# Patient Record
Sex: Male | Born: 1955
Health system: Southern US, Community
[De-identification: ages and names within clinical notes are randomized; demographics above are authoritative.]

## PROBLEM LIST (undated history)

## (undated) DIAGNOSIS — I10 Essential (primary) hypertension: Secondary | ICD-10-CM

## (undated) DIAGNOSIS — E119 Type 2 diabetes mellitus without complications: Secondary | ICD-10-CM

## (undated) DIAGNOSIS — K219 Gastro-esophageal reflux disease without esophagitis: Secondary | ICD-10-CM

## (undated) HISTORY — PX: NO PAST SURGERIES: SHX2092

---

## 2008-07-23 ENCOUNTER — Emergency Department: Payer: Self-pay | Admitting: Emergency Medicine

## 2016-10-31 DIAGNOSIS — Z23 Encounter for immunization: Secondary | ICD-10-CM | POA: Diagnosis not present

## 2016-11-27 DIAGNOSIS — H5213 Myopia, bilateral: Secondary | ICD-10-CM | POA: Diagnosis not present

## 2017-07-29 ENCOUNTER — Other Ambulatory Visit: Payer: Self-pay | Admitting: Internal Medicine

## 2017-07-29 DIAGNOSIS — R634 Abnormal weight loss: Secondary | ICD-10-CM | POA: Diagnosis not present

## 2017-07-30 DIAGNOSIS — R5381 Other malaise: Secondary | ICD-10-CM | POA: Diagnosis not present

## 2017-07-30 DIAGNOSIS — E119 Type 2 diabetes mellitus without complications: Secondary | ICD-10-CM | POA: Diagnosis not present

## 2017-07-30 DIAGNOSIS — I10 Essential (primary) hypertension: Secondary | ICD-10-CM | POA: Diagnosis not present

## 2017-07-30 DIAGNOSIS — Z125 Encounter for screening for malignant neoplasm of prostate: Secondary | ICD-10-CM | POA: Diagnosis not present

## 2017-07-31 ENCOUNTER — Ambulatory Visit
Admission: RE | Admit: 2017-07-31 | Discharge: 2017-07-31 | Disposition: A | Discharge status: Home / Self Care | Payer: BLUE CROSS/BLUE SHIELD | Source: Ambulatory Visit | Attending: Cardiology | Admitting: Cardiology

## 2017-07-31 ENCOUNTER — Ambulatory Visit
Admission: RE | Admit: 2017-07-31 | Discharge: 2017-07-31 | Disposition: A | Discharge status: Home / Self Care | Payer: BLUE CROSS/BLUE SHIELD | Source: Ambulatory Visit | Attending: Internal Medicine | Admitting: Internal Medicine

## 2017-07-31 ENCOUNTER — Other Ambulatory Visit: Payer: Self-pay | Admitting: Cardiology

## 2017-07-31 DIAGNOSIS — R634 Abnormal weight loss: Secondary | ICD-10-CM | POA: Diagnosis not present

## 2017-07-31 DIAGNOSIS — R918 Other nonspecific abnormal finding of lung field: Secondary | ICD-10-CM | POA: Insufficient documentation

## 2017-08-05 ENCOUNTER — Other Ambulatory Visit: Payer: Self-pay | Admitting: Internal Medicine

## 2017-08-05 DIAGNOSIS — R911 Solitary pulmonary nodule: Secondary | ICD-10-CM

## 2017-08-05 DIAGNOSIS — E119 Type 2 diabetes mellitus without complications: Secondary | ICD-10-CM | POA: Diagnosis not present

## 2017-08-05 DIAGNOSIS — D72819 Decreased white blood cell count, unspecified: Secondary | ICD-10-CM | POA: Diagnosis not present

## 2017-08-07 ENCOUNTER — Telehealth: Payer: Self-pay | Admitting: Gastroenterology

## 2017-08-07 NOTE — Telephone Encounter (Signed)
Patient is returning a call to schedule a colonoscopy °

## 2017-08-08 ENCOUNTER — Other Ambulatory Visit: Payer: Self-pay

## 2017-08-08 DIAGNOSIS — Z1211 Encounter for screening for malignant neoplasm of colon: Secondary | ICD-10-CM

## 2017-08-08 NOTE — Telephone Encounter (Signed)
Returned patients call for colonoscopy.  LVM for him to call back.  Thanks [employer]

## 2017-08-13 ENCOUNTER — Ambulatory Visit: Payer: BLUE CROSS/BLUE SHIELD

## 2017-08-15 DIAGNOSIS — E119 Type 2 diabetes mellitus without complications: Secondary | ICD-10-CM | POA: Diagnosis not present

## 2017-08-26 ENCOUNTER — Ambulatory Visit: Payer: BLUE CROSS/BLUE SHIELD | Admitting: Anesthesiology

## 2017-08-26 ENCOUNTER — Encounter
Admission: RE | Disposition: A | Discharge status: Home / Self Care | Payer: Self-pay | Source: Ambulatory Visit | Attending: Gastroenterology

## 2017-08-26 ENCOUNTER — Encounter: Payer: Self-pay | Admitting: *Deleted

## 2017-08-26 ENCOUNTER — Ambulatory Visit
Admission: RE | Admit: 2017-08-26 | Discharge: 2017-08-26 | Disposition: A | Discharge status: Home / Self Care | Payer: BLUE CROSS/BLUE SHIELD | Source: Ambulatory Visit | Attending: Gastroenterology | Admitting: Gastroenterology

## 2017-08-26 DIAGNOSIS — Z7984 Long term (current) use of oral hypoglycemic drugs: Secondary | ICD-10-CM | POA: Diagnosis not present

## 2017-08-26 DIAGNOSIS — D126 Benign neoplasm of colon, unspecified: Secondary | ICD-10-CM | POA: Diagnosis not present

## 2017-08-26 DIAGNOSIS — K621 Rectal polyp: Secondary | ICD-10-CM | POA: Diagnosis not present

## 2017-08-26 DIAGNOSIS — K644 Residual hemorrhoidal skin tags: Secondary | ICD-10-CM | POA: Insufficient documentation

## 2017-08-26 DIAGNOSIS — K635 Polyp of colon: Secondary | ICD-10-CM | POA: Diagnosis not present

## 2017-08-26 DIAGNOSIS — K649 Unspecified hemorrhoids: Secondary | ICD-10-CM | POA: Diagnosis not present

## 2017-08-26 DIAGNOSIS — E119 Type 2 diabetes mellitus without complications: Secondary | ICD-10-CM | POA: Diagnosis not present

## 2017-08-26 DIAGNOSIS — Z1211 Encounter for screening for malignant neoplasm of colon: Secondary | ICD-10-CM | POA: Diagnosis not present

## 2017-08-26 DIAGNOSIS — D125 Benign neoplasm of sigmoid colon: Secondary | ICD-10-CM | POA: Diagnosis not present

## 2017-08-26 HISTORY — PX: COLONOSCOPY WITH PROPOFOL: SHX5780

## 2017-08-26 LAB — GLUCOSE, CAPILLARY: Glucose-Capillary: 284 mg/dL — ABNORMAL HIGH (ref 70–99)

## 2017-08-26 SURGERY — COLONOSCOPY WITH PROPOFOL
Anesthesia: General

## 2017-08-26 MED ORDER — PROPOFOL 500 MG/50ML IV EMUL
INTRAVENOUS | Status: DC | PRN
Start: 2017-08-26 — End: 2017-08-26
  Administered 2017-08-26: 180 ug/kg/min via INTRAVENOUS

## 2017-08-26 MED ORDER — LIDOCAINE HCL (PF) 2 % IJ SOLN
INTRAMUSCULAR | Status: AC
  Filled 2017-08-26: qty 10

## 2017-08-26 MED ORDER — PHENYLEPHRINE HCL 10 MG/ML IJ SOLN
INTRAMUSCULAR | Status: DC | PRN
  Administered 2017-08-26 (×4): 100 ug via INTRAVENOUS

## 2017-08-26 MED ORDER — LIDOCAINE 2% (20 MG/ML) 5 ML SYRINGE
INTRAMUSCULAR | Status: DC | PRN
  Administered 2017-08-26: 40 mg via INTRAVENOUS

## 2017-08-26 MED ORDER — LIDOCAINE HCL URETHRAL/MUCOSAL 2 % EX GEL
CUTANEOUS | Status: AC
  Filled 2017-08-26: qty 10

## 2017-08-26 MED ORDER — SODIUM CHLORIDE 0.9 % IV SOLN
INTRAVENOUS | Status: DC
  Administered 2017-08-26 (×2): via INTRAVENOUS

## 2017-08-26 MED ORDER — FENTANYL CITRATE (PF) 100 MCG/2ML IJ SOLN
INTRAMUSCULAR | Status: AC
  Filled 2017-08-26: qty 2

## 2017-08-26 MED ORDER — PROPOFOL 500 MG/50ML IV EMUL
INTRAVENOUS | Status: AC
  Filled 2017-08-26: qty 50

## 2017-08-26 MED ORDER — EPHEDRINE SULFATE 50 MG/ML IJ SOLN
INTRAMUSCULAR | Status: AC
  Filled 2017-08-26: qty 1

## 2017-08-26 MED ORDER — FENTANYL CITRATE (PF) 100 MCG/2ML IJ SOLN
INTRAMUSCULAR | Status: DC | PRN
  Administered 2017-08-26 (×2): 50 ug via INTRAVENOUS

## 2017-08-26 MED ORDER — PROPOFOL 10 MG/ML IV BOLUS
INTRAVENOUS | Status: DC | PRN
  Administered 2017-08-26: 130 mg via INTRAVENOUS

## 2017-08-26 NOTE — Op Note (Signed)
[facility] Patient Name: Victor Arnold Procedure Date: 08/26/2017 1:54 PM MRN: [id number] Account #: [id number] Date of Birth: 1956-01-21 Admit Type: Outpatient Age: 62 Room: [facility] ENDO ROOM 1 Gender: Male Note Status: Finalized Procedure:            Colonoscopy Indications:          Screening for colorectal malignant neoplasm, This is                        the patient's first colonoscopy Providers:            Lin Landsman MD, MD Medicines:            Monitored Anesthesia Care Complications:        No immediate complications. Estimated blood loss: None. Procedure:            Pre-Anesthesia Assessment:                       - Prior to the procedure, a History and Physical was                        performed, and patient medications and allergies were                        reviewed. The patient is competent. The risks and                        benefits of the procedure and the sedation options and                        risks were discussed with the patient. All questions                        were answered and informed consent was obtained.                        Patient identification and proposed procedure were                        verified by the physician, the nurse, the                        anesthesiologist, the anesthetist and the technician in                        the pre-procedure area in the procedure room in the                        endoscopy suite. Mental Status Examination: alert and                        oriented. Airway Examination: normal oropharyngeal                        airway and neck mobility. Respiratory Examination:                        clear to auscultation. CV Examination: normal.  Prophylactic Antibiotics: The patient does not require                        prophylactic antibiotics. Prior Anticoagulants: The                        patient has taken no previous anticoagulant or                      antiplatelet agents. ASA Grade Assessment: II - A                        patient with mild systemic disease. After reviewing the                        risks and benefits, the patient was deemed in                        satisfactory condition to undergo the procedure. The                        anesthesia plan was to use monitored anesthesia care                        (MAC). Immediately prior to administration of                        medications, the patient was re-assessed for adequacy                        to receive sedatives. The heart rate, respiratory rate,                        oxygen saturations, blood pressure, adequacy of                        pulmonary ventilation, and response to care were                        monitored throughout the procedure. The physical status                        of the patient was re-assessed after the procedure.                       After obtaining informed consent, the colonoscope was                        passed under direct vision. Throughout the procedure,                        the patient's blood pressure, pulse, and oxygen                        saturations were monitored continuously. The                        Colonoscope was introduced through the anus and                        advanced to the  the cecum, identified by appendiceal                        orifice and ileocecal valve. The colonoscopy was                        performed without difficulty. The patient tolerated the                        procedure well. The quality of the bowel preparation                        was evaluated using the BBPS [facility] Bowel Preparation                        Scale) with scores of: Right Colon = 3, Transverse                        Colon = 3 and Left Colon = 3 (entire mucosa seen well                        with no residual staining, small fragments of stool or                        opaque liquid). The total BBPS score  equals 9. Findings:      Skin tags were found on perianal exam.      A 8 mm polyp was found in the distal sigmoid colon. The polyp was       pedunculated. The polyp was removed with a hot snare. Resection and       retrieval were complete.      A 6 mm polyp was found in the rectum. The polyp was sessile. The polyp       was removed with a hot snare. Resection and retrieval were complete.      External hemorrhoids were found during retroflexion. The hemorrhoids       were large. Impression:           - Perianal skin tags found on perianal exam.                       - One 8 mm polyp in the distal sigmoid colon, removed                        with a hot snare. Resected and retrieved.                       - One 6 mm polyp in the rectum, removed with a hot                        snare. Resected and retrieved.                       - External hemorrhoids. Recommendation:       - Discharge patient to home (with escort).                       - Diabetic (ADA) diet.                       -  Continue present medications.                       - Await pathology results.                       - Repeat colonoscopy in 3 - 5 years for surveillance. Procedure Code(s):    --- Professional ---                       [phone], Colonoscopy, flexible; with removal of tumor(s),                        polyp(s), or other lesion(s) by snare technique Diagnosis Code(s):    --- Professional ---                       K64.4, Residual hemorrhoidal skin tags                       Z12.11, Encounter for screening for malignant neoplasm                        of colon                       D12.5, Benign neoplasm of sigmoid colon                       K62.1, Rectal polyp CPT copyright 2017 American Medical Association. All rights reserved. The codes documented in this report are preliminary and upon coder review may  be revised to meet current compliance requirements. Dr. Ulyess Mort Lin Landsman MD, MD 08/26/2017  2:25:19 PM This report has been signed electronically. Number of Addenda: 0 Note Initiated On: 08/26/2017 1:54 PM Scope Withdrawal Time: 0 hours 17 minutes 6 seconds  Total Procedure Duration: 0 hours 21 minutes 26 seconds       [facility]

## 2017-08-26 NOTE — Transfer of Care (Signed)
Immediate Anesthesia Transfer of Care Note  Patient: Victor Arnold.  Procedure(s) Performed: COLONOSCOPY WITH PROPOFOL (N/A )  Patient Location: PACU and Endoscopy Unit  Anesthesia Type:General  Level of Consciousness: sedated  Airway & Oxygen Therapy: Patient Spontanous Breathing and Patient connected to nasal cannula oxygen  Post-op Assessment: Report given to RN and Post -op Vital signs reviewed and stable  Post vital signs: Reviewed and stable  Last Vitals:  Vitals Value Taken Time  BP 90/69 08/26/2017  2:33 PM  Temp 36.2 C 08/26/2017  2:31 PM  Pulse 62 08/26/2017  2:33 PM  Resp 6 08/26/2017  2:33 PM  SpO2 100 % 08/26/2017  2:33 PM  Vitals shown include unvalidated device data.  Last Pain:  Vitals:   08/26/17 1431  TempSrc: Tympanic  PainSc: 0-No pain         Complications: No apparent anesthesia complications

## 2017-08-26 NOTE — H&P (Signed)
  Victor Darby, MD [address]  [place]  [place], [place] [address]  Main: [phone]  Fax: [phone] Pager: [phone]  Primary Care Physician:  Cletis Athens, MD Primary Gastroenterologist:  Dr. Cephas Arnold  Pre-Procedure History & Physical: HPI:  Victor Arnold. is a 62 y.o. male is here for an colonoscopy.   History reviewed. No pertinent past medical history.  History reviewed. No pertinent surgical history.  Prior to Admission medications   Not on File    Allergies as of 08/08/2017  . (Not on File)    History reviewed. No pertinent family history.  Social History   Socioeconomic History  . Marital status: Single    Spouse name: Not on file  . Number of children: Not on file  . Years of education: Not on file  . Highest education level: Not on file  Occupational History  . Not on file  Social Needs  . Financial resource strain: Not on file  . Food insecurity:    Worry: Not on file    Inability: Not on file  . Transportation needs:    Medical: Not on file    Non-medical: Not on file  Tobacco Use  . Smoking status: Never Smoker  . Smokeless tobacco: Never Used  Substance and Sexual Activity  . Alcohol use: Not Currently  . Drug use: Not Currently  . Sexual activity: Not on file  Lifestyle  . Physical activity:    Days per week: Not on file    Minutes per session: Not on file  . Stress: Not on file  Relationships  . Social connections:    Talks on phone: Not on file    Gets together: Not on file    Attends religious service: Not on file    Active member of club or organization: Not on file    Attends meetings of clubs or organizations: Not on file    Relationship status: Not on file  . Intimate partner violence:    Fear of current or ex partner: Not on file    Emotionally abused: Not on file    Physically abused: Not on file    Forced sexual activity: Not on file  Other Topics Concern  . Not on file  Social History  Narrative  . Not on file    Review of Systems: See HPI, otherwise negative ROS  Physical Exam: BP 107/75   Pulse 93   Temp (!) 97 F (36.1 C) (Tympanic)   Resp 20   Ht 5\' 7"  (1.702 m)   Wt 183 lb (83 kg)   SpO2 100%   BMI 28.66 kg/m  General:   Alert,  pleasant and cooperative in NAD Head:  Normocephalic and atraumatic. Neck:  Supple; no masses or thyromegaly. Lungs:  Clear throughout to auscultation.    Heart:  Regular rate and rhythm. Abdomen:  Soft, nontender and nondistended. Normal bowel sounds, without guarding, and without rebound.   Neurologic:  Alert and  oriented x4;  grossly normal neurologically.  Impression/Plan: Victor Arnold. is here for an colonoscopy to be performed for colon cancer screening  Risks, benefits, limitations, and alternatives regarding  colonoscopy have been reviewed with the patient.  Questions have been answered.  All parties agreeable.   Sherri Sear, MD  08/26/2017, 1:32 PM

## 2017-08-26 NOTE — Anesthesia Preprocedure Evaluation (Signed)
Anesthesia Evaluation  Patient identified by MRN, date of birth, ID band Patient awake    Reviewed: Allergy & Precautions, H&P , NPO status , Patient's Chart, lab work & pertinent test results, reviewed documented beta blocker date and time   Airway Mallampati: III  TM Distance: >3 FB Neck ROM: full    Dental  (+) Partial Upper, Dental Advidsory Given   Pulmonary neg pulmonary ROS,           Cardiovascular Exercise Tolerance: Good negative cardio ROS       Neuro/Psych negative neurological ROS  negative psych ROS   GI/Hepatic negative GI ROS, Neg liver ROS,   Endo/Other  diabetes, Oral Hypoglycemic Agents  Renal/GU negative Renal ROS  negative genitourinary   Musculoskeletal   Abdominal   Peds  Hematology negative hematology ROS (+)   Anesthesia Other Findings History reviewed. No pertinent past medical history.   Reproductive/Obstetrics negative OB ROS                             Anesthesia Physical Anesthesia Plan  ASA: II  Anesthesia Plan: General   Post-op Pain Management:    Induction: Intravenous  PONV Risk Score and Plan: 2 and Propofol infusion and TIVA  Airway Management Planned: Nasal Cannula  Additional Equipment:   Intra-op Plan:   Post-operative Plan:   Informed Consent: I have reviewed the patients History and Physical, chart, labs and discussed the procedure including the risks, benefits and alternatives for the proposed anesthesia with the patient or authorized representative who has indicated his/her understanding and acceptance.   Dental Advisory Given  Plan Discussed with: Anesthesiologist, CRNA and Surgeon  Anesthesia Plan Comments:         Anesthesia Quick Evaluation

## 2017-08-26 NOTE — Anesthesia Post-op Follow-up Note (Signed)
Anesthesia QCDR form completed.        

## 2017-08-27 ENCOUNTER — Encounter: Payer: Self-pay | Admitting: Gastroenterology

## 2017-08-27 NOTE — Anesthesia Postprocedure Evaluation (Signed)
Anesthesia Post Note  Patient: Victor Arnold.  Procedure(s) Performed: COLONOSCOPY WITH PROPOFOL (N/A )  Patient location during evaluation: Endoscopy Anesthesia Type: General Level of consciousness: awake and alert Pain management: pain level controlled Vital Signs Assessment: post-procedure vital signs reviewed and stable Respiratory status: spontaneous breathing, nonlabored ventilation and respiratory function stable Cardiovascular status: blood pressure returned to baseline and stable Postop Assessment: no apparent nausea or vomiting Anesthetic complications: no     Last Vitals:  Vitals:   08/26/17 1451 08/26/17 1501  BP: 107/79 (!) 129/93  Pulse: 73 74  Resp: 16 15  Temp:    SpO2: 99% 100%    Last Pain:  Vitals:   08/26/17 1501  TempSrc:   PainSc: 0-No pain                 Alphonsus Sias

## 2017-08-29 ENCOUNTER — Encounter: Payer: Self-pay | Admitting: Gastroenterology

## 2017-08-29 LAB — SURGICAL PATHOLOGY

## 2017-09-11 DIAGNOSIS — K219 Gastro-esophageal reflux disease without esophagitis: Secondary | ICD-10-CM | POA: Diagnosis not present

## 2017-09-11 DIAGNOSIS — E785 Hyperlipidemia, unspecified: Secondary | ICD-10-CM | POA: Diagnosis not present

## 2017-09-11 DIAGNOSIS — R10819 Abdominal tenderness, unspecified site: Secondary | ICD-10-CM | POA: Diagnosis not present

## 2017-09-11 DIAGNOSIS — E119 Type 2 diabetes mellitus without complications: Secondary | ICD-10-CM | POA: Diagnosis not present

## 2017-09-25 DIAGNOSIS — E785 Hyperlipidemia, unspecified: Secondary | ICD-10-CM | POA: Diagnosis not present

## 2017-09-25 DIAGNOSIS — K219 Gastro-esophageal reflux disease without esophagitis: Secondary | ICD-10-CM | POA: Diagnosis not present

## 2017-09-25 DIAGNOSIS — E119 Type 2 diabetes mellitus without complications: Secondary | ICD-10-CM | POA: Diagnosis not present

## 2017-09-25 DIAGNOSIS — R10819 Abdominal tenderness, unspecified site: Secondary | ICD-10-CM | POA: Diagnosis not present

## 2017-10-20 DIAGNOSIS — E785 Hyperlipidemia, unspecified: Secondary | ICD-10-CM | POA: Diagnosis not present

## 2017-10-20 DIAGNOSIS — B0229 Other postherpetic nervous system involvement: Secondary | ICD-10-CM | POA: Diagnosis not present

## 2017-10-20 DIAGNOSIS — E1139 Type 2 diabetes mellitus with other diabetic ophthalmic complication: Secondary | ICD-10-CM | POA: Diagnosis not present

## 2017-10-20 DIAGNOSIS — B029 Zoster without complications: Secondary | ICD-10-CM | POA: Diagnosis not present

## 2017-10-27 DIAGNOSIS — H25041 Posterior subcapsular polar age-related cataract, right eye: Secondary | ICD-10-CM | POA: Diagnosis not present

## 2017-10-28 ENCOUNTER — Encounter: Payer: Self-pay | Admitting: *Deleted

## 2017-11-04 ENCOUNTER — Ambulatory Visit: Payer: BLUE CROSS/BLUE SHIELD | Admitting: Certified Registered Nurse Anesthetist

## 2017-11-04 ENCOUNTER — Encounter
Admission: RE | Disposition: A | Discharge status: Home / Self Care | Payer: Self-pay | Source: Ambulatory Visit | Attending: Ophthalmology

## 2017-11-04 ENCOUNTER — Ambulatory Visit
Admission: RE | Admit: 2017-11-04 | Discharge: 2017-11-04 | Disposition: A | Discharge status: Home / Self Care | Payer: BLUE CROSS/BLUE SHIELD | Source: Ambulatory Visit | Attending: Ophthalmology | Admitting: Ophthalmology

## 2017-11-04 ENCOUNTER — Other Ambulatory Visit: Payer: Self-pay

## 2017-11-04 DIAGNOSIS — E119 Type 2 diabetes mellitus without complications: Secondary | ICD-10-CM | POA: Diagnosis not present

## 2017-11-04 DIAGNOSIS — I10 Essential (primary) hypertension: Secondary | ICD-10-CM | POA: Insufficient documentation

## 2017-11-04 DIAGNOSIS — H25041 Posterior subcapsular polar age-related cataract, right eye: Secondary | ICD-10-CM | POA: Diagnosis not present

## 2017-11-04 DIAGNOSIS — H2511 Age-related nuclear cataract, right eye: Secondary | ICD-10-CM | POA: Diagnosis not present

## 2017-11-04 DIAGNOSIS — E7800 Pure hypercholesterolemia, unspecified: Secondary | ICD-10-CM | POA: Diagnosis not present

## 2017-11-04 DIAGNOSIS — Z7984 Long term (current) use of oral hypoglycemic drugs: Secondary | ICD-10-CM | POA: Insufficient documentation

## 2017-11-04 DIAGNOSIS — Z79899 Other long term (current) drug therapy: Secondary | ICD-10-CM | POA: Diagnosis not present

## 2017-11-04 DIAGNOSIS — K219 Gastro-esophageal reflux disease without esophagitis: Secondary | ICD-10-CM | POA: Insufficient documentation

## 2017-11-04 HISTORY — PX: CATARACT EXTRACTION W/PHACO: SHX586

## 2017-11-04 HISTORY — DX: Gastro-esophageal reflux disease without esophagitis: K21.9

## 2017-11-04 HISTORY — DX: Type 2 diabetes mellitus without complications: E11.9

## 2017-11-04 HISTORY — DX: Essential (primary) hypertension: I10

## 2017-11-04 LAB — GLUCOSE, CAPILLARY
Glucose-Capillary: 327 mg/dL — ABNORMAL HIGH (ref 70–99)
Glucose-Capillary: 304 mg/dL — ABNORMAL HIGH (ref 70–99)

## 2017-11-04 SURGERY — PHACOEMULSIFICATION, CATARACT, WITH IOL INSERTION
Anesthesia: Monitor Anesthesia Care | Site: Eye | Laterality: Right

## 2017-11-04 MED ORDER — TETRACAINE HCL 0.5 % OP SOLN
OPHTHALMIC | Status: AC
  Administered 2017-11-04: 1 [drp] via OPHTHALMIC
  Filled 2017-11-04: qty 4

## 2017-11-04 MED ORDER — SODIUM CHLORIDE 0.9 % IV SOLN
INTRAVENOUS | Status: DC
  Administered 2017-11-04: 09:00:00 via INTRAVENOUS

## 2017-11-04 MED ORDER — MOXIFLOXACIN HCL 0.5 % OP SOLN: 1.0000 [drp] | OPHTHALMIC | Status: DC | PRN

## 2017-11-04 MED ORDER — EPINEPHRINE PF 1 MG/ML IJ SOLN
INTRAOCULAR | Status: DC | PRN
  Administered 2017-11-04: 1 mL via OPHTHALMIC

## 2017-11-04 MED ORDER — NA CHONDROIT SULF-NA HYALURON 40-17 MG/ML IO SOLN
INTRAOCULAR | Status: DC | PRN
  Administered 2017-11-04: 1 mL via INTRAOCULAR

## 2017-11-04 MED ORDER — TETRACAINE HCL 0.5 % OP SOLN
1.0000 [drp] | OPHTHALMIC | Status: AC | PRN
  Administered 2017-11-04 (×3): 1 [drp] via OPHTHALMIC

## 2017-11-04 MED ORDER — CARBACHOL 0.01 % IO SOLN
INTRAOCULAR | Status: DC | PRN
  Administered 2017-11-04: 0.5 mL via INTRAOCULAR

## 2017-11-04 MED ORDER — POVIDONE-IODINE 5 % OP SOLN
OPHTHALMIC | Status: DC | PRN
  Administered 2017-11-04: 1 via OPHTHALMIC

## 2017-11-04 MED ORDER — LIDOCAINE HCL (PF) 4 % IJ SOLN
INTRAOCULAR | Status: DC | PRN
  Administered 2017-11-04: 2 mL via OPHTHALMIC

## 2017-11-04 MED ORDER — MOXIFLOXACIN HCL 0.5 % OP SOLN
OPHTHALMIC | Status: AC
  Filled 2017-11-04: qty 3

## 2017-11-04 MED ORDER — MIDAZOLAM HCL 2 MG/2ML IJ SOLN
INTRAMUSCULAR | Status: DC | PRN
  Administered 2017-11-04 (×2): 1 mg via INTRAVENOUS

## 2017-11-04 MED ORDER — MOXIFLOXACIN HCL 0.5 % OP SOLN
OPHTHALMIC | Status: DC | PRN
  Administered 2017-11-04: 0.2 mL via OPHTHALMIC

## 2017-11-04 MED ORDER — MIDAZOLAM HCL 2 MG/2ML IJ SOLN
INTRAMUSCULAR | Status: AC
  Filled 2017-11-04: qty 2

## 2017-11-04 MED ORDER — ARMC OPHTHALMIC DILATING DROPS
1.0000 "application " | OPHTHALMIC | Status: AC
  Administered 2017-11-04: 1 via OPHTHALMIC
  Administered 2017-11-04: 09:00:00 via OPHTHALMIC
  Administered 2017-11-04: 1 via OPHTHALMIC

## 2017-11-04 MED ORDER — ARMC OPHTHALMIC DILATING DROPS
OPHTHALMIC | Status: AC
  Filled 2017-11-04: qty 0.5

## 2017-11-04 MED ORDER — INSULIN ASPART 100 UNIT/ML ~~LOC~~ SOLN
SUBCUTANEOUS | Status: AC
  Filled 2017-11-04: qty 1

## 2017-11-04 MED ORDER — INSULIN ASPART 100 UNIT/ML ~~LOC~~ SOLN
5.0000 [IU] | Freq: Once | SUBCUTANEOUS | Status: AC
  Administered 2017-11-04: 5 [IU] via SUBCUTANEOUS

## 2017-11-04 MED ORDER — INSULIN ASPART 100 UNIT/ML ~~LOC~~ SOLN
SUBCUTANEOUS | Status: AC
  Administered 2017-11-04: 5 [IU] via SUBCUTANEOUS
  Filled 2017-11-04: qty 1

## 2017-11-04 SURGICAL SUPPLY — 16 items
GLOVE BIO SURGEON STRL SZ8 (GLOVE) ×3 IMPLANT
GLOVE BIOGEL M 6.5 STRL (GLOVE) ×3 IMPLANT
GLOVE SURG LX 8.0 MICRO (GLOVE) ×2
GLOVE SURG LX STRL 8.0 MICRO (GLOVE) ×1 IMPLANT
GOWN STRL REUS W/ TWL LRG LVL3 (GOWN DISPOSABLE) ×2 IMPLANT
GOWN STRL REUS W/TWL LRG LVL3 (GOWN DISPOSABLE) ×4
LABEL CATARACT MEDS ST (LABEL) ×3 IMPLANT
LENS IOL TECNIS ITEC 19.0 (Intraocular Lens) ×2 IMPLANT
PACK CATARACT (MISCELLANEOUS) ×3 IMPLANT
PACK CATARACT BRASINGTON LX (MISCELLANEOUS) ×3 IMPLANT
PACK EYE AFTER SURG (MISCELLANEOUS) ×3 IMPLANT
SOL BSS BAG (MISCELLANEOUS) ×3
SOLUTION BSS BAG (MISCELLANEOUS) ×1 IMPLANT
SYR 5ML LL (SYRINGE) ×3 IMPLANT
WATER STERILE IRR 250ML POUR (IV SOLUTION) ×3 IMPLANT
WIPE NON LINTING 3.25X3.25 (MISCELLANEOUS) ×3 IMPLANT

## 2017-11-04 NOTE — Anesthesia Postprocedure Evaluation (Signed)
Anesthesia Post Note  Patient: Jo Cerone.  Procedure(s) Performed: CATARACT EXTRACTION PHACO AND INTRAOCULAR LENS PLACEMENT (IOC) (Right Eye)  Patient location during evaluation: Short Stay Anesthesia Type: MAC Level of consciousness: awake and alert, oriented and patient cooperative Pain management: satisfactory to patient Vital Signs Assessment: post-procedure vital signs reviewed and stable Respiratory status: spontaneous breathing, respiratory function stable and nonlabored ventilation Cardiovascular status: blood pressure returned to baseline and stable Postop Assessment: no headache and no apparent nausea or vomiting Anesthetic complications: no     Last Vitals:  Vitals:   11/04/17 0826 11/04/17 0945  BP: (!) 131/91 115/73  Pulse: 84 90  Resp: 18 16  Temp: 36.4 C (!) 36.3 C  SpO2: 100% 100%    Last Pain:  Vitals:   11/04/17 0945  TempSrc:   PainSc: 0-No pain                 Eben Burow

## 2017-11-04 NOTE — Discharge Instructions (Signed)
Eye Surgery Discharge Instructions    Expect mild scratchy sensation or mild soreness. DO NOT RUB YOUR EYE!  The day of surgery:  Minimal physical activity, but bed rest is not required  No reading, computer work, or close hand work  No bending, lifting, or straining.  May watch TV  For 24 hours:  No driving, legal decisions, or alcoholic beverages  Safety precautions  Eat anything you prefer: It is better to start with liquids, then soup then solid foods.  _____ Eye patch should be worn until postoperative exam tomorrow.  ____ Solar shield eyeglasses should be worn for comfort in the sunlight/patch while sleeping  Resume all regular medications including aspirin or Coumadin if these were discontinued prior to surgery. You may shower, bathe, shave, or wash your hair. Tylenol may be taken for mild discomfort.  Call your doctor if you experience significant pain, nausea, or vomiting, fever > 101 or other signs of infection. [phone] or [phone] Specific instructions:  Follow-up Information    Birder Robson, MD Follow up.   Specialty:  Ophthalmology Why:  October 16 at 10:45am Contact information: [address] [place] [address] [phone]

## 2017-11-04 NOTE — OR Nursing (Signed)
Dr Randa Lynn  Made aware of pts BS orders received 5 units of insulin given.

## 2017-11-04 NOTE — Op Note (Signed)
PREOPERATIVE DIAGNOSIS:  Nuclear sclerotic cataract of the right eye.   POSTOPERATIVE DIAGNOSIS:  nuclear sclerotic cataract right eye   OPERATIVE PROCEDURE: Procedure(s): CATARACT EXTRACTION PHACO AND INTRAOCULAR LENS PLACEMENT (IOC)   SURGEON:  Birder Robson, MD.   ANESTHESIA:  Anesthesiologist: Emmie Niemann, MD CRNA: Eben Burow, CRNA  1.      Managed anesthesia care. 2.      0.14ml of Shugarcaine was instilled in the eye following the paracentesis.   COMPLICATIONS:  None.   TECHNIQUE:   Stop and chop   DESCRIPTION OF PROCEDURE:  The patient was examined and consented in the preoperative holding area where the aforementioned topical anesthesia was applied to the right eye and then brought back to the Operating Room where the right eye was prepped and draped in the usual sterile ophthalmic fashion and a lid speculum was placed. A paracentesis was created with the side port blade and the anterior chamber was filled with viscoelastic. A near clear corneal incision was performed with the steel keratome. A continuous curvilinear capsulorrhexis was performed with a cystotome followed by the capsulorrhexis forceps. Hydrodissection and hydrodelineation were carried out with BSS on a blunt cannula. The lens was removed in a stop and chop  technique and the remaining cortical material was removed with the irrigation-aspiration handpiece. The capsular bag was inflated with viscoelastic and the Technis [id number]  lens was placed in the capsular bag without complication. The remaining viscoelastic was removed from the eye with the irrigation-aspiration handpiece. The wounds were hydrated. The anterior chamber was flushed with Miostat and the eye was inflated to physiologic pressure. 0.69ml of Vigamox was placed in the anterior chamber. The wounds were found to be water tight. The eye was dressed with Vigamox. The patient was given protective glasses to wear throughout the day and a shield with which to  sleep tonight. The patient was also given drops with which to begin a drop regimen today and will follow-up with me in one day. Implant Name Type Inv. Item Serial No. Manufacturer Lot No. LRB No. Used  LENS IOL DIOP 19.0 - [id number] Intraocular Lens LENS IOL DIOP 19.0 [id number] AMO  Right 1   Procedure(s) with comments: CATARACT EXTRACTION PHACO AND INTRAOCULAR LENS PLACEMENT (IOC) (Right) - [place]  01:46 CDE 9.00 Fluid pack lot # [id number]  Electronically signed: Birder Robson 11/04/2017 9:43 AM

## 2017-11-04 NOTE — OR Nursing (Signed)
Discharge instructions discussed with pt and wife. Both voice understanding. 

## 2017-11-04 NOTE — Anesthesia Post-op Follow-up Note (Signed)
Anesthesia QCDR form completed.        

## 2017-11-04 NOTE — Anesthesia Preprocedure Evaluation (Signed)
Anesthesia Evaluation  Patient identified by MRN, date of birth, ID band Patient awake    Reviewed: Allergy & Precautions, NPO status , Patient's Chart, lab work & pertinent test results  History of Anesthesia Complications Negative for: history of anesthetic complications  Airway Mallampati: II  TM Distance: >3 FB Neck ROM: Full    Dental  (+) Partial Upper   Pulmonary neg sleep apnea, neg COPD,    breath sounds clear to auscultation- rhonchi (-) wheezing      Cardiovascular hypertension, Pt. on medications (-) CAD, (-) Past MI, (-) Cardiac Stents and (-) CABG  Rhythm:Regular Rate:Normal - Systolic murmurs and - Diastolic murmurs    Neuro/Psych negative neurological ROS  negative psych ROS   GI/Hepatic Neg liver ROS, GERD  ,  Endo/Other  diabetes, Oral Hypoglycemic Agents  Renal/GU negative Renal ROS     Musculoskeletal negative musculoskeletal ROS (+)   Abdominal (+) - obese,   Peds  Hematology negative hematology ROS (+)   Anesthesia Other Findings Past Medical History: No date: Diabetes mellitus without complication (HCC) No date: GERD (gastroesophageal reflux disease) No date: Hypertension   Reproductive/Obstetrics                             Anesthesia Physical Anesthesia Plan  ASA: II  Anesthesia Plan: MAC   Post-op Pain Management:    Induction: Intravenous  PONV Risk Score and Plan: 1 and Midazolam  Airway Management Planned: Natural Airway  Additional Equipment:   Intra-op Plan:   Post-operative Plan:   Informed Consent: I have reviewed the patients History and Physical, chart, labs and discussed the procedure including the risks, benefits and alternatives for the proposed anesthesia with the patient or authorized representative who has indicated his/her understanding and acceptance.     Plan Discussed with: CRNA and Anesthesiologist  Anesthesia Plan  Comments:         Anesthesia Quick Evaluation

## 2017-11-04 NOTE — H&P (Signed)
All labs reviewed. Abnormal studies sent to patients PCP when indicated.  Previous H&P reviewed, patient examined, there are NO CHANGES.  Victor Rankin Porfilio10/15/20199:03 AM

## 2017-11-04 NOTE — Transfer of Care (Signed)
Immediate Anesthesia Transfer of Care Note  Patient: Victor Arnold.  Procedure(s) Performed: CATARACT EXTRACTION PHACO AND INTRAOCULAR LENS PLACEMENT (IOC) (Right Eye)  Patient Location: Short Stay  Anesthesia Type:MAC  Level of Consciousness: awake, alert , oriented and patient cooperative  Airway & Oxygen Therapy: Patient Spontanous Breathing  Post-op Assessment: Report given to RN and Post -op Vital signs reviewed and stable  Post vital signs: Reviewed and stable  Last Vitals:  Vitals Value Taken Time  BP 115/73 11/04/2017  9:45 AM  Temp 36.3 C 11/04/2017  9:45 AM  Pulse 90 11/04/2017  9:45 AM  Resp 16 11/04/2017  9:45 AM  SpO2 100 % 11/04/2017  9:45 AM    Last Pain:  Vitals:   11/04/17 0945  TempSrc:   PainSc: 0-No pain         Complications: No apparent anesthesia complications

## 2017-11-20 DIAGNOSIS — K219 Gastro-esophageal reflux disease without esophagitis: Secondary | ICD-10-CM | POA: Diagnosis not present

## 2017-11-20 DIAGNOSIS — E119 Type 2 diabetes mellitus without complications: Secondary | ICD-10-CM | POA: Diagnosis not present

## 2017-11-20 DIAGNOSIS — E785 Hyperlipidemia, unspecified: Secondary | ICD-10-CM | POA: Diagnosis not present

## 2017-11-20 DIAGNOSIS — R10819 Abdominal tenderness, unspecified site: Secondary | ICD-10-CM | POA: Diagnosis not present

## 2017-11-21 DIAGNOSIS — H2512 Age-related nuclear cataract, left eye: Secondary | ICD-10-CM | POA: Diagnosis not present

## 2017-11-26 ENCOUNTER — Encounter: Payer: Self-pay | Admitting: *Deleted

## 2017-12-02 ENCOUNTER — Other Ambulatory Visit: Payer: Self-pay

## 2017-12-02 ENCOUNTER — Ambulatory Visit: Payer: BLUE CROSS/BLUE SHIELD | Admitting: Anesthesiology

## 2017-12-02 ENCOUNTER — Encounter: Payer: Self-pay | Admitting: Anesthesiology

## 2017-12-02 ENCOUNTER — Encounter
Admission: RE | Disposition: A | Discharge status: Home / Self Care | Payer: Self-pay | Source: Ambulatory Visit | Attending: Ophthalmology

## 2017-12-02 ENCOUNTER — Ambulatory Visit
Admission: RE | Admit: 2017-12-02 | Discharge: 2017-12-02 | Disposition: A | Discharge status: Home / Self Care | Payer: BLUE CROSS/BLUE SHIELD | Source: Ambulatory Visit | Attending: Ophthalmology | Admitting: Ophthalmology

## 2017-12-02 DIAGNOSIS — H2512 Age-related nuclear cataract, left eye: Secondary | ICD-10-CM | POA: Diagnosis not present

## 2017-12-02 DIAGNOSIS — Z9841 Cataract extraction status, right eye: Secondary | ICD-10-CM | POA: Diagnosis not present

## 2017-12-02 DIAGNOSIS — E7800 Pure hypercholesterolemia, unspecified: Secondary | ICD-10-CM | POA: Diagnosis not present

## 2017-12-02 DIAGNOSIS — Z7984 Long term (current) use of oral hypoglycemic drugs: Secondary | ICD-10-CM | POA: Diagnosis not present

## 2017-12-02 DIAGNOSIS — K219 Gastro-esophageal reflux disease without esophagitis: Secondary | ICD-10-CM | POA: Insufficient documentation

## 2017-12-02 DIAGNOSIS — E1136 Type 2 diabetes mellitus with diabetic cataract: Secondary | ICD-10-CM | POA: Diagnosis not present

## 2017-12-02 DIAGNOSIS — I10 Essential (primary) hypertension: Secondary | ICD-10-CM | POA: Insufficient documentation

## 2017-12-02 DIAGNOSIS — E119 Type 2 diabetes mellitus without complications: Secondary | ICD-10-CM | POA: Diagnosis not present

## 2017-12-02 DIAGNOSIS — Z79899 Other long term (current) drug therapy: Secondary | ICD-10-CM | POA: Insufficient documentation

## 2017-12-02 HISTORY — PX: CATARACT EXTRACTION W/PHACO: SHX586

## 2017-12-02 LAB — GLUCOSE, CAPILLARY: GLUCOSE-CAPILLARY: 241 mg/dL — AB (ref 70–99)

## 2017-12-02 SURGERY — PHACOEMULSIFICATION, CATARACT, WITH IOL INSERTION
Anesthesia: Monitor Anesthesia Care | Site: Eye | Laterality: Left

## 2017-12-02 MED ORDER — FENTANYL CITRATE (PF) 100 MCG/2ML IJ SOLN
INTRAMUSCULAR | Status: DC | PRN
  Administered 2017-12-02: 25 ug via INTRAVENOUS
  Administered 2017-12-02: 50 ug via INTRAVENOUS
  Administered 2017-12-02: 25 ug via INTRAVENOUS

## 2017-12-02 MED ORDER — SODIUM CHLORIDE 0.9 % IV SOLN
INTRAVENOUS | Status: DC
  Administered 2017-12-02: 12:00:00 via INTRAVENOUS

## 2017-12-02 MED ORDER — NA CHONDROIT SULF-NA HYALURON 40-17 MG/ML IO SOLN
INTRAOCULAR | Status: DC | PRN
  Administered 2017-12-02: 1 mL via INTRAOCULAR

## 2017-12-02 MED ORDER — TETRACAINE HCL 0.5 % OP SOLN
1.0000 [drp] | OPHTHALMIC | Status: DC | PRN
  Administered 2017-12-02: 1 [drp] via OPHTHALMIC

## 2017-12-02 MED ORDER — POVIDONE-IODINE 5 % OP SOLN
OPHTHALMIC | Status: AC
  Filled 2017-12-02: qty 30

## 2017-12-02 MED ORDER — EPINEPHRINE PF 1 MG/ML IJ SOLN
INTRAOCULAR | Status: DC | PRN
  Administered 2017-12-02: 12:00:00 via OPHTHALMIC

## 2017-12-02 MED ORDER — FENTANYL CITRATE (PF) 100 MCG/2ML IJ SOLN
INTRAMUSCULAR | Status: AC
  Filled 2017-12-02: qty 2

## 2017-12-02 MED ORDER — MOXIFLOXACIN HCL 0.5 % OP SOLN
OPHTHALMIC | Status: AC
  Filled 2017-12-02: qty 3

## 2017-12-02 MED ORDER — ARMC OPHTHALMIC DILATING DROPS
1.0000 "application " | OPHTHALMIC | Status: AC
  Administered 2017-12-02 (×3): 1 via OPHTHALMIC

## 2017-12-02 MED ORDER — EPINEPHRINE PF 1 MG/ML IJ SOLN
INTRAMUSCULAR | Status: AC
  Filled 2017-12-02: qty 1

## 2017-12-02 MED ORDER — POVIDONE-IODINE 5 % OP SOLN
OPHTHALMIC | Status: DC | PRN
  Administered 2017-12-02: 1 via OPHTHALMIC

## 2017-12-02 MED ORDER — MOXIFLOXACIN HCL 0.5 % OP SOLN: 1.0000 [drp] | OPHTHALMIC | Status: DC | PRN

## 2017-12-02 MED ORDER — LIDOCAINE HCL (PF) 4 % IJ SOLN
INTRAMUSCULAR | Status: AC
  Filled 2017-12-02: qty 5

## 2017-12-02 MED ORDER — ARMC OPHTHALMIC DILATING DROPS
OPHTHALMIC | Status: AC
  Administered 2017-12-02: 1 via OPHTHALMIC
  Filled 2017-12-02: qty 0.5

## 2017-12-02 MED ORDER — NA CHONDROIT SULF-NA HYALURON 40-17 MG/ML IO SOLN
INTRAOCULAR | Status: AC
  Filled 2017-12-02: qty 1

## 2017-12-02 MED ORDER — MOXIFLOXACIN HCL 0.5 % OP SOLN
OPHTHALMIC | Status: DC | PRN
  Administered 2017-12-02: 0.2 mL via OPHTHALMIC

## 2017-12-02 MED ORDER — TETRACAINE HCL 0.5 % OP SOLN
OPHTHALMIC | Status: AC
  Administered 2017-12-02: 1 [drp] via OPHTHALMIC
  Filled 2017-12-02: qty 4

## 2017-12-02 MED ORDER — LIDOCAINE HCL (PF) 4 % IJ SOLN
INTRAOCULAR | Status: DC | PRN
  Administered 2017-12-02: 4 mL via OPHTHALMIC

## 2017-12-02 MED ORDER — CARBACHOL 0.01 % IO SOLN
INTRAOCULAR | Status: DC | PRN
  Administered 2017-12-02: 0.5 mL via INTRAOCULAR

## 2017-12-02 SURGICAL SUPPLY — 16 items
GLOVE BIO SURGEON STRL SZ8 (GLOVE) ×3 IMPLANT
GLOVE BIOGEL M 6.5 STRL (GLOVE) ×3 IMPLANT
GLOVE SURG LX 8.0 MICRO (GLOVE) ×2
GLOVE SURG LX STRL 8.0 MICRO (GLOVE) ×1 IMPLANT
GOWN STRL REUS W/ TWL LRG LVL3 (GOWN DISPOSABLE) ×2 IMPLANT
GOWN STRL REUS W/TWL LRG LVL3 (GOWN DISPOSABLE) ×4
LABEL CATARACT MEDS ST (LABEL) ×3 IMPLANT
LENS IOL TECNIS ITEC 19.0 (Intraocular Lens) ×2 IMPLANT
PACK CATARACT (MISCELLANEOUS) ×3 IMPLANT
PACK CATARACT BRASINGTON LX (MISCELLANEOUS) ×3 IMPLANT
PACK EYE AFTER SURG (MISCELLANEOUS) ×3 IMPLANT
SOL BSS BAG (MISCELLANEOUS) ×3
SOLUTION BSS BAG (MISCELLANEOUS) ×1 IMPLANT
SYR 5ML LL (SYRINGE) ×3 IMPLANT
WATER STERILE IRR 250ML POUR (IV SOLUTION) ×3 IMPLANT
WIPE NON LINTING 3.25X3.25 (MISCELLANEOUS) ×3 IMPLANT

## 2017-12-02 NOTE — Anesthesia Post-op Follow-up Note (Signed)
Anesthesia QCDR form completed.        

## 2017-12-02 NOTE — H&P (Signed)
All labs reviewed. Abnormal studies sent to patients PCP when indicated.  Previous H&P reviewed, patient examined, there are NO CHANGES.  Victor Gracy Porfilio11/12/201912:06 PM

## 2017-12-02 NOTE — Anesthesia Preprocedure Evaluation (Signed)
Anesthesia Evaluation  Patient identified by MRN, date of birth, ID band Patient awake    Reviewed: Allergy & Precautions, NPO status , Patient's Chart, lab work & pertinent test results, reviewed documented beta blocker date and time   Airway Mallampati: II  TM Distance: >3 FB     Dental  (+) Chipped   Pulmonary           Cardiovascular hypertension, Pt. on medications      Neuro/Psych    GI/Hepatic   Endo/Other  diabetes, Type 2  Renal/GU      Musculoskeletal   Abdominal   Peds  Hematology   Anesthesia Other Findings   Reproductive/Obstetrics                             Anesthesia Physical Anesthesia Plan  ASA: III  Anesthesia Plan: MAC   Post-op Pain Management:    Induction:   PONV Risk Score and Plan:   Airway Management Planned:   Additional Equipment:   Intra-op Plan:   Post-operative Plan:   Informed Consent: I have reviewed the patients History and Physical, chart, labs and discussed the procedure including the risks, benefits and alternatives for the proposed anesthesia with the patient or authorized representative who has indicated his/her understanding and acceptance.     Plan Discussed with: CRNA  Anesthesia Plan Comments:         Anesthesia Quick Evaluation

## 2017-12-02 NOTE — Transfer of Care (Signed)
Immediate Anesthesia Transfer of Care Note  Patient: Victor Arnold.  Procedure(s) Performed: CATARACT EXTRACTION PHACO AND INTRAOCULAR LENS PLACEMENT (IOC) (Left Eye)  Patient Location: PACU  Anesthesia Type:MAC  Level of Consciousness: awake, alert  and oriented  Airway & Oxygen Therapy: Patient Spontanous Breathing  Post-op Assessment: Report given to RN and Post -op Vital signs reviewed and stable  Post vital signs: Reviewed and stable  Last Vitals:  Vitals Value Taken Time  BP 121/77 12/02/2017 12:38 PM  Temp 36.2 C 12/02/2017 12:38 PM  Pulse 86 12/02/2017 12:38 PM  Resp 16 12/02/2017 12:38 PM  SpO2 100 % 12/02/2017 12:38 PM    Last Pain:  Vitals:   12/02/17 1238  TempSrc: Temporal  PainSc: 0-No pain         Complications: No apparent anesthesia complications

## 2017-12-02 NOTE — Op Note (Signed)
PREOPERATIVE DIAGNOSIS:  Nuclear sclerotic cataract of the left eye.   POSTOPERATIVE DIAGNOSIS:  Nuclear sclerotic cataract of the left eye.   OPERATIVE PROCEDURE: Procedure(s): CATARACT EXTRACTION PHACO AND INTRAOCULAR LENS PLACEMENT (IOC)   SURGEON:  Birder Robson, MD.   ANESTHESIA:  Anesthesiologist: Gunnar Bulla, MD CRNA: Johnna Acosta, CRNA  1.      Managed anesthesia care. 2.     0.59ml of Shugarcaine was instilled following the paracentesis   COMPLICATIONS:  None.   TECHNIQUE:   Stop and chop   DESCRIPTION OF PROCEDURE:  The patient was examined and consented in the preoperative holding area where the aforementioned topical anesthesia was applied to the left eye and then brought back to the Operating Room where the left eye was prepped and draped in the usual sterile ophthalmic fashion and a lid speculum was placed. A paracentesis was created with the side port blade and the anterior chamber was filled with viscoelastic. A near clear corneal incision was performed with the steel keratome. A continuous curvilinear capsulorrhexis was performed with a cystotome followed by the capsulorrhexis forceps. Hydrodissection and hydrodelineation were carried out with BSS on a blunt cannula. The lens was removed in a stop and chop  technique and the remaining cortical material was removed with the irrigation-aspiration handpiece. The capsular bag was inflated with viscoelastic and the Technis [id number] lens was placed in the capsular bag without complication. The remaining viscoelastic was removed from the eye with the irrigation-aspiration handpiece. The wounds were hydrated. The anterior chamber was flushed with Miostat and the eye was inflated to physiologic pressure. 0.51ml Vigamox was placed in the anterior chamber. The wounds were found to be water tight. The eye was dressed with Vigamox. The patient was given protective glasses to wear throughout the day and a shield with which to sleep  tonight. The patient was also given drops with which to begin a drop regimen today and will follow-up with me in one day. Implant Name Type Inv. Item Serial No. Manufacturer Lot No. LRB No. Used  LENS IOL DIOP 19.0 - [id number] Intraocular Lens LENS IOL DIOP 19.0 [id number] AMO  Left 1    Procedure(s) with comments: CATARACT EXTRACTION PHACO AND INTRAOCULAR LENS PLACEMENT (IOC) (Left) - [place] 01:26.9 CDE 9.19 Fluid pack lot # [id number]  Electronically signed: Birder Robson 12/02/2017 12:36 PM

## 2017-12-02 NOTE — Anesthesia Procedure Notes (Signed)
Procedure Name: MAC Date/Time: 12/02/2017 12:14 PM Performed by: Johnna Acosta, CRNA Pre-anesthesia Checklist: Patient identified, Emergency Drugs available, Suction available, Patient being monitored and Timeout performed Patient Re-evaluated:Patient Re-evaluated prior to induction Oxygen Delivery Method: Nasal cannula

## 2017-12-02 NOTE — Anesthesia Postprocedure Evaluation (Signed)
Anesthesia Post Note  Patient: Jaishon Krisher.  Procedure(s) Performed: CATARACT EXTRACTION PHACO AND INTRAOCULAR LENS PLACEMENT (IOC) (Left Eye)  Patient location during evaluation: PACU Anesthesia Type: MAC Level of consciousness: awake and alert Pain management: pain level controlled Vital Signs Assessment: post-procedure vital signs reviewed and stable Respiratory status: spontaneous breathing, nonlabored ventilation, respiratory function stable and patient connected to nasal cannula oxygen Cardiovascular status: stable and blood pressure returned to baseline Postop Assessment: no apparent nausea or vomiting Anesthetic complications: no     Last Vitals:  Vitals:   12/02/17 1101 12/02/17 1238  BP: (!) 144/90 121/77  Pulse: 90 86  Resp: 16 16  Temp: (!) 36.2 C (!) 36.2 C  SpO2: 100% 100%    Last Pain:  Vitals:   12/02/17 1238  TempSrc: Temporal  PainSc: 0-No pain                 Kaydan Wilhoite S

## 2017-12-02 NOTE — Discharge Instructions (Signed)
Eye Surgery Discharge Instructions    Expect mild scratchy sensation or mild soreness. DO NOT RUB YOUR EYE!  The day of surgery:  Minimal physical activity, but bed rest is not required  No reading, computer work, or close hand work  No bending, lifting, or straining.  May watch TV  For 24 hours:  No driving, legal decisions, or alcoholic beverages  Safety precautions  Eat anything you prefer: It is better to start with liquids, then soup then solid foods.  _____ Eye patch should be worn until postoperative exam tomorrow.  ____ Solar shield eyeglasses should be worn for comfort in the sunlight/patch while sleeping  Resume all regular medications including aspirin or Coumadin if these were discontinued prior to surgery. You may shower, bathe, shave, or wash your hair. Tylenol may be taken for mild discomfort.  Call your doctor if you experience significant pain, nausea, or vomiting, fever > 101 or other signs of infection. [phone] or [phone] Specific instructions:  Follow-up Information    Birder Robson, MD Follow up on 12/03/2017.   Specialty:  Ophthalmology Why:  appointment time @ 8:10 AM Contact information: [place] [address] [phone]

## 2017-12-03 ENCOUNTER — Encounter: Payer: Self-pay | Admitting: Ophthalmology

## 2017-12-04 DIAGNOSIS — E785 Hyperlipidemia, unspecified: Secondary | ICD-10-CM | POA: Diagnosis not present

## 2017-12-04 DIAGNOSIS — E1139 Type 2 diabetes mellitus with other diabetic ophthalmic complication: Secondary | ICD-10-CM | POA: Diagnosis not present

## 2017-12-04 DIAGNOSIS — B0229 Other postherpetic nervous system involvement: Secondary | ICD-10-CM | POA: Diagnosis not present

## 2017-12-04 DIAGNOSIS — E11628 Type 2 diabetes mellitus with other skin complications: Secondary | ICD-10-CM | POA: Diagnosis not present

## 2018-01-08 DIAGNOSIS — K219 Gastro-esophageal reflux disease without esophagitis: Secondary | ICD-10-CM | POA: Diagnosis not present

## 2018-01-08 DIAGNOSIS — R1033 Periumbilical pain: Secondary | ICD-10-CM | POA: Diagnosis not present

## 2018-01-08 DIAGNOSIS — R10819 Abdominal tenderness, unspecified site: Secondary | ICD-10-CM | POA: Diagnosis not present

## 2018-01-08 DIAGNOSIS — R195 Other fecal abnormalities: Secondary | ICD-10-CM | POA: Diagnosis not present

## 2018-01-08 DIAGNOSIS — K5900 Constipation, unspecified: Secondary | ICD-10-CM | POA: Diagnosis not present

## 2018-12-04 ENCOUNTER — Other Ambulatory Visit: Payer: Self-pay

## 2018-12-04 ENCOUNTER — Emergency Department: Payer: No Typology Code available for payment source

## 2018-12-04 ENCOUNTER — Encounter: Payer: Self-pay | Admitting: Emergency Medicine

## 2018-12-04 ENCOUNTER — Emergency Department
Admission: EM | Admit: 2018-12-04 | Discharge: 2018-12-04 | Disposition: A | Discharge status: Home / Self Care | Payer: No Typology Code available for payment source | Attending: Emergency Medicine | Admitting: Emergency Medicine

## 2018-12-04 DIAGNOSIS — Y92410 Unspecified street and highway as the place of occurrence of the external cause: Secondary | ICD-10-CM | POA: Diagnosis not present

## 2018-12-04 DIAGNOSIS — Y9389 Activity, other specified: Secondary | ICD-10-CM | POA: Diagnosis not present

## 2018-12-04 DIAGNOSIS — S199XXA Unspecified injury of neck, initial encounter: Secondary | ICD-10-CM | POA: Diagnosis present

## 2018-12-04 DIAGNOSIS — Z7984 Long term (current) use of oral hypoglycemic drugs: Secondary | ICD-10-CM | POA: Insufficient documentation

## 2018-12-04 DIAGNOSIS — R1032 Left lower quadrant pain: Secondary | ICD-10-CM | POA: Insufficient documentation

## 2018-12-04 DIAGNOSIS — I10 Essential (primary) hypertension: Secondary | ICD-10-CM | POA: Diagnosis not present

## 2018-12-04 DIAGNOSIS — R519 Headache, unspecified: Secondary | ICD-10-CM | POA: Insufficient documentation

## 2018-12-04 DIAGNOSIS — E119 Type 2 diabetes mellitus without complications: Secondary | ICD-10-CM | POA: Diagnosis not present

## 2018-12-04 DIAGNOSIS — S161XXA Strain of muscle, fascia and tendon at neck level, initial encounter: Secondary | ICD-10-CM | POA: Insufficient documentation

## 2018-12-04 DIAGNOSIS — Y998 Other external cause status: Secondary | ICD-10-CM | POA: Insufficient documentation

## 2018-12-04 DIAGNOSIS — Z79899 Other long term (current) drug therapy: Secondary | ICD-10-CM | POA: Insufficient documentation

## 2018-12-04 DIAGNOSIS — S2239XA Fracture of one rib, unspecified side, initial encounter for closed fracture: Secondary | ICD-10-CM

## 2018-12-04 LAB — URINALYSIS, COMPLETE (UACMP) WITH MICROSCOPIC
Bacteria, UA: NONE SEEN
Bilirubin Urine: NEGATIVE
Glucose, UA: >=500 mg/dL — AB
Hgb urine dipstick: NEGATIVE
Ketones, ur: NEGATIVE mg/dL
Leukocytes,Ua: NEGATIVE
Nitrite: NEGATIVE
Protein, ur: NEGATIVE mg/dL
Specific Gravity, Urine: 1.029 (ref 1.005–1.030)
Squamous Epithelial / HPF: NONE SEEN (ref 0–5)
pH: 5 (ref 5.0–8.0)

## 2018-12-04 LAB — BASIC METABOLIC PANEL
Anion gap: 9 (ref 5–15)
BUN: 13 mg/dL (ref 8–23)
CO2: 23 mmol/L (ref 22–32)
Calcium: 8.9 mg/dL (ref 8.9–10.3)
Chloride: 101 mmol/L (ref 98–111)
Creatinine, Ser: 0.8 mg/dL (ref 0.61–1.24)
GFR calc Af Amer: >60 mL/min (ref >60)
GFR calc non Af Amer: >60 mL/min (ref >60)
Glucose, Bld: 579 mg/dL (ref 70–99)
Potassium: 4.3 mmol/L (ref 3.5–5.1)
Sodium: 133 mmol/L — ABNORMAL LOW (ref 135–145)

## 2018-12-04 LAB — CBC
HCT: 37.9 % — ABNORMAL LOW (ref 39.0–52.0)
Hemoglobin: 13.7 g/dL (ref 13.0–17.0)
MCH: 31.5 pg (ref 26.0–34.0)
MCHC: 36.1 g/dL — ABNORMAL HIGH (ref 30.0–36.0)
MCV: 87.1 fL (ref 80.0–100.0)
Platelets: 232 10*3/uL (ref 150–400)
RBC: 4.35 MIL/uL (ref 4.22–5.81)
RDW: 11.3 % — ABNORMAL LOW (ref 11.5–15.5)
WBC: 5.2 10*3/uL (ref 4.0–10.5)
nRBC: 0 % (ref 0.0–0.2)

## 2018-12-04 LAB — GLUCOSE, CAPILLARY: Glucose-Capillary: 300 mg/dL — ABNORMAL HIGH (ref 70–99)

## 2018-12-04 MED ORDER — IOHEXOL 300 MG/ML  SOLN
100.0000 mL | Freq: Once | INTRAMUSCULAR | Status: AC | PRN
  Administered 2018-12-04: 17:00:00 100 mL via INTRAVENOUS

## 2018-12-04 MED ORDER — ONDANSETRON HCL 4 MG/2ML IJ SOLN
4.0000 mg | Freq: Once | INTRAMUSCULAR | Status: AC
  Administered 2018-12-04: 18:00:00 4 mg via INTRAVENOUS
  Filled 2018-12-04: qty 2

## 2018-12-04 MED ORDER — OXYCODONE-ACETAMINOPHEN 5-325 MG PO TABS: 1.0000 | ORAL_TABLET | ORAL | 0 refills | Status: DC | PRN

## 2018-12-04 MED ORDER — SODIUM CHLORIDE 0.9 % IV BOLUS
1000.0000 mL | Freq: Once | INTRAVENOUS | Status: AC
  Administered 2018-12-04: 17:00:00 1000 mL via INTRAVENOUS

## 2018-12-04 MED ORDER — MORPHINE SULFATE (PF) 4 MG/ML IV SOLN
4.0000 mg | Freq: Once | INTRAVENOUS | Status: AC
  Administered 2018-12-04: 18:00:00 4 mg via INTRAVENOUS
  Filled 2018-12-04: qty 1
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
## 2018-12-04 NOTE — Discharge Instructions (Addendum)
Manage your blood sugars carefully.  Use Tylenol as needed for the pain.  If you need something more I will give you some Percocet use 1 pill 4 times a day as needed for pain.  Be careful can make you woozy and constipated.  Do not drive if you are taking it.  Follow-up with your regular doctor in about a week to check and see how you are doing.  Sometimes you need physical therapy for your neck.  He can arrange that.  Return for any worsening pain or any shortness of breath.  Return for any blood in the urine.

## 2018-12-04 NOTE — ED Triage Notes (Signed)
Pt also with FSBS of 529 and c-collar in place.

## 2018-12-04 NOTE — ED Notes (Signed)
Victor Arnold,number [id number]

## 2019-05-01 ENCOUNTER — Encounter: Payer: Self-pay | Admitting: Emergency Medicine

## 2019-05-01 ENCOUNTER — Other Ambulatory Visit: Payer: Self-pay

## 2019-05-01 ENCOUNTER — Inpatient Hospital Stay
Admission: EM | Admit: 2019-05-01 | Discharge: 2019-05-05 | DRG: 177 | Discharge status: Against Medical Advice | Payer: Medicare Other | Attending: Internal Medicine | Admitting: Internal Medicine

## 2019-05-01 ENCOUNTER — Emergency Department: Payer: Medicare Other

## 2019-05-01 DIAGNOSIS — Z79891 Long term (current) use of opiate analgesic: Secondary | ICD-10-CM

## 2019-05-01 DIAGNOSIS — I10 Essential (primary) hypertension: Secondary | ICD-10-CM | POA: Diagnosis present

## 2019-05-01 DIAGNOSIS — R109 Unspecified abdominal pain: Secondary | ICD-10-CM

## 2019-05-01 DIAGNOSIS — Z7984 Long term (current) use of oral hypoglycemic drugs: Secondary | ICD-10-CM

## 2019-05-01 DIAGNOSIS — R52 Pain, unspecified: Secondary | ICD-10-CM

## 2019-05-01 DIAGNOSIS — Z9842 Cataract extraction status, left eye: Secondary | ICD-10-CM | POA: Diagnosis not present

## 2019-05-01 DIAGNOSIS — K219 Gastro-esophageal reflux disease without esophagitis: Secondary | ICD-10-CM | POA: Diagnosis present

## 2019-05-01 DIAGNOSIS — Z961 Presence of intraocular lens: Secondary | ICD-10-CM | POA: Diagnosis present

## 2019-05-01 DIAGNOSIS — Z9841 Cataract extraction status, right eye: Secondary | ICD-10-CM | POA: Diagnosis not present

## 2019-05-01 DIAGNOSIS — Z79899 Other long term (current) drug therapy: Secondary | ICD-10-CM | POA: Diagnosis not present

## 2019-05-01 DIAGNOSIS — E1110 Type 2 diabetes mellitus with ketoacidosis without coma: Secondary | ICD-10-CM | POA: Diagnosis present

## 2019-05-01 DIAGNOSIS — U071 COVID-19: Principal | ICD-10-CM | POA: Diagnosis present

## 2019-05-01 DIAGNOSIS — Z5329 Procedure and treatment not carried out because of patient's decision for other reasons: Secondary | ICD-10-CM | POA: Diagnosis not present

## 2019-05-01 DIAGNOSIS — N320 Bladder-neck obstruction: Secondary | ICD-10-CM | POA: Diagnosis present

## 2019-05-01 DIAGNOSIS — G8929 Other chronic pain: Secondary | ICD-10-CM | POA: Diagnosis present

## 2019-05-01 DIAGNOSIS — R599 Enlarged lymph nodes, unspecified: Secondary | ICD-10-CM | POA: Diagnosis present

## 2019-05-01 DIAGNOSIS — Z20822 Contact with and (suspected) exposure to covid-19: Secondary | ICD-10-CM

## 2019-05-01 DIAGNOSIS — E785 Hyperlipidemia, unspecified: Secondary | ICD-10-CM | POA: Diagnosis present

## 2019-05-01 DIAGNOSIS — J1282 Pneumonia due to coronavirus disease 2019: Secondary | ICD-10-CM | POA: Diagnosis present

## 2019-05-01 DIAGNOSIS — M549 Dorsalgia, unspecified: Secondary | ICD-10-CM | POA: Diagnosis present

## 2019-05-01 LAB — BLOOD GAS, VENOUS
Acid-base deficit: 6.6 mmol/L — ABNORMAL HIGH (ref 0.0–2.0)
Bicarbonate: 20.2 mmol/L (ref 20.0–28.0)
O2 Saturation: 69.4 %
Patient temperature: 37
pCO2, Ven: 44 mmHg (ref 44.0–60.0)
pH, Ven: 7.27 (ref 7.250–7.430)
pO2, Ven: 42 mmHg (ref 32.0–45.0)

## 2019-05-01 LAB — URINALYSIS, COMPLETE (UACMP) WITH MICROSCOPIC
Bacteria, UA: NONE SEEN
Bilirubin Urine: NEGATIVE
Glucose, UA: >=500 mg/dL — AB
Ketones, ur: 80 mg/dL — AB
Nitrite: NEGATIVE
Protein, ur: 100 mg/dL — AB
Specific Gravity, Urine: 1.027 (ref 1.005–1.030)
Squamous Epithelial / HPF: NONE SEEN (ref 0–5)
WBC, UA: >50 WBC/hpf — ABNORMAL HIGH (ref 0–5)
pH: 6 (ref 5.0–8.0)

## 2019-05-01 LAB — CBC
HCT: 40.5 % (ref 39.0–52.0)
Hemoglobin: 14 g/dL (ref 13.0–17.0)
MCH: 31 pg (ref 26.0–34.0)
MCHC: 34.6 g/dL (ref 30.0–36.0)
MCV: 89.6 fL (ref 80.0–100.0)
Platelets: 239 10*3/uL (ref 150–400)
RBC: 4.52 MIL/uL (ref 4.22–5.81)
RDW: 11.9 % (ref 11.5–15.5)
WBC: 10.3 10*3/uL (ref 4.0–10.5)
nRBC: 0 % (ref 0.0–0.2)

## 2019-05-01 LAB — COMPREHENSIVE METABOLIC PANEL
ALT: 14 U/L (ref 0–44)
AST: 24 U/L (ref 15–41)
Albumin: 3.2 g/dL — ABNORMAL LOW (ref 3.5–5.0)
Alkaline Phosphatase: 73 U/L (ref 38–126)
Anion gap: 19 — ABNORMAL HIGH (ref 5–15)
BUN: 25 mg/dL — ABNORMAL HIGH (ref 8–23)
CO2: 20 mmol/L — ABNORMAL LOW (ref 22–32)
Calcium: 8.6 mg/dL — ABNORMAL LOW (ref 8.9–10.3)
Chloride: 92 mmol/L — ABNORMAL LOW (ref 98–111)
Creatinine, Ser: 1.1 mg/dL (ref 0.61–1.24)
GFR calc Af Amer: >60 mL/min (ref >60)
GFR calc non Af Amer: >60 mL/min (ref >60)
Glucose, Bld: 350 mg/dL — ABNORMAL HIGH (ref 70–99)
Potassium: 4.4 mmol/L (ref 3.5–5.1)
Sodium: 131 mmol/L — ABNORMAL LOW (ref 135–145)
Total Bilirubin: 1.8 mg/dL — ABNORMAL HIGH (ref 0.3–1.2)
Total Protein: 7.9 g/dL (ref 6.5–8.1)

## 2019-05-01 LAB — BASIC METABOLIC PANEL
Anion gap: 20 — ABNORMAL HIGH (ref 5–15)
Anion gap: 12 (ref 5–15)
BUN: 22 mg/dL (ref 8–23)
BUN: 18 mg/dL (ref 8–23)
CO2: 17 mmol/L — ABNORMAL LOW (ref 22–32)
CO2: 24 mmol/L (ref 22–32)
Calcium: 8.3 mg/dL — ABNORMAL LOW (ref 8.9–10.3)
Calcium: 8.5 mg/dL — ABNORMAL LOW (ref 8.9–10.3)
Chloride: 95 mmol/L — ABNORMAL LOW (ref 98–111)
Chloride: 99 mmol/L (ref 98–111)
Creatinine, Ser: 0.96 mg/dL (ref 0.61–1.24)
Creatinine, Ser: 0.87 mg/dL (ref 0.61–1.24)
GFR calc Af Amer: >60 mL/min (ref >60)
GFR calc Af Amer: >60 mL/min (ref >60)
GFR calc non Af Amer: >60 mL/min (ref >60)
GFR calc non Af Amer: >60 mL/min (ref >60)
Glucose, Bld: 361 mg/dL — ABNORMAL HIGH (ref 70–99)
Glucose, Bld: 196 mg/dL — ABNORMAL HIGH (ref 70–99)
Potassium: 3.8 mmol/L (ref 3.5–5.1)
Potassium: 4.3 mmol/L (ref 3.5–5.1)
Sodium: 132 mmol/L — ABNORMAL LOW (ref 135–145)
Sodium: 135 mmol/L (ref 135–145)

## 2019-05-01 LAB — GLUCOSE, CAPILLARY
Glucose-Capillary: 343 mg/dL — ABNORMAL HIGH (ref 70–99)
Glucose-Capillary: 294 mg/dL — ABNORMAL HIGH (ref 70–99)
Glucose-Capillary: 246 mg/dL — ABNORMAL HIGH (ref 70–99)
Glucose-Capillary: 181 mg/dL — ABNORMAL HIGH (ref 70–99)
Glucose-Capillary: 157 mg/dL — ABNORMAL HIGH (ref 70–99)

## 2019-05-01 LAB — BETA-HYDROXYBUTYRIC ACID: Beta-Hydroxybutyric Acid: 6.72 mmol/L — ABNORMAL HIGH (ref 0.05–0.27)

## 2019-05-01 LAB — RESPIRATORY PANEL BY RT PCR (FLU A&B, COVID)
Influenza A by PCR: NEGATIVE
Influenza B by PCR: NEGATIVE
SARS Coronavirus 2 by RT PCR: POSITIVE — AB

## 2019-05-01 LAB — LIPASE, BLOOD: Lipase: 14 U/L (ref 11–51)

## 2019-05-01 MED ORDER — POTASSIUM CHLORIDE 10 MEQ/100ML IV SOLN
10.0000 meq | INTRAVENOUS | Status: AC
  Administered 2019-05-01 (×2): 10 meq via INTRAVENOUS
  Filled 2019-05-01 (×2): qty 100

## 2019-05-01 MED ORDER — ACETAMINOPHEN 325 MG PO TABS: 650.0000 mg | ORAL_TABLET | Freq: Four times a day (QID) | ORAL | Status: DC | PRN

## 2019-05-01 MED ORDER — LACTATED RINGERS IV BOLUS
1000.0000 mL | Freq: Once | INTRAVENOUS | Status: AC
  Administered 2019-05-01: 17:00:00 1000 mL via INTRAVENOUS

## 2019-05-01 MED ORDER — POTASSIUM CHLORIDE 10 MEQ/100ML IV SOLN
10.0000 meq | INTRAVENOUS | Status: AC
  Administered 2019-05-01: 22:00:00 10 meq via INTRAVENOUS
  Filled 2019-05-01: qty 100

## 2019-05-01 MED ORDER — SODIUM CHLORIDE 0.9 % IV SOLN: INTRAVENOUS | Status: DC

## 2019-05-01 MED ORDER — DEXTROSE 50 % IV SOLN: 0.0000 mL | INTRAVENOUS | Status: DC | PRN

## 2019-05-01 MED ORDER — HYDROMORPHONE HCL 1 MG/ML IJ SOLN
0.5000 mg | Freq: Once | INTRAMUSCULAR | Status: AC
  Administered 2019-05-01: 0.5 mg via INTRAVENOUS
  Filled 2019-05-01: qty 1

## 2019-05-01 MED ORDER — ENOXAPARIN SODIUM 40 MG/0.4ML ~~LOC~~ SOLN
40.0000 mg | SUBCUTANEOUS | Status: DC
  Administered 2019-05-02 – 2019-05-04 (×3): 40 mg via SUBCUTANEOUS
  Filled 2019-05-01 (×3): qty 0.4

## 2019-05-01 MED ORDER — INSULIN REGULAR(HUMAN) IN NACL 100-0.9 UT/100ML-% IV SOLN
INTRAVENOUS | Status: DC
  Administered 2019-05-01: 14 [IU]/h via INTRAVENOUS
  Filled 2019-05-01: qty 100

## 2019-05-01 MED ORDER — DEXAMETHASONE SODIUM PHOSPHATE 10 MG/ML IJ SOLN
6.0000 mg | INTRAMUSCULAR | Status: DC
  Administered 2019-05-02: 21:00:00 6 mg via INTRAVENOUS
  Filled 2019-05-01: qty 1

## 2019-05-01 MED ORDER — MORPHINE SULFATE (PF) 2 MG/ML IV SOLN
0.5000 mg | INTRAVENOUS | Status: DC | PRN
  Administered 2019-05-01 – 2019-05-02 (×2): 0.5 mg via INTRAVENOUS
  Filled 2019-05-01 (×2): qty 1

## 2019-05-01 MED ORDER — MORPHINE SULFATE (PF) 4 MG/ML IV SOLN
4.0000 mg | Freq: Once | INTRAVENOUS | Status: AC
  Administered 2019-05-01: 4 mg via INTRAVENOUS
  Filled 2019-05-01: qty 1

## 2019-05-01 MED ORDER — DEXTROSE-NACL 5-0.45 % IV SOLN: INTRAVENOUS | Status: DC

## 2019-05-01 MED ORDER — SODIUM CHLORIDE 0.9 % IV BOLUS
1000.0000 mL | Freq: Once | INTRAVENOUS | Status: AC
  Administered 2019-05-01: 1000 mL via INTRAVENOUS

## 2019-05-01 MED ORDER — SODIUM CHLORIDE 0.9% FLUSH: 3.0000 mL | Freq: Once | INTRAVENOUS | Status: DC

## 2019-05-01 MED ORDER — ONDANSETRON HCL 4 MG/2ML IJ SOLN
4.0000 mg | Freq: Once | INTRAMUSCULAR | Status: AC
  Administered 2019-05-01: 4 mg via INTRAVENOUS
  Filled 2019-05-01: qty 2

## 2019-05-01 MED ORDER — ONDANSETRON HCL 4 MG/2ML IJ SOLN
4.0000 mg | Freq: Four times a day (QID) | INTRAMUSCULAR | Status: DC | PRN
  Administered 2019-05-01 – 2019-05-02 (×3): 4 mg via INTRAVENOUS
  Filled 2019-05-01 (×3): qty 2

## 2019-05-01 MED ORDER — IOHEXOL 300 MG/ML  SOLN
100.0000 mL | Freq: Once | INTRAMUSCULAR | Status: AC | PRN
  Administered 2019-05-01: 17:00:00 100 mL via INTRAVENOUS

## 2019-05-01 NOTE — H&P (Signed)
History and Physical    Victor Arnold. [id number] DOB: 1955-07-28 DOA: 05/01/2019  PCP: Cletis Athens, MD  Patient coming from: Home  I have personally briefly reviewed patient's old medical records in [place]  Chief Complaint: Abdominal pain, nausea vomiting  HPI: Victor Arnold. is a 64 y.o. male with medical history significant for type 2 diabetes, hyperlipidemia and GERD who presents with concerns of abdominal pain. Patient reports that symptoms started about 2 days ago when he felt left upper abdominal pain that felt like a "knot" in his stomach.  Also began to have nausea, vomiting as well as some diarrhea.  He denies any fever.  No history of abdominal surgery.  Reports that he still has been able to take his medication including Metformin.  Denies any headache.  No chest pain or shortness of breath.  Denies tobacco, alcohol illicit drug use.  ED Course: Patient was tachycardic and hypotensive on arrival down to 80 over 50s but improved following fluids. No leukocytosis, blood glucose of 361, anion gap of 20, pH of 7.27 with bicarbonate of 20.  Potassium of 3.8.  UA showed moderate leukocyte, negative nitrite and no bacteria.  CT abdomen pelvis showed extensive patchy groundglass opacity of both lung base suspicious for viral pneumonia.  His Covid test later returned positive.  Review of Systems: Constitutional: No Weight Change, No Fever ENT/Mouth: No sore throat, No Rhinorrhea Eyes: No Eye Pain, No Vision Changes Cardiovascular: No Chest Pain, no SOB Respiratory: + Cough, No Sputum Gastrointestinal: + Nausea,+ Vomiting, + Diarrhea, No Constipation, + Pain Genitourinary: no Urinary Incontinence, No Urgency, No Flank Pain Musculoskeletal: No Arthralgias, No Myalgias Skin: No Skin Lesions, No Pruritus, Neuro: no Weakness, No Numbness Psych: No Anxiety/Panic, No Depression, + decrease appetite Heme/Lymph: No Bruising, No Bleeding  Past Medical History:    Diagnosis Date   Diabetes mellitus without complication (HCC)    GERD (gastroesophageal reflux disease)    Hypertension     Past Surgical History:  Procedure Laterality Date   CATARACT EXTRACTION W/PHACO Right 11/04/2017   Procedure: CATARACT EXTRACTION PHACO AND INTRAOCULAR LENS PLACEMENT (IOC);  Surgeon: Birder Robson, MD;  Location: [facility] ORS;  Service: Ophthalmology;  Laterality: Right;  [place]  01:46 CDE 9.00 Fluid pack lot # [id number]   CATARACT EXTRACTION W/PHACO Left 12/02/2017   Procedure: CATARACT EXTRACTION PHACO AND INTRAOCULAR LENS PLACEMENT (IOC);  Surgeon: Birder Robson, MD;  Location: [facility] ORS;  Service: Ophthalmology;  Laterality: Left;  [place] 01:26.9 CDE 9.19 Fluid pack lot # [id number]   COLONOSCOPY WITH PROPOFOL N/A 08/26/2017   Procedure: COLONOSCOPY WITH PROPOFOL;  Surgeon: Lin Landsman, MD;  Location: [facility] ENDOSCOPY;  Service: Gastroenterology;  Laterality: N/A;   NO PAST SURGERIES       reports that he has never smoked. He has never used smokeless tobacco. He reports previous alcohol use. He reports previous drug use.  No Known Allergies  History reviewed. No pertinent family history.   Prior to Admission medications   Medication Sig Start Date End Date Taking? Authorizing Provider  glipiZIDE (GLUCOTROL XL) 5 MG 24 hr tablet Take 5 mg by mouth daily. 12/29/17  Yes [provider]  lisinopril (ZESTRIL) 10 MG tablet Take 10 mg by mouth daily. 12/29/17  Yes [provider]  aluminum hydroxide-magnesium carbonate (GAVISCON) 95-358 MG/15ML SUSP Take 15 mLs by mouth as needed for indigestion.    [provider]  atorvastatin (LIPITOR) 20 MG tablet Take 20 mg by mouth daily  at 12 noon. 10/06/17   [provider]  metFORMIN (GLUCOPHAGE-XR) 500 MG 24 hr tablet Take 500 mg by mouth 2 (two) times daily. 09/03/17   [provider]  omeprazole (PRILOSEC) 20 MG capsule Take 20 mg by mouth daily. 10/27/17   [provider]  oxyCODONE-acetaminophen (PERCOCET) 5-325 MG tablet Take 1 tablet by mouth every 4 (four) hours as needed for severe pain. 12/04/18 12/04/19  [name], MD  pregabalin (LYRICA) 50 MG capsule Take 50 mg by mouth every 6 (six) hours. 11/20/17   [provider]  traZODone (DESYREL) 50 MG tablet Take 50 mg by mouth at bedtime.  10/20/17   [provider]    Physical Exam: Vitals:   05/01/19 1649 05/01/19 1650 05/01/19 1733 05/01/19 1734  BP:   (!) 143/86   Pulse: (!) 103 (!) 102  100  Resp:      Temp:      SpO2: 97% 96%  96%  Weight:      Height:        Constitutional: NAD, calm, comfortable, mildly ill-appearing elderly gentleman laying asleep in bed Vitals:   05/01/19 1649 05/01/19 1650 05/01/19 1733 05/01/19 1734  BP:   (!) 143/86   Pulse: (!) 103 (!) 102  100  Resp:      Temp:      SpO2: 97% 96%  96%  Weight:      Height:       Eyes: PERRL, lids and conjunctivae normal ENMT: Mucous membranes are moist.  Neck: normal, supple Respiratory: clear to auscultation bilaterally, no wheezing, no crackles. Normal respiratory effort. No accessory muscle use.  Cardiovascular: Regular rate and rhythm, no murmurs / rubs / gallops. No extremity edema. 2+ pedal pulses.  Abdomen: no tenderness, no masses palpated.  No rigidity, guarding or rebound tenderness.  Bowel sounds positive.  Musculoskeletal: no clubbing / cyanosis. No joint deformity upper and lower extremities. Good ROM, no contractures. Normal muscle tone.  Skin: no rashes, lesions, ulcers. No induration Neurologic: CN 2-12 grossly intact. Sensation intact. Strength 5/5 in all 4.  Psychiatric: Normal judgment and insight. Alert and oriented x 3. Normal mood.     Labs on Admission: I have personally reviewed following labs and imaging studies  CBC: Recent Labs  Lab 05/01/19 1317  WBC 10.3  HGB 14.0  HCT 40.5  MCV 89.6  PLT [id number]   Basic Metabolic Panel: Recent Labs  Lab  05/01/19 1317 05/01/19 1826  NA 131* 132*  K 4.4 3.8  CL 92* 95*  CO2 20* 17*  GLUCOSE 350* 361*  BUN 25* 22  CREATININE 1.10 0.96  CALCIUM 8.6* 8.3*   GFR: Estimated Creatinine Clearance: 76.7 mL/min (by C-G formula based on SCr of 0.96 mg/dL). Liver Function Tests: Recent Labs  Lab 05/01/19 1317  AST 24  ALT 14  ALKPHOS 73  BILITOT 1.8*  PROT 7.9  ALBUMIN 3.2*   Recent Labs  Lab 05/01/19 1317  LIPASE 14   No results for input(s): AMMONIA in the last 168 hours. Coagulation Profile: No results for input(s): INR, PROTIME in the last 168 hours. Cardiac Enzymes: No results for input(s): CKTOTAL, CKMB, CKMBINDEX, TROPONINI in the last 168 hours. BNP (last 3 results) No results for input(s): PROBNP in the last 8760 hours. HbA1C: No results for input(s): HGBA1C in the last 72 hours. CBG: Recent Labs  Lab 05/01/19 1309 05/01/19 1806 05/01/19 1927  GLUCAP 343* 294* 246*   Lipid Profile: No results  for input(s): CHOL, HDL, LDLCALC, TRIG, CHOLHDL, LDLDIRECT in the last 72 hours. Thyroid Function Tests: No results for input(s): TSH, T4TOTAL, FREET4, T3FREE, THYROIDAB in the last 72 hours. Anemia Panel: No results for input(s): VITAMINB12, FOLATE, FERRITIN, TIBC, IRON, RETICCTPCT in the last 72 hours. Urine analysis:    Component Value Date/Time   COLORURINE YELLOW (A) 05/01/2019 1642   APPEARANCEUR CLOUDY (A) 05/01/2019 1642   LABSPEC 1.027 05/01/2019 1642   PHURINE 6.0 05/01/2019 1642   GLUCOSEU >=500 (A) 05/01/2019 1642   HGBUR LARGE (A) 05/01/2019 1642   BILIRUBINUR NEGATIVE 05/01/2019 1642   KETONESUR 80 (A) 05/01/2019 1642   PROTEINUR 100 (A) 05/01/2019 1642   NITRITE NEGATIVE 05/01/2019 1642   LEUKOCYTESUR MODERATE (A) 05/01/2019 1642    Radiological Exams on Admission: CT Abdomen Pelvis W Contrast  Result Date: 05/01/2019 CLINICAL DATA:  Abdominal pain with nausea and vomiting for 2 days. History of diabetes. EXAM: CT ABDOMEN AND PELVIS WITH  CONTRAST TECHNIQUE: Multidetector CT imaging of the abdomen and pelvis was performed using the standard protocol following bolus administration of intravenous contrast. CONTRAST:  174mL OMNIPAQUE IOHEXOL 300 MG/ML  SOLN COMPARISON:  Chest radiographs and abdominopelvic CT 12/04/2018. FINDINGS: Lower chest: There are extensive patchy ground-glass opacities at both lung bases which are new and suspicious for viral pneumonia. No consolidation, pleural or pericardial effusion. Hepatobiliary: The liver is normal in density without suspicious focal abnormality. No evidence of gallstones, gallbladder wall thickening or biliary dilatation. Pancreas: Mildly atrophied. There is mildly increased dilatation of the main pancreatic duct, but no evidence of pancreatic mass or surrounding inflammatory change. Spleen: Normal in size without focal abnormality. Adrenals/Urinary Tract: Both adrenal glands appear normal. Stable bilateral renal cysts. There are probable small nonobstructing bilateral renal calculi. The urinary bladder is markedly distended, and there is mild bilateral pelvicaliectasis. No evidence of ureteral calculus or focal perinephric fluid collection. Stomach/Bowel: No evidence of bowel wall thickening, distention or surrounding inflammatory change. The appendix is not clearly visualized. Vascular/Lymphatic: Multiple enlarged retroperitoneal lymph nodes are again noted, similar to the previous study. Representative nodes include a 2.1 cm short axis left periaortic node on image 37/2 and a 1.8 cm right periaortic node on image 39/2. Probable enlarged mesenteric lymph nodes, suboptimally evaluated due to the lack of enteric contrast and paucity of intra-abdominal fat. Mild aortic and branch vessel atherosclerosis. The portal, superior mesenteric and splenic veins are patent. Reproductive: Mild enlargement of the prostate gland with central calcifications. Other: Paucity of intra-abdominal fat. No ascites, focal  extraluminal fluid collection or free air. Musculoskeletal: No acute or significant osseous findings. Lower lumbar spondylosis and asymmetric right hip arthropathy. IMPRESSION: 1. Extensive patchy ground-glass opacities at both lung bases, new and suspicious for viral pneumonia. Consider chest radiographs and COVID-19 testing. 2. No significant change in multiple enlarged retroperitoneal and mesenteric lymph nodes. Based on the size of these nodes, a lymphoproliferative process remains a distinct possibility, although they could be reactive given their stability over 5 months. 3. Markedly distended urinary bladder with mild bilateral pelvicaliectasis suggesting chronic bladder outlet obstruction. No evidence of ureteral calculus or focal perinephric fluid collection. 4. Aortic Atherosclerosis (ICD10-I70.0). Electronically Signed   By: Richardean Sale M.D.   On: 05/01/2019 17:45    EKG: Independently reviewed.   Assessment/Plan  Hyperglycemia/early DKA replete potassium continue insulin gtt with goal of 140-180 and AG <12 IV NS until BG <250, then switch to D5 1/2 NS  BMP q4hr  keep NPO  COVID infection Not  hypoxic.  Continue pulse oximetry monitoring Start Decadron Follow inflammatory labs  Chronic bladder outlet obstruction Unclear etiology.  No acute symptoms at this time. Need follow-up outpatient.  Hypertension Resume antihypertensive when able to tolerate PO  Hyperlipidemia Resume statin when able to tolerate PO    DVT prophylaxis:.Lovenox Code Status: Full Family Communication: Plan discussed with patient at bedside  disposition Plan: Home with at least 2 midnight stays  Consults called:  Admission status: inpatient requiring stepdown monitoring and insulin infusion   Imaad Reuss T Tashawn Greff DO [facility]   If 7PM-7AM, please contact night-coverage [email or web address]   05/01/2019, 7:42 PM

## 2019-05-01 NOTE — ED Provider Notes (Signed)
[facility] Emergency Department Provider Note   ____________________________________________   First MD Initiated Contact with Patient 05/01/19 1633     (approximate)  I have reviewed the triage vital signs and the nursing notes.   HISTORY  Chief Complaint Abdominal Pain    HPI Victor Kleintop. is a 64 y.o. male with past [place] history of hypertension, diabetes, and GERD who presents to the ED complaining of abdominal pain.  Patient reports that he has had 48 hours of consistent epigastric pain as well as nausea and vomiting.  He has had occasional diarrhea with this, but has not noticed any blood in his stool.  He denies any fevers, chills, cough, chest pain, or shortness of breath.  He does admit that a couple of weeks ago he was not taking his Metformin on a regular basis, but now has been taking more consistently over the past 1 to 2 weeks.  He denies any history of DKA, also denies any abdominal surgical history.        Past Medical History:  Diagnosis Date  . Diabetes mellitus without complication ([place])   . GERD (gastroesophageal reflux disease)   . Hypertension     Patient Active Problem List   Diagnosis Date Noted  . DKA (diabetic ketoacidoses) ([place]) 05/01/2019  . COVID-19 virus infection 05/01/2019  . Bladder outlet obstruction 05/01/2019  . GERD (gastroesophageal reflux disease) 05/01/2019  . Essential hypertension 05/01/2019  . HLD (hyperlipidemia) 05/01/2019  . Encounter for screening colonoscopy     Past Surgical History:  Procedure Laterality Date  . CATARACT EXTRACTION W/PHACO Right 11/04/2017   Procedure: CATARACT EXTRACTION PHACO AND INTRAOCULAR LENS PLACEMENT ([place]);  Surgeon: Birder Robson, MD;  Location: [facility] ORS;  Service: Ophthalmology;  Laterality: Right;  [place]  01:46 CDE 9.00 Fluid pack lot # [id number]  . CATARACT EXTRACTION W/PHACO Left 12/02/2017   Procedure: CATARACT EXTRACTION PHACO AND INTRAOCULAR LENS  PLACEMENT ([facility]);  Surgeon: Birder Robson, MD;  Location: [facility] ORS;  Service: Ophthalmology;  Laterality: Left;  [place] 01:26.9 CDE 9.19 Fluid pack lot # [id number]  . COLONOSCOPY WITH PROPOFOL N/A 08/26/2017   Procedure: COLONOSCOPY WITH PROPOFOL;  Surgeon: Lin Landsman, MD;  Location: [facility] ENDOSCOPY;  Service: Gastroenterology;  Laterality: N/A;  . NO PAST SURGERIES      Prior to Admission medications   Medication Sig Start Date End Date Taking? Authorizing Provider  glipiZIDE (GLUCOTROL XL) 5 MG 24 hr tablet Take 5 mg by mouth daily. 12/29/17  Yes [provider]  lisinopril (ZESTRIL) 10 MG tablet Take 10 mg by mouth daily. 12/29/17  Yes [provider]  aluminum hydroxide-magnesium carbonate (GAVISCON) 95-358 MG/15ML SUSP Take 15 mLs by mouth as needed for indigestion.    [provider]  atorvastatin (LIPITOR) 20 MG tablet Take 20 mg by mouth daily at 12 noon. 10/06/17   [provider]  metFORMIN (GLUCOPHAGE-XR) 500 MG 24 hr tablet Take 500 mg by mouth 2 (two) times daily. 09/03/17   [provider]  omeprazole (PRILOSEC) 20 MG capsule Take 20 mg by mouth daily. 10/27/17   [provider]  oxyCODONE-acetaminophen (PERCOCET) 5-325 MG tablet Take 1 tablet by mouth every 4 (four) hours as needed for severe pain. 12/04/18 12/04/19  [name], MD  pregabalin (LYRICA) 50 MG capsule Take 50 mg by mouth every 6 (six) hours. 11/20/17   [provider]  traZODone (DESYREL) 50 MG tablet Take 50 mg by mouth at bedtime.  10/20/17  [provider]    Allergies Patient has no known allergies.  History reviewed. No pertinent family history.  Social History Social History   Tobacco Use  . Smoking status: Never Smoker  . Smokeless tobacco: Never Used  Substance Use Topics  . Alcohol use: Not Currently  . Drug use: Not Currently    Review of Systems  Constitutional: No fever/chills Eyes: No visual changes. ENT: No  sore throat. Cardiovascular: Denies chest pain. Respiratory: Denies shortness of breath. Gastrointestinal: Positive for abdominal pain, nausea, and vomiting.  Positive for diarrhea.  No constipation. Genitourinary: Negative for dysuria. Musculoskeletal: Negative for back pain. Skin: Negative for rash. Neurological: Negative for headaches, focal weakness or numbness.  ____________________________________________   PHYSICAL EXAM:  VITAL SIGNS: ED Triage Vitals  Enc Vitals Group     BP 05/01/19 1302 (!) 82/53     Pulse Rate 05/01/19 1302 98     Resp 05/01/19 1302 18     Temp 05/01/19 1302 98.7 F (37.1 C)     Temp src --      SpO2 05/01/19 1302 97 %     Weight 05/01/19 1305 165 lb (74.8 kg)     Height 05/01/19 1305 5\' 9"  (1.753 m)     Head Circumference --      Peak Flow --      Pain Score 05/01/19 1305 7     Pain Loc --      Pain Edu? --      Excl. in [place]? --     Constitutional: Alert and oriented. Eyes: Conjunctivae are normal. Head: Atraumatic. Nose: No congestion/rhinnorhea. Mouth/Throat: Mucous membranes are moist. Neck: Normal ROM Cardiovascular: Normal rate, regular rhythm. Grossly normal heart sounds. Respiratory: Normal respiratory effort.  No retractions. Lungs CTAB. Gastrointestinal: Soft and diffusely tender to palpation with no rebound or guarding. No distention. Genitourinary: deferred Musculoskeletal: No lower extremity tenderness nor edema. Neurologic:  Normal speech and language. No gross focal neurologic deficits are appreciated. Skin:  Skin is warm, dry and intact. No rash noted. Psychiatric: Mood and affect are normal. Speech and behavior are normal.  ____________________________________________   LABS (all labs ordered are listed, but only abnormal results are displayed)  Labs Reviewed  RESPIRATORY PANEL BY RT PCR (FLU A&B, COVID) - Abnormal; Notable for the following components:      Result Value   SARS Coronavirus 2 by RT PCR POSITIVE (*)      All other components within normal limits  COMPREHENSIVE METABOLIC PANEL - Abnormal; Notable for the following components:   Sodium 131 (*)    Chloride 92 (*)    CO2 20 (*)    Glucose, Bld 350 (*)    BUN 25 (*)    Calcium 8.6 (*)    Albumin 3.2 (*)    Total Bilirubin 1.8 (*)    Anion gap 19 (*)    All other components within normal limits  URINALYSIS, COMPLETE (UACMP) WITH MICROSCOPIC - Abnormal; Notable for the following components:   Color, Urine YELLOW (*)    APPearance CLOUDY (*)    Glucose, UA >=500 (*)    Hgb urine dipstick LARGE (*)    Ketones, ur 80 (*)    Protein, ur 100 (*)    Leukocytes,Ua MODERATE (*)    WBC, UA >50 (*)    All other components within normal limits  GLUCOSE, CAPILLARY - Abnormal; Notable for the following components:   Glucose-Capillary 343 (*)    All other components within normal  limits  BETA-HYDROXYBUTYRIC ACID - Abnormal; Notable for the following components:   Beta-Hydroxybutyric Acid 6.72 (*)    All other components within normal limits  BASIC METABOLIC PANEL - Abnormal; Notable for the following components:   Sodium 132 (*)    Chloride 95 (*)    CO2 17 (*)    Glucose, Bld 361 (*)    Calcium 8.3 (*)    Anion gap 20 (*)    All other components within normal limits  BLOOD GAS, VENOUS - Abnormal; Notable for the following components:   Acid-base deficit 6.6 (*)    All other components within normal limits  GLUCOSE, CAPILLARY - Abnormal; Notable for the following components:   Glucose-Capillary 294 (*)    All other components within normal limits  GLUCOSE, CAPILLARY - Abnormal; Notable for the following components:   Glucose-Capillary 246 (*)    All other components within normal limits  LIPASE, BLOOD  CBC  BASIC METABOLIC PANEL  BASIC METABOLIC PANEL  BASIC METABOLIC PANEL  HIV ANTIBODY (ROUTINE TESTING W REFLEX)  HEMOGLOBIN A1C  C-REACTIVE PROTEIN  CBG MONITORING, ED   ____________________________________________  EKG  ED  ECG REPORT I, Blake Divine, the attending physician, personally viewed and interpreted this ECG.   Date: 05/01/2019  EKG Time: 13:03  Rate: 100  Rhythm: sinus tachycardia  Axis: Normal  Intervals:none  ST&T Change: None   PROCEDURES  Procedure(s) performed (including Critical Care):  .Critical Care Performed by: Blake Divine, MD Authorized by: Blake Divine, MD   Critical care provider statement:    Critical care time (minutes):  45   Critical care was necessary to treat or prevent imminent or life-threatening deterioration of the following conditions:  Endocrine crisis   Critical care was time spent personally by me on the following activities:  Discussions with consultants, evaluation of patient's response to treatment, examination of patient, ordering and performing treatments and interventions, ordering and review of laboratory studies, ordering and review of radiographic studies, pulse oximetry, re-evaluation of patient's condition, obtaining history from patient or surrogate and review of old charts   I assumed direction of critical care for this patient from another provider in my specialty: no       ____________________________________________   INITIAL IMPRESSION / ASSESSMENT AND PLAN / ED COURSE       64 year old male with history of hypertension and diabetes presents to the ED complaining of diffuse abdominal pain with nausea and vomiting for the past 2 days.  He has diffuse tenderness on exam and we will further assess with CT scan.  Initial labs show mild acidosis with hyperglycemia and increased anion gap, suspicious for DKA and we will check beta hydroxybutyrate.  LFTs and lipase are unremarkable, UA pending but I have low suspicion for UTI.  We will continue to hydrate with IV fluids, blood pressure is now improved from initial hypotension.  I suspect this is due to dehydration given his elevated BUN to creatinine ratio.  Beta hydroxybutyrate is elevated  and this confirms DKA.  Blood pressure remains improved, we will start patient on insulin drip.  CT abdomen shows bilateral pulmonary infiltrates concerning for viral pneumonia and we will perform testing for COVID-19.  This would be a potential trigger for his DKA as well as his abdominal pain, vomiting, and diarrhea.  CT is otherwise negative for acute intra-abdominal process.  Case discussed with hospitalist for admission.      ____________________________________________   FINAL CLINICAL IMPRESSION(S) / ED DIAGNOSES  Final diagnoses:  Diabetic ketoacidosis without coma associated with type 2 diabetes mellitus ([place])  Suspected COVID-19 virus infection     ED Discharge Orders    None       Note:  This document was prepared using Dragon voice recognition software and may include unintentional dictation errors.   Blake Divine, MD 05/01/19 2055

## 2019-05-01 NOTE — ED Triage Notes (Signed)
Pt to ER with c/o abdominal pain with n/v for last 2 days.

## 2019-05-02 ENCOUNTER — Encounter: Payer: Self-pay | Admitting: Family Medicine

## 2019-05-02 ENCOUNTER — Inpatient Hospital Stay: Payer: Medicare Other

## 2019-05-02 ENCOUNTER — Other Ambulatory Visit: Payer: Self-pay

## 2019-05-02 DIAGNOSIS — R1084 Generalized abdominal pain: Secondary | ICD-10-CM

## 2019-05-02 LAB — GLUCOSE, CAPILLARY
Glucose-Capillary: 116 mg/dL — ABNORMAL HIGH (ref 70–99)
Glucose-Capillary: 126 mg/dL — ABNORMAL HIGH (ref 70–99)
Glucose-Capillary: 153 mg/dL — ABNORMAL HIGH (ref 70–99)
Glucose-Capillary: 167 mg/dL — ABNORMAL HIGH (ref 70–99)
Glucose-Capillary: 186 mg/dL — ABNORMAL HIGH (ref 70–99)
Glucose-Capillary: 205 mg/dL — ABNORMAL HIGH (ref 70–99)
Glucose-Capillary: 208 mg/dL — ABNORMAL HIGH (ref 70–99)
Glucose-Capillary: 276 mg/dL — ABNORMAL HIGH (ref 70–99)

## 2019-05-02 LAB — C-REACTIVE PROTEIN: CRP: 23 mg/dL — ABNORMAL HIGH (ref <1.0)

## 2019-05-02 LAB — BASIC METABOLIC PANEL
Anion gap: 9 (ref 5–15)
Anion gap: 8 (ref 5–15)
Anion gap: 9 (ref 5–15)
BUN: 16 mg/dL (ref 8–23)
BUN: 14 mg/dL (ref 8–23)
BUN: 13 mg/dL (ref 8–23)
CO2: 25 mmol/L (ref 22–32)
CO2: 25 mmol/L (ref 22–32)
CO2: 22 mmol/L (ref 22–32)
Calcium: 8.4 mg/dL — ABNORMAL LOW (ref 8.9–10.3)
Calcium: 8.2 mg/dL — ABNORMAL LOW (ref 8.9–10.3)
Calcium: 8.3 mg/dL — ABNORMAL LOW (ref 8.9–10.3)
Chloride: 101 mmol/L (ref 98–111)
Chloride: 100 mmol/L (ref 98–111)
Chloride: 100 mmol/L (ref 98–111)
Creatinine, Ser: 0.76 mg/dL (ref 0.61–1.24)
Creatinine, Ser: 0.59 mg/dL — ABNORMAL LOW (ref 0.61–1.24)
Creatinine, Ser: 0.63 mg/dL (ref 0.61–1.24)
GFR calc Af Amer: >60 mL/min (ref >60)
GFR calc Af Amer: >60 mL/min (ref >60)
GFR calc Af Amer: >60 mL/min (ref >60)
GFR calc non Af Amer: >60 mL/min (ref >60)
GFR calc non Af Amer: >60 mL/min (ref >60)
GFR calc non Af Amer: >60 mL/min (ref >60)
Glucose, Bld: 147 mg/dL — ABNORMAL HIGH (ref 70–99)
Glucose, Bld: 210 mg/dL — ABNORMAL HIGH (ref 70–99)
Glucose, Bld: 199 mg/dL — ABNORMAL HIGH (ref 70–99)
Potassium: 3.7 mmol/L (ref 3.5–5.1)
Potassium: 3.4 mmol/L — ABNORMAL LOW (ref 3.5–5.1)
Potassium: 3.5 mmol/L (ref 3.5–5.1)
Sodium: 135 mmol/L (ref 135–145)
Sodium: 133 mmol/L — ABNORMAL LOW (ref 135–145)
Sodium: 131 mmol/L — ABNORMAL LOW (ref 135–145)

## 2019-05-02 LAB — MAGNESIUM: Magnesium: 1.8 mg/dL (ref 1.7–2.4)

## 2019-05-02 LAB — LIPASE, BLOOD: Lipase: 14 U/L (ref 11–51)

## 2019-05-02 LAB — HIV ANTIBODY (ROUTINE TESTING W REFLEX): HIV Screen 4th Generation wRfx: NONREACTIVE

## 2019-05-02 LAB — AMYLASE: Amylase: 21 U/L — ABNORMAL LOW (ref 28–100)

## 2019-05-02 MED ORDER — MORPHINE SULFATE (PF) 2 MG/ML IV SOLN
2.0000 mg | INTRAVENOUS | Status: DC | PRN
  Administered 2019-05-02 – 2019-05-03 (×6): 2 mg via INTRAVENOUS
  Filled 2019-05-02 (×7): qty 1

## 2019-05-02 MED ORDER — POTASSIUM CHLORIDE CRYS ER 20 MEQ PO TBCR
40.0000 meq | EXTENDED_RELEASE_TABLET | Freq: Once | ORAL | Status: AC
  Administered 2019-05-02: 40 meq via ORAL
  Filled 2019-05-02: qty 2

## 2019-05-02 MED ORDER — INSULIN GLARGINE 100 UNIT/ML ~~LOC~~ SOLN
5.0000 [IU] | Freq: Every day | SUBCUTANEOUS | Status: DC
  Administered 2019-05-02 – 2019-05-04 (×4): 5 [IU] via SUBCUTANEOUS
  Filled 2019-05-02 (×6): qty 0.05

## 2019-05-02 MED ORDER — INSULIN ASPART 100 UNIT/ML ~~LOC~~ SOLN
0.0000 [IU] | Freq: Three times a day (TID) | SUBCUTANEOUS | Status: DC
  Administered 2019-05-02 (×2): 2 [IU] via SUBCUTANEOUS
  Administered 2019-05-02: 3 [IU] via SUBCUTANEOUS
  Filled 2019-05-02 (×3): qty 1

## 2019-05-02 MED ORDER — FUROSEMIDE 10 MG/ML IJ SOLN
40.0000 mg | Freq: Once | INTRAMUSCULAR | Status: AC
  Administered 2019-05-02: 40 mg via INTRAVENOUS
  Filled 2019-05-02: qty 4

## 2019-05-02 MED ORDER — MAGNESIUM SULFATE 2 GM/50ML IV SOLN
2.0000 g | Freq: Once | INTRAVENOUS | Status: AC
  Administered 2019-05-03: 2 g via INTRAVENOUS
  Filled 2019-05-02: qty 50

## 2019-05-02 MED ORDER — INSULIN ASPART 100 UNIT/ML ~~LOC~~ SOLN
0.0000 [IU] | Freq: Three times a day (TID) | SUBCUTANEOUS | Status: DC
  Administered 2019-05-02: 22:00:00 3 [IU] via SUBCUTANEOUS
  Administered 2019-05-03 (×3): 2 [IU] via SUBCUTANEOUS
  Administered 2019-05-03 – 2019-05-04 (×2): 3 [IU] via SUBCUTANEOUS
  Administered 2019-05-04 (×3): 2 [IU] via SUBCUTANEOUS
  Filled 2019-05-02 (×9): qty 1

## 2019-05-02 MED ORDER — SODIUM CHLORIDE 0.9 % IV SOLN
12.5000 mg | Freq: Once | INTRAVENOUS | Status: AC
  Administered 2019-05-03: 02:00:00 12.5 mg via INTRAVENOUS
  Filled 2019-05-02: qty 0.5

## 2019-05-02 NOTE — ED Notes (Addendum)
Pt lying in bed, c/o abdominal pain and back pain, IVF completed, new bag of D5 1/2 NS started.    Pt on room air, asking for water, CBG checked as well.  No s/s of respiratory distress, oxygen saturation in the 90s, no cough present.  Will continue to monitor.

## 2019-05-02 NOTE — ED Notes (Signed)
Pt has met criteria and CBG consistently below 180. Gap has closed to 12. Received verbal orders from NP Sharion Settler to remove from insulin drip 2 hours post lantus.

## 2019-05-02 NOTE — Progress Notes (Signed)
1        [facility] [place] NAME: Kelsen Keenan    MR#:  [id number]  DATE OF BIRTH:  1955-06-05  SUBJECTIVE:  CHIEF COMPLAINT:   Chief Complaint  Patient presents with  . Abdominal Pain  having 8-9/10 abd pain REVIEW OF SYSTEMS:  Review of Systems  Constitutional: Negative for diaphoresis, fever, malaise/fatigue and weight loss.  HENT: Negative for ear discharge, ear pain, hearing loss, nosebleeds, sore throat and tinnitus.   Eyes: Negative for blurred vision and pain.  Respiratory: Negative for cough, hemoptysis, shortness of breath and wheezing.   Cardiovascular: Negative for chest pain, palpitations, orthopnea and leg swelling.  Gastrointestinal: Positive for abdominal pain. Negative for blood in stool, constipation, diarrhea, heartburn, nausea and vomiting.  Genitourinary: Negative for dysuria, frequency and urgency.  Musculoskeletal: Negative for back pain and myalgias.  Skin: Negative for itching and rash.  Neurological: Negative for dizziness, tingling, tremors, focal weakness, seizures, weakness and headaches.  Psychiatric/Behavioral: Negative for depression. The patient is not nervous/anxious.    DRUG ALLERGIES:  No Known Allergies VITALS:  Blood pressure (!) 114/57, pulse 98, temperature 98.9 F (37.2 C), temperature source Oral, resp. rate (!) 25, height 5\' 9"  (1.753 m), weight 74.8 kg, SpO2 96 %. PHYSICAL EXAMINATION:  Physical Exam HENT:     Head: Normocephalic and atraumatic.  Eyes:     Conjunctiva/sclera: Conjunctivae normal.     Pupils: Pupils are equal, round, and reactive to light.  Neck:     Thyroid: No thyromegaly.     Trachea: No tracheal deviation.  Cardiovascular:     Rate and Rhythm: Normal rate and regular rhythm.     Heart sounds: Normal heart sounds.  Pulmonary:     Effort: Pulmonary effort is normal. No respiratory distress.     Breath sounds: Normal breath sounds. No wheezing.  Chest:     Chest wall: No  tenderness.  Abdominal:     General: Bowel sounds are normal. There is no distension.     Palpations: Abdomen is soft.     Tenderness: There is generalized abdominal tenderness.  Musculoskeletal:        General: Normal range of motion.     Cervical back: Normal range of motion and neck supple.  Skin:    General: Skin is warm and dry.     Findings: No rash.  Neurological:     Mental Status: He is alert and oriented to person, place, and time.     Cranial Nerves: No cranial nerve deficit.    LABORATORY PANEL:  Male CBC Recent Labs  Lab 05/01/19 1317  WBC 10.3  HGB 14.0  HCT 40.5  PLT 239   ------------------------------------------------------------------------------------------------------------------ Chemistries  Recent Labs  Lab 05/01/19 1317 05/01/19 1826 05/02/19 0931  NA 131*   < > 133*  K 4.4   < > 3.4*  CL 92*   < > 100  CO2 20*   < > 25  GLUCOSE 350*   < > 210*  BUN 25*   < > 14  CREATININE 1.10   < > 0.59*  CALCIUM 8.6*   < > 8.2*  AST 24  --   --   ALT 14  --   --   ALKPHOS 73  --   --   BILITOT 1.8*  --   --    < > = values in this interval not displayed.   RADIOLOGY:  CT Abdomen Pelvis W Contrast  Result Date: 05/01/2019 CLINICAL DATA:  Abdominal pain with nausea and vomiting for 2 days. History of diabetes. EXAM: CT ABDOMEN AND PELVIS WITH CONTRAST TECHNIQUE: Multidetector CT imaging of the abdomen and pelvis was performed using the standard protocol following bolus administration of intravenous contrast. CONTRAST:  132mL OMNIPAQUE IOHEXOL 300 MG/ML  SOLN COMPARISON:  Chest radiographs and abdominopelvic CT 12/04/2018. FINDINGS: Lower chest: There are extensive patchy ground-glass opacities at both lung bases which are new and suspicious for viral pneumonia. No consolidation, pleural or pericardial effusion. Hepatobiliary: The liver is normal in density without suspicious focal abnormality. No evidence of gallstones, gallbladder wall thickening or  biliary dilatation. Pancreas: Mildly atrophied. There is mildly increased dilatation of the main pancreatic duct, but no evidence of pancreatic mass or surrounding inflammatory change. Spleen: Normal in size without focal abnormality. Adrenals/Urinary Tract: Both adrenal glands appear normal. Stable bilateral renal cysts. There are probable small nonobstructing bilateral renal calculi. The urinary bladder is markedly distended, and there is mild bilateral pelvicaliectasis. No evidence of ureteral calculus or focal perinephric fluid collection. Stomach/Bowel: No evidence of bowel wall thickening, distention or surrounding inflammatory change. The appendix is not clearly visualized. Vascular/Lymphatic: Multiple enlarged retroperitoneal lymph nodes are again noted, similar to the previous study. Representative nodes include a 2.1 cm short axis left periaortic node on image 37/2 and a 1.8 cm right periaortic node on image 39/2. Probable enlarged mesenteric lymph nodes, suboptimally evaluated due to the lack of enteric contrast and paucity of intra-abdominal fat. Mild aortic and branch vessel atherosclerosis. The portal, superior mesenteric and splenic veins are patent. Reproductive: Mild enlargement of the prostate gland with central calcifications. Other: Paucity of intra-abdominal fat. No ascites, focal extraluminal fluid collection or free air. Musculoskeletal: No acute or significant osseous findings. Lower lumbar spondylosis and asymmetric right hip arthropathy. IMPRESSION: 1. Extensive patchy ground-glass opacities at both lung bases, new and suspicious for viral pneumonia. Consider chest radiographs and COVID-19 testing. 2. No significant change in multiple enlarged retroperitoneal and mesenteric lymph nodes. Based on the size of these nodes, a lymphoproliferative process remains a distinct possibility, although they could be reactive given their stability over 5 months. 3. Markedly distended urinary bladder with  mild bilateral pelvicaliectasis suggesting chronic bladder outlet obstruction. No evidence of ureteral calculus or focal perinephric fluid collection. 4. Aortic Atherosclerosis (ICD10-I70.0). Electronically Signed   By: Richardean Sale M.D.   On: 05/01/2019 17:45   ASSESSMENT AND PLAN:  Victor Arnold. is a 64 y.o. male with medical history significant for type 2 diabetes, hyperlipidemia and GERD who presents with concerns of abdominal pain  Hyperglycemia/early DKA DKA resolved  Acute on chronic abdominal pain No clear etiology.  CT not showing any acute pathology -Check amylase and lipase.  Will get surgical consultation -CT is showing multiple enlarged retroperitoneal and mesenteric lymph node concerning for lymphoproliferative process - Oncology consultation  COVID infection Not hypoxic.  Continue pulse oximetry monitoring Continue Decadron Follow inflammatory labs  Chronic bladder outlet obstruction Unclear etiology. No acute symptoms at this time. Need follow-up outpatient.  Hypokalemia Replete and recheck  Hypertension Resume antihypertensive when able to tolerate PO  Hyperlipidemia Resume statin when able to tolerate PO   Status is: Inpatient  Remains inpatient appropriate because:Ongoing active pain requiring inpatient pain management   Dispo: The patient is from: Home              Anticipated d/c is to: Home  Anticipated d/c date is: 2 days              Patient currently is not medically stable to d/c.    DVT prophylaxis: Lovenox Family Communication: discussed with patient   All the records are reviewed and case discussed with Care Management/Social Worker. Management plans discussed with the patient, nursing and they are in agreement.  CODE STATUS: Full Code  TOTAL TIME TAKING CARE OF THIS PATIENT: 35 minutes.   More than 50% of the time was spent in counseling/coordination of care: YES  POSSIBLE D/C IN 1-2 DAYS, DEPENDING ON  CLINICAL CONDITION.   Max Sane M.D on 05/02/2019 at 12:39 PM  [facility]   CC: Primary care physician; Cletis Athens, MD  Note: This dictation was prepared with Dragon dictation along with smaller phrase technology. Any transcriptional errors that result from this process are unintentional.

## 2019-05-02 NOTE — ED Notes (Signed)
Pt belongings at bedside, shoes, clothes, glasses and cell phone.

## 2019-05-02 NOTE — Consult Note (Signed)
Patient ID: Victor Arnold., male   DOB: November 18, 1955, 64 y.o.   MRN: [id number]  HPI [name]. is a 64 y.o. male asked to see in consultation by Dr. Manuella Ghazi for abdominal pain.  He has a history of a 3-day abdominal pain.  The pain is moderate to severe in nature he reports as diffuse intermittent and sharp.  Apparently had nausea and vomiting but when I examined him he just had some lunch with milk.  Initially she he came in with some mild acidosis and hyperglycemia.  He does have significant history of diabetes.  He also had chest x-ray that I personally reviewed showing bilateral infiltrates concerning for pneumonia and he does have positive for Covid.  CT scan personally reviewed showing no evidence of any acute intra-abdominal pathology.  KUB again ordered today and reviewed showing no abnormalities.  His BMP today was normal. CBC yesterday was normal.   HPI  Past Medical History:  Diagnosis Date  . Diabetes mellitus without complication ([place])   . GERD (gastroesophageal reflux disease)   . Hypertension     Past Surgical History:  Procedure Laterality Date  . CATARACT EXTRACTION W/PHACO Right 11/04/2017   Procedure: CATARACT EXTRACTION PHACO AND INTRAOCULAR LENS PLACEMENT ([place]);  Surgeon: Birder Robson, MD;  Location: [facility] ORS;  Service: Ophthalmology;  Laterality: Right;  [place]  01:46 CDE 9.00 Fluid pack lot # [id number]  . CATARACT EXTRACTION W/PHACO Left 12/02/2017   Procedure: CATARACT EXTRACTION PHACO AND INTRAOCULAR LENS PLACEMENT ([facility]);  Surgeon: Birder Robson, MD;  Location: [facility] ORS;  Service: Ophthalmology;  Laterality: Left;  [place] 01:26.9 CDE 9.19 Fluid pack lot # [id number]  . COLONOSCOPY WITH PROPOFOL N/A 08/26/2017   Procedure: COLONOSCOPY WITH PROPOFOL;  Surgeon: Lin Landsman, MD;  Location: [facility] ENDOSCOPY;  Service: Gastroenterology;  Laterality: N/A;  . NO PAST SURGERIES      History reviewed. No pertinent family history.  Social History Social History    Tobacco Use  . Smoking status: Never Smoker  . Smokeless tobacco: Never Used  Substance Use Topics  . Alcohol use: Not Currently  . Drug use: Not Currently    No Known Allergies  Current Facility-Administered Medications  Medication Dose Route Frequency Provider Last Rate Last Admin  . acetaminophen (TYLENOL) tablet 650 mg  650 mg Oral Q6H PRN Tu, Ching T, DO      . dexamethasone (DECADRON) injection 6 mg  6 mg Intravenous Q24H Tu, Ching T, DO      . dextrose 5 %-0.45 % sodium chloride infusion   Intravenous Continuous Blake Divine, MD 75 mL/hr at 05/02/19 0933 Rate Verify at 05/02/19 0933  . dextrose 50 % solution 0-50 mL  0-50 mL Intravenous PRN Blake Divine, MD      . enoxaparin (LOVENOX) injection 40 mg  40 mg Subcutaneous Q24H Tu, Ching T, DO   40 mg at 05/02/19 0925  . insulin aspart (novoLOG) injection 0-9 Units  0-9 Units Subcutaneous TID WC Sharion Settler, NP   2 Units at 05/02/19 1835  . insulin glargine (LANTUS) injection 5 Units  5 Units Subcutaneous QHS Sharion Settler, NP   5 Units at 05/02/19 0127  . morphine 2 MG/ML injection 2 mg  2 mg Intravenous Q3H PRN Sharion Settler, NP   2 mg at 05/02/19 1834  . ondansetron (ZOFRAN) injection 4 mg  4 mg Intravenous Q6H PRN Tu, Ching T, DO   4 mg at 05/02/19 1321     Review of Systems  Full ROS  was asked and was negative except for the information on the HPI  Physical Exam Blood pressure (!) 114/57, pulse 98, temperature 98.9 F (37.2 C), temperature source Oral, resp. rate (!) 25, height 5\' 9"  (1.753 m), weight 74.8 kg, SpO2 96 %. CONSTITUTIONAL: NAD EYES: Pupils are equal, round, and reactive to light, Sclera are non-icteric. EARS, NOSE, MOUTH AND THROAT: T he is wearing a mask. Hearing is intact to voice. LYMPH NODES:  Lymph nodes in the neck are normal. RESPIRATORY:  Lungs are clear. There is normal respiratory effort, with equal breath sounds bilaterally, and without pathologic use of accessory  muscles. CARDIOVASCULAR: Heart is regular without murmurs, gallops, or rubs. GI: The abdomen is  soft, diffuse tenderness to palpation with some voluntary guarding but without peritonitis. There are no palpable masses. There is no hepatosplenomegaly. There are normal bowel sounds in all quadrants. GU: Rectal deferred.   MUSCULOSKELETAL: Normal muscle strength and tone. No cyanosis or edema.   SKIN: Turgor is good and there are no pathologic skin lesions or ulcers. NEUROLOGIC: Motor and sensation is grossly normal. Cranial nerves are grossly intact. PSYCH:  Oriented to person, place and time. Affect is normal.  Data Reviewed I have personally reviewed the patient's imaging, laboratory findings and medical records.    Assessment/Plan 64 year old male with significant comorbidities including Covid infection with abdominal pain of no clear etiology.  His can certainly be caused by Covid infection differential diagnosis will include withdrawal symptom from narcotics versus versus DKA versus physiologic in nature.  Currently no need for surgical intervention.  I do think that he will benefit from GI evaluation given the lack of anatomical pathology based on imaging studies. And in the meantime I am going to keep him n.p.o. and repeat an abdominal film in the morning.  Suspect this is more physiologic.  If no further pathology on imaging studies can probably resume diet and be followed by GI. DisCussed the case in detail with Dr. Manuella Ghazi.     Caroleen Hamman, MD FACS General Surgeon 05/02/2019, 7:35 PM

## 2019-05-02 NOTE — ED Notes (Signed)
Noted patient at 88% o2 on room air. Placed on 2L nasal cannula. Dr Beather Arbour notified.

## 2019-05-03 ENCOUNTER — Inpatient Hospital Stay: Payer: Medicare Other

## 2019-05-03 DIAGNOSIS — R160 Hepatomegaly, not elsewhere classified: Secondary | ICD-10-CM

## 2019-05-03 DIAGNOSIS — R1012 Left upper quadrant pain: Secondary | ICD-10-CM

## 2019-05-03 LAB — CBC
HCT: 34.4 % — ABNORMAL LOW (ref 39.0–52.0)
Hemoglobin: 12.5 g/dL — ABNORMAL LOW (ref 13.0–17.0)
MCH: 30.9 pg (ref 26.0–34.0)
MCHC: 36.3 g/dL — ABNORMAL HIGH (ref 30.0–36.0)
MCV: 84.9 fL (ref 80.0–100.0)
Platelets: 232 10*3/uL (ref 150–400)
RBC: 4.05 MIL/uL — ABNORMAL LOW (ref 4.22–5.81)
RDW: 11.9 % (ref 11.5–15.5)
WBC: 7.1 10*3/uL (ref 4.0–10.5)
nRBC: 0 % (ref 0.0–0.2)

## 2019-05-03 LAB — COMPREHENSIVE METABOLIC PANEL
ALT: 14 U/L (ref 0–44)
AST: 32 U/L (ref 15–41)
Albumin: 2.4 g/dL — ABNORMAL LOW (ref 3.5–5.0)
Alkaline Phosphatase: 55 U/L (ref 38–126)
Anion gap: 8 (ref 5–15)
BUN: 14 mg/dL (ref 8–23)
CO2: 24 mmol/L (ref 22–32)
Calcium: 8.1 mg/dL — ABNORMAL LOW (ref 8.9–10.3)
Chloride: 100 mmol/L (ref 98–111)
Creatinine, Ser: 0.69 mg/dL (ref 0.61–1.24)
GFR calc Af Amer: >60 mL/min (ref >60)
GFR calc non Af Amer: >60 mL/min (ref >60)
Glucose, Bld: 158 mg/dL — ABNORMAL HIGH (ref 70–99)
Potassium: 3.4 mmol/L — ABNORMAL LOW (ref 3.5–5.1)
Sodium: 132 mmol/L — ABNORMAL LOW (ref 135–145)
Total Bilirubin: 0.8 mg/dL (ref 0.3–1.2)
Total Protein: 6.5 g/dL (ref 6.5–8.1)

## 2019-05-03 LAB — GLUCOSE, CAPILLARY
Glucose-Capillary: 194 mg/dL — ABNORMAL HIGH (ref 70–99)
Glucose-Capillary: 158 mg/dL — ABNORMAL HIGH (ref 70–99)
Glucose-Capillary: 242 mg/dL — ABNORMAL HIGH (ref 70–99)
Glucose-Capillary: 182 mg/dL — ABNORMAL HIGH (ref 70–99)

## 2019-05-03 LAB — BRAIN NATRIURETIC PEPTIDE: B Natriuretic Peptide: 105 pg/mL — ABNORMAL HIGH (ref 0.0–100.0)

## 2019-05-03 LAB — TROPONIN I (HIGH SENSITIVITY)
Troponin I (High Sensitivity): 8 ng/L (ref <18)
Troponin I (High Sensitivity): 9 ng/L (ref <18)

## 2019-05-03 LAB — HEMOGLOBIN A1C
Hgb A1c MFr Bld: >15.5 % — ABNORMAL HIGH (ref 4.8–5.6)
Mean Plasma Glucose: >398 mg/dL

## 2019-05-03 MED ORDER — HYDROCODONE-ACETAMINOPHEN 7.5-325 MG PO TABS
1.0000 | ORAL_TABLET | Freq: Four times a day (QID) | ORAL | Status: DC | PRN
  Administered 2019-05-03 – 2019-05-04 (×4): 1 via ORAL
  Filled 2019-05-03 (×4): qty 1

## 2019-05-03 MED ORDER — PANTOPRAZOLE SODIUM 40 MG IV SOLR
40.0000 mg | Freq: Two times a day (BID) | INTRAVENOUS | Status: DC
  Administered 2019-05-03 – 2019-05-04 (×4): 40 mg via INTRAVENOUS
  Filled 2019-05-03 (×4): qty 40

## 2019-05-03 MED ORDER — BISACODYL 5 MG PO TBEC
5.0000 mg | DELAYED_RELEASE_TABLET | Freq: Every day | ORAL | Status: DC | PRN
  Administered 2019-05-03: 23:00:00 5 mg via ORAL
  Filled 2019-05-03: qty 1

## 2019-05-03 MED ORDER — POTASSIUM CHLORIDE IN NACL 20-0.9 MEQ/L-% IV SOLN
INTRAVENOUS | Status: DC
  Filled 2019-05-03 (×3): qty 1000

## 2019-05-03 MED ORDER — MORPHINE SULFATE (PF) 2 MG/ML IV SOLN
2.0000 mg | Freq: Once | INTRAVENOUS | Status: AC
  Administered 2019-05-03: 01:00:00 2 mg via INTRAVENOUS

## 2019-05-03 MED ORDER — MAGNESIUM HYDROXIDE 400 MG/5ML PO SUSP
30.0000 mL | Freq: Once | ORAL | Status: AC
  Administered 2019-05-03: 23:00:00 30 mL via ORAL
  Filled 2019-05-03: qty 30

## 2019-05-03 MED ORDER — ALBUMIN HUMAN 25 % IV SOLN
25.0000 g | Freq: Once | INTRAVENOUS | Status: AC
  Administered 2019-05-03: 25 g via INTRAVENOUS
  Filled 2019-05-03: qty 100

## 2019-05-03 MED ORDER — GADOBUTROL 1 MMOL/ML IV SOLN
7.0000 mL | Freq: Once | INTRAVENOUS | Status: AC | PRN
  Administered 2019-05-03: 12:00:00 7 mL via INTRAVENOUS

## 2019-05-03 MED ORDER — MORPHINE SULFATE (PF) 2 MG/ML IV SOLN
2.0000 mg | INTRAVENOUS | Status: DC | PRN
  Administered 2019-05-03 – 2019-05-04 (×2): 2 mg via INTRAVENOUS
  Filled 2019-05-03 (×2): qty 1

## 2019-05-03 NOTE — Progress Notes (Signed)
1         at [place] NAME: Victor Arnold    MR#:  [id number]  DATE OF BIRTH:  04-30-1955  SUBJECTIVE:  CHIEF COMPLAINT:   Chief Complaint  Patient presents with  . Abdominal Pain  having 8-9/10 abd pain.  Would like to eat/drink.  Tachycardic REVIEW OF SYSTEMS:  Review of Systems  Constitutional: Negative for diaphoresis, fever, malaise/fatigue and weight loss.  HENT: Negative for ear discharge, ear pain, hearing loss, nosebleeds, sore throat and tinnitus.   Eyes: Negative for blurred vision and pain.  Respiratory: Negative for cough, hemoptysis, shortness of breath and wheezing.   Cardiovascular: Negative for chest pain, palpitations, orthopnea and leg swelling.  Gastrointestinal: Positive for abdominal pain. Negative for blood in stool, constipation, diarrhea, heartburn, nausea and vomiting.  Genitourinary: Negative for dysuria, frequency and urgency.  Musculoskeletal: Negative for back pain and myalgias.  Skin: Negative for itching and rash.  Neurological: Negative for dizziness, tingling, tremors, focal weakness, seizures, weakness and headaches.  Psychiatric/Behavioral: Negative for depression. The patient is not nervous/anxious.    DRUG ALLERGIES:  No Known Allergies VITALS:  Blood pressure 137/71, pulse (!) 112, temperature 98.1 F (36.7 C), temperature source Oral, resp. rate 16, height 5\' 9"  (1.753 m), weight 74.8 kg, SpO2 92 %. PHYSICAL EXAMINATION:  Physical Exam HENT:     Head: Normocephalic and atraumatic.  Eyes:     Conjunctiva/sclera: Conjunctivae normal.     Pupils: Pupils are equal, round, and reactive to light.  Neck:     Thyroid: No thyromegaly.     Trachea: No tracheal deviation.  Cardiovascular:     Rate and Rhythm: Normal rate and regular rhythm.     Heart sounds: Normal heart sounds.  Pulmonary:     Effort: Pulmonary effort is normal. No respiratory distress.     Breath sounds: Normal breath sounds. No wheezing.    Chest:     Chest wall: No tenderness.  Abdominal:     General: Bowel sounds are normal. There is no distension.     Palpations: Abdomen is soft.     Tenderness: There is generalized abdominal tenderness.  Musculoskeletal:        General: Normal range of motion.     Cervical back: Normal range of motion and neck supple.  Skin:    General: Skin is warm and dry.     Findings: No rash.  Neurological:     Mental Status: He is alert and oriented to person, place, and time.     Cranial Nerves: No cranial nerve deficit.    LABORATORY PANEL:  Male CBC Recent Labs  Lab 05/03/19 0208  WBC 7.1  HGB 12.5*  HCT 34.4*  PLT 232   ------------------------------------------------------------------------------------------------------------------ Chemistries  Recent Labs  Lab 05/02/19 2058 05/02/19 2058 05/03/19 0208  NA 131*   < > 132*  K 3.5   < > 3.4*  CL 100   < > 100  CO2 22   < > 24  GLUCOSE 199*   < > 158*  BUN 13   < > 14  CREATININE 0.63   < > 0.69  CALCIUM 8.3*   < > 8.1*  MG 1.8  --   --   AST  --   --  32  ALT  --   --  14  ALKPHOS  --   --  55  BILITOT  --   --  0.8   < > =  values in this interval not displayed.   RADIOLOGY:  DG Abd Portable 2V  Result Date: 05/03/2019 CLINICAL DATA:  Abdominal pain EXAM: PORTABLE ABDOMEN - 2 VIEW COMPARISON:  May 02, 2019 FINDINGS: There is moderate stool in the colon. There is no bowel dilatation or air-fluid level to suggest bowel obstruction. No free air. There is atelectatic change in the left lung base. IMPRESSION: No bowel obstruction or free air evident.  Moderate stool in colon. Electronically Signed   By: Lowella Grip III M.D.   On: 05/03/2019 09:03   DG Abd Portable 2V  Result Date: 05/02/2019 CLINICAL DATA:  64 year old male with abdominal pain. Positive COVID-19. EXAM: PORTABLE ABDOMEN - 2 VIEW COMPARISON:  CT abdomen pelvis dated 05/01/2019. FINDINGS: There is no bowel dilatation or evidence of obstruction. No  free air. Punctate radiopaque foci over the left renal silhouette may correspond to the stone seen on the prior CT. There is degenerative changes of the spine and hips. No acute osseous pathology. Bilateral confluent airspace opacities in keeping with known COVID-19. IMPRESSION: 1. No evidence of bowel obstruction. 2. Bilateral confluent airspace opacities in keeping with known COVID-19. Electronically Signed   By: Anner Crete M.D.   On: 05/02/2019 17:46   US Abdomen Limited RUQ  Result Date: 05/03/2019 CLINICAL DATA:  Pain. EXAM: ULTRASOUND ABDOMEN LIMITED RIGHT UPPER QUADRANT COMPARISON:  05/03/2019 abdominal radiographs. 05/01/2019 CT abdomen pelvis. FINDINGS: Gallbladder: No gallstones or wall thickening visualized. No sonographic Murphy sign noted by sonographer. Common bile duct: Diameter: 3.4 mm.  Within normal limits. Liver: 18.3 x 11.6 x 12.7 mm hyperechoic right hepatic mass which demonstrates internal vascularity on Doppler imaging. Diffusely increased hepatic parenchymal echogenicity. Portal vein is patent on color Doppler imaging with normal direction of blood flow towards the liver. Other: No ascites. IMPRESSION: 18.3 cm hyperechoic right hepatic mass. Differential includes a benign hemangioma. Please note that MRI is more sensitive in its evaluation of focal hepatic lesions. Hepatic steatosis. Electronically Signed   By: Primitivo Gauze M.D.   On: 05/03/2019 09:33   ASSESSMENT AND PLAN:  Victor Arnold. is a 64 y.o. male with medical history significant for type 2 diabetes, hyperlipidemia and GERD who presents with concerns of abdominal pain  Hyperglycemia/early DKA DKA resolved  Acute on chronic abdominal pain No clear etiology.  CT not showing any acute pathology - amylase and lipase within normal limit.  Appreciate surgical consultation.  Requesting GI consultation -CT is showing multiple enlarged retroperitoneal and mesenteric lymph node concerning for  lymphoproliferative process - Oncology consultation -Right upper quadrant ultrasound showed 18.3 cm right hepatic mass - Abdominal x-ray within normal limits -As needed pain medication for symptom management for now  COVID infection Not hypoxic.  Continue pulse oximetry monitoring Stopping Decadron as he is on room air Follow inflammatory labs  Chronic bladder outlet obstruction Unclear etiology. No acute symptoms at this time. Need follow-up outpatient.  Hypokalemia Replete and recheck  Hypertension Resume antihypertensive when able to tolerate PO  Hyperlipidemia Resume statin when able to tolerate PO   Status is: Inpatient  Remains inpatient appropriate because:Ongoing active pain requiring inpatient pain management   Dispo: The patient is from: Home              Anticipated d/c is to: Home              Anticipated d/c date is: 2 days              Patient currently  is not medically stable to d/c.    DVT prophylaxis: Lovenox Family Communication: discussed with patient.  I tried to call his sister - no one picked up   All the records are reviewed and case discussed with Care Management/Social Worker. Management plans discussed with the patient, nursing and they are in agreement.  CODE STATUS: Full Code  TOTAL TIME TAKING CARE OF THIS PATIENT: 35 minutes.   More than 50% of the time was spent in counseling/coordination of care: YES  POSSIBLE D/C IN 2-3 DAYS, DEPENDING ON CLINICAL CONDITION.   Max Sane M.D on 05/03/2019 at 12:56 PM  [facility]   CC: Primary care physician; Cletis Athens, MD  Note: This dictation was prepared with Dragon dictation along with smaller phrase technology. Any transcriptional errors that result from this process are unintentional.

## 2019-05-03 NOTE — Progress Notes (Deleted)
   05/03/19 0737  Assess: MEWS Score  Pulse Rate (!) 110  ECG Heart Rate (!) 112  Resp (!) 23  Level of Consciousness Alert  SpO2 94 %  O2 Device Room Air  Assess: MEWS Score  MEWS Temp 0  MEWS Systolic 0  MEWS Pulse 2  MEWS RR 1  MEWS LOC 0  MEWS Score 3  MEWS Score Color Yellow  Assess: if the MEWS score is Yellow or Red  Were vital signs taken at a resting state? Yes  Focused Assessment Documented focused assessment  Early Detection of Sepsis Score *See Row Information* Low  MEWS guidelines implemented *See Row Information* Yes  Treat  MEWS Interventions Other (Comment) (known clinical condition, MD notified. )  Take Vital Signs  Increase Vital Sign Frequency  Yellow: Q 2hr X 2 then Q 4hr X 2, if remains yellow, continue Q 4hrs  Escalate  MEWS: Escalate Yellow: discuss with charge nurse/RN and consider discussing with provider and RRT  Notify: Charge Nurse/RN  Name of Charge Nurse/RN Notified Caryl Pina, rn   Date Charge Nurse/RN Notified 05/03/19  Time Charge Nurse/RN Notified 0730  Notify: Provider  Provider Name/Title Dr. Manuella Ghazi   Date Provider Notified 05/03/19  Time Provider Notified 0730  Notification Type Page  Notification Reason Other (Comment) (Yellow MEWS score )  Response No new orders  Date of Provider Response 05/03/19  Time of Provider Response [phone]  Document  Patient Outcome Other (Comment) (no change)  Progress note created (see row info) Yes

## 2019-05-03 NOTE — Progress Notes (Signed)
Ch spoke with Pt over the phone. Pt sounded sad. Ch's call made him worry about his condition. Ch assured Pt that it is part of regular rounding. Pt requested prayer to get well and go home. Ch prayed with Pt.

## 2019-05-03 NOTE — Progress Notes (Signed)
Inpatient Diabetes Program Recommendations  AACE/ADA: New Consensus Statement on Inpatient Glycemic Control   Target Ranges:  Prepandial:   less than 140 mg/dL      Peak postprandial:   less than 180 mg/dL (1-2 hours)      Critically ill patients:  140 - 180 mg/dL  Results for Conkel, Jakarri A JR. Milus Banister (MRN [id number]) as of 05/03/2019 08:08  Ref. Range 05/03/2019 02:08  Glucose Latest Ref Range: 70 - 99 mg/dL 158 (H)   Results for Tuazon, Lumir A JR. Milus Banister (MRN [id number]) as of 05/03/2019 08:08  Ref. Range 05/02/2019 04:12 05/02/2019 07:58 05/02/2019 12:26 05/02/2019 17:31 05/02/2019 21:11  Glucose-Capillary Latest Ref Range: 70 - 99 mg/dL 126 (H) 186 (H) 205 (H) 276 (H) 208 (H)   Review of Glycemic Control  Diabetes history: DM2 Outpatient Diabetes medications: Glipizide XL 5 mg daily, Metformin 500 mg BID Current orders for Inpatient glycemic control: Lantus 5 units QHS, Novolog 0-9 units AC&HS; Decadron 6 mg Q24H  Inpatient Diabetes Program Recommendations:    Insulin-Correction: Please consider increasing Novolog correction to moderate scale and change to Q4H (Novolog 0-15 units Q4H since patient is NPO).  HbgA1C:  A1C in process.  Thanks, Barnie Alderman, RN, MSN, CDE Diabetes Coordinator Inpatient Diabetes Program [phone] (Team Pager from 8am to 5pm)

## 2019-05-03 NOTE — Progress Notes (Signed)
Patient's Mews went up to a 5 due to elevated HR, low BP and inc. Respirations. Sharion Settler, NP on call was notified. She came to unit and placed orders. Vital signs were taken again, and mews down to a 3. Will complete orders from NP and continue to monitor.

## 2019-05-03 NOTE — Plan of Care (Signed)
  Problem: Education: Goal: Knowledge of risk factors and measures for prevention of condition will improve Outcome: Progressing   

## 2019-05-03 NOTE — Consult Note (Addendum)
Victor Darby, MD [address]  [place]  [place], [place] [address]  Main: [phone]  Fax: [phone] Pager: [phone]   Consultation  Referring Provider:     No ref. provider found Primary Care Physician:  Cletis Athens, MD Primary Gastroenterologist: Sherri Sear    Reason for Consultation:     Left upper quadrant pain  Date of Admission:  05/01/2019 Date of Consultation:  05/03/2019         HPI:   Victor [place]. is a 64 y.o. male with history of diabetes, reflux, hypertension who is admitted with left upper quadrant abdominal pain, feels like knot in his stomach associated with nausea, vomiting and diarrhea.  Labs revealed hyperglycemia/early DKA.  He also had bilateral infiltrates in lung bases and COVID-19 test came back positive.  He had SIRS on presentation, resuscitated with IV fluids.  On admission, he had CT abdomen and pelvis with contrast for acute abdominal pain which did not reveal any acute intra-abdominal pathology other than retroperitoneal lymphadenopathy.  Surgery was consulted yesterday due to abdominal pain.  No surgical intervention was recommended.  There was no evidence of free air on KUB.  Patient had history of constipation and showed moderate stool burden.  He also had right upper quadrant ultrasound which incidentally showed liver lesion.  Subsequently, underwent MRI abdomen with and without contrast which confirmed 9 mm hemangioma in right liver lobe.  When I interviewed the patient, he was in mild distress and restless as he is NPO.  He wants to drink some liquids.  Hyperglycemia is improving.  Does not have AKI.  Serum lipase is normal, normal LFTs Patient reports that he has left upper quadrant discomfort which is chronic.  NSAIDs: None  Antiplts/Anticoagulants/Anti thrombotics: None  GI Procedures: Colonoscopy in 2019 - Perianal skin tags found on perianal exam. - One 8 mm polyp in the distal sigmoid colon, removed with a hot  snare. Resected and retrieved. - One 6 mm polyp in the rectum, removed with a hot snare. Resected and retrieved. - External hemorrhoids.  IAGNOSIS:  A. COLON POLYP X2, SIGMOID; HOT SNARE:  - TUBULAR ADENOMA (MULTIPLE FRAGMENTS).  - SERRATED POLYP, CANNOT EXCLUDE EARLY SESSILE SERRATED ADENOMA (1).  - NEGATIVE FOR HIGH-GRADE DYSPLASIA AND MALIGNANCY.   Past Medical History:  Diagnosis Date  . Diabetes mellitus without complication ([place])   . GERD (gastroesophageal reflux disease)   . Hypertension     Past Surgical History:  Procedure Laterality Date  . CATARACT EXTRACTION W/PHACO Right 11/04/2017   Procedure: CATARACT EXTRACTION PHACO AND INTRAOCULAR LENS PLACEMENT ([place]);  Surgeon: Birder Robson, MD;  Location: [facility] ORS;  Service: Ophthalmology;  Laterality: Right;  [place]  01:46 CDE 9.00 Fluid pack lot # [id number]  . CATARACT EXTRACTION W/PHACO Left 12/02/2017   Procedure: CATARACT EXTRACTION PHACO AND INTRAOCULAR LENS PLACEMENT ([facility]);  Surgeon: Birder Robson, MD;  Location: [facility] ORS;  Service: Ophthalmology;  Laterality: Left;  [place] 01:26.9 CDE 9.19 Fluid pack lot # [id number]  . COLONOSCOPY WITH PROPOFOL N/A 08/26/2017   Procedure: COLONOSCOPY WITH PROPOFOL;  Surgeon: Lin Landsman, MD;  Location: [facility];  Service: Gastroenterology;  Laterality: N/A;  . NO PAST SURGERIES      Prior to Admission medications   Medication Sig Start Date End Date Taking? Authorizing Provider  glipiZIDE (GLUCOTROL XL) 5 MG 24 hr tablet Take 5 mg by mouth daily. 12/29/17  Yes [provider]  lisinopril (ZESTRIL) 10 MG tablet Take 10  mg by mouth daily. 12/29/17  Yes [provider]  aluminum hydroxide-magnesium carbonate (GAVISCON) 95-358 MG/15ML SUSP Take 15 mLs by mouth as needed for indigestion.    [provider]  atorvastatin (LIPITOR) 20 MG tablet Take 20 mg by mouth daily at 12 noon. 10/06/17   [provider]  metFORMIN (GLUCOPHAGE-XR) 500 MG 24 hr  tablet Take 500 mg by mouth 2 (two) times daily. 09/03/17   [provider]  omeprazole (PRILOSEC) 20 MG capsule Take 20 mg by mouth daily. 10/27/17   [provider]  oxyCODONE-acetaminophen (PERCOCET) 5-325 MG tablet Take 1 tablet by mouth every 4 (four) hours as needed for severe pain. 12/04/18 12/04/19  [name], MD  pregabalin (LYRICA) 50 MG capsule Take 50 mg by mouth every 6 (six) hours. 11/20/17   [provider]  traZODone (DESYREL) 50 MG tablet Take 50 mg by mouth at bedtime.  10/20/17   [provider]    Current Facility-Administered Medications:  .  0.9 % NaCl with KCl 20 mEq/ L  infusion, , Intravenous, Continuous, Manuella Ghazi, Vipul, MD, Last Rate: 50 mL/hr at 05/03/19 1500, Rate Verify at 05/03/19 1500 .  acetaminophen (TYLENOL) tablet 650 mg, 650 mg, Oral, Q6H PRN, Tu, Ching T, DO .  dextrose 50 % solution 0-50 mL, 0-50 mL, Intravenous, PRN, Blake Divine, MD .  enoxaparin (LOVENOX) injection 40 mg, 40 mg, Subcutaneous, Q24H, Tu, Ching T, DO, 40 mg at 05/03/19 0927 .  HYDROcodone-acetaminophen (NORCO) 7.5-325 MG per tablet 1 tablet, 1 tablet, Oral, Q6H PRN, Max Sane, MD, 1 tablet at 05/03/19 1409 .  insulin aspart (novoLOG) injection 0-9 Units, 0-9 Units, Subcutaneous, TID AC & HS, Sharion Settler, NP, 2 Units at 05/03/19 1326 .  insulin glargine (LANTUS) injection 5 Units, 5 Units, Subcutaneous, QHS, Sharion Settler, NP, 5 Units at 05/02/19 2149 .  morphine 2 MG/ML injection 2 mg, 2 mg, Intravenous, Q3H PRN, Manuella Ghazi, Vipul, MD .  ondansetron (ZOFRAN) injection 4 mg, 4 mg, Intravenous, Q6H PRN, Tu, Ching T, DO, 4 mg at 05/02/19 1321 .  pantoprazole (PROTONIX) injection 40 mg, 40 mg, Intravenous, Q12H, Dmetrius Ambs, Tally Due, MD, 40 mg at 05/03/19 1403  History reviewed. No pertinent family history.   Social History   Tobacco Use  . Smoking status: Never Smoker  . Smokeless tobacco: Never Used  Substance Use Topics  . Alcohol use: Not  Currently  . Drug use: Not Currently    Allergies as of 05/01/2019  . (No Known Allergies)    Review of Systems:    All systems reviewed and negative except where noted in HPI.   Physical Exam:  Vital signs in last 24 hours: Temp:  [97.7 F (36.5 C)-99.5 F (37.5 C)] 97.7 F (36.5 C) (04/12 1256) Pulse Rate:  [109-124] 121 (04/12 1500) Resp:  [14-28] 21 (04/12 1500) BP: (95-137)/(58-80) 116/68 (04/12 1500) SpO2:  [92 %-99 %] 93 % (04/12 1500) Last BM Date: 05/02/19 General:   Pleasant, cooperative in NAD Head:  Normocephalic and atraumatic. Eyes:   No icterus.   Conjunctiva pink. PERRLA. Ears:  Normal auditory acuity. Neck:  Supple; no masses or thyroidomegaly Lungs: Respirations even and unlabored. Lungs clear to auscultation bilaterally.   No wheezes, crackles, or rhonchi.  Heart: Tachycardia, regular rhythm;  Without murmur, clicks, rubs or gallops Abdomen:  Soft, nondistended, nontender, voluntary guarding. Normal bowel sounds. No appreciable masses or hepatomegaly.  No rebound or guarding.  Rectal:  Not performed. Msk:  Symmetrical without gross  deformities.  Strength normal Extremities:  Without edema, cyanosis or clubbing. Neurologic:  Alert and oriented x3;  grossly normal neurologically. Skin:  Intact without significant lesions or rashes. Psych:  Alert and cooperative. Normal affect.  LAB RESULTS: CBC Latest Ref Rng & Units 05/03/2019 05/01/2019 12/04/2018  WBC 4.0 - 10.5 K/uL 7.1 10.3 5.2  Hemoglobin 13.0 - 17.0 g/dL 12.5(L) 14.0 13.7  Hematocrit 39.0 - 52.0 % 34.4(L) 40.5 37.9(L)  Platelets 150 - 400 K/uL 232 239 232    BMET BMP Latest Ref Rng & Units 05/03/2019 05/02/2019 05/02/2019  Glucose 70 - 99 mg/dL 158(H) 199(H) 210(H)  BUN 8 - 23 mg/dL '14 13 14  ' Creatinine 0.61 - 1.24 mg/dL 0.69 0.63 0.59(L)  Sodium 135 - 145 mmol/L 132(L) 131(L) 133(L)  Potassium 3.5 - 5.1 mmol/L 3.4(L) 3.5 3.4(L)  Chloride 98 - 111 mmol/L 100 100 100  CO2 22 - 32 mmol/L '24 22 25    ' Calcium 8.9 - 10.3 mg/dL 8.1(L) 8.3(L) 8.2(L)    LFT Hepatic Function Latest Ref Rng & Units 05/03/2019 05/01/2019  Total Protein 6.5 - 8.1 g/dL 6.5 7.9  Albumin 3.5 - 5.0 g/dL 2.4(L) 3.2(L)  AST 15 - 41 U/L 32 24  ALT 0 - 44 U/L 14 14  Alk Phosphatase 38 - 126 U/L 55 73  Total Bilirubin 0.3 - 1.2 mg/dL 0.8 1.8(H)     STUDIES: MR ABDOMEN W WO CONTRAST  Result Date: 05/03/2019 CLINICAL DATA:  Evaluate hepatic mass seen on ultrasound. EXAM: MRI ABDOMEN WITHOUT AND WITH CONTRAST TECHNIQUE: Multiplanar multisequence MR imaging of the abdomen was performed both before and after the administration of intravenous contrast. CONTRAST:  81m GADAVIST GADOBUTROL 1 MMOL/ML IV SOLN COMPARISON:  Ultrasound evaluation of 05/03/2019 FINDINGS: Lower chest: Basilar interstitial and airspace disease better visualized on previous CT evaluation. No large pleural effusion. No pericardial fluid. With limited assessment of the lung bases. Subcarinal adenopathy also seen on the diffusion weighted sequence best seen on image 69 of series 6 measuring approximately 15 mm. Small left retrocrural lymph node approximately 1 cm. Hepatobiliary: 9 mm T2 bright lesion in the right lobe. This is within 1-2 mm of the abnormality seen in terms of the sagittal plane on the ultrasound evaluation. No additional abnormality is demonstrated. This corresponds with a benign hemangioma seen on CT scan. No pericholecystic stranding or biliary ductal dilation. No fat or iron in the liver. Pancreas: Pancreas with mild ductal dilation similar to previous CT evaluations. No focal pancreatic lesion. Spleen:  Normal size no focal suspicious lesion. Adrenals/Urinary Tract: Adrenal glands are normal. No suspicious renal lesion. Cysts in the bilateral kidneys largest on the left. Stomach/Bowel: Stomach, visualized small and large bowel are unremarkable. Study not protocol for bowel evaluation. Extensive gas in the colon limits assessment.  Vascular/Lymphatic:  Vascular structures in the abdomen are patent. Bulky retroperitoneal adenopathy worse along the left periaortic chain, largest measuring 2.2 cm short axis (image 64 of contrasted series. Slightly smaller lymph nodes are seen anterior to this. A retrocaval lymph node is also noted at 14 mm. (68, series 13) diffusion-weighted imaging also displays these lymph nodes to best. Subtle presumed inflammatory changes are noted about the left diaphragmatic crus tracking about celiac and SMA. Edema seen about the under surface of the left hemidiaphragm not seen on the right. This is seen only on the diffusion-weighted imaging without corresponding abnormality seen other sequences. Mild stranding previous CT is noted this area though there is a paucity of abdominal. Other:  No ascites. The Musculoskeletal: No inflammation about the spine. No destructive bone process. IMPRESSION: 1. Bulky retroperitoneal adenopathy worse along the left periaortic chain, suspicious for lymphoma or metastatic disease. Lymphoma is favored. Consider PET evaluation for further assessment as there may be sites that would be more amenable to biopsy. This would also encompass the chest which may be helpful. 2. Edema about the left diaphragmatic crus with thickening the left hemidiaphragm. Findings are of uncertain significance potentially related to lymphatic congestion or localized inflammation. Finding seen in hind site changed previous exams. Perhaps this relates to prior rib fractures in this location as well. These changes are more pronounced than on the initial evaluation of November 2020 where they were not seen, seen as a subtle abnormality on most recent comparison 3. Basilar interstitial and airspace disease better visualized on previous CT evaluation. 4. 9 mm benign hemangioma in the right lobe of the liver. Electronically Signed   By: Zetta Bills M.D.   On: 05/03/2019 13:05   CT Abdomen Pelvis W Contrast  Result  Date: 05/01/2019 CLINICAL DATA:  Abdominal pain with nausea and vomiting for 2 days. History of diabetes. EXAM: CT ABDOMEN AND PELVIS WITH CONTRAST TECHNIQUE: Multidetector CT imaging of the abdomen and pelvis was performed using the standard protocol following bolus administration of intravenous contrast. CONTRAST:  171m OMNIPAQUE IOHEXOL 300 MG/ML  SOLN COMPARISON:  Chest radiographs and abdominopelvic CT 12/04/2018. FINDINGS: Lower chest: There are extensive patchy ground-glass opacities at both lung bases which are new and suspicious for viral pneumonia. No consolidation, pleural or pericardial effusion. Hepatobiliary: The liver is normal in density without suspicious focal abnormality. No evidence of gallstones, gallbladder wall thickening or biliary dilatation. Pancreas: Mildly atrophied. There is mildly increased dilatation of the main pancreatic duct, but no evidence of pancreatic mass or surrounding inflammatory change. Spleen: Normal in size without focal abnormality. Adrenals/Urinary Tract: Both adrenal glands appear normal. Stable bilateral renal cysts. There are probable small nonobstructing bilateral renal calculi. The urinary bladder is markedly distended, and there is mild bilateral pelvicaliectasis. No evidence of ureteral calculus or focal perinephric fluid collection. Stomach/Bowel: No evidence of bowel wall thickening, distention or surrounding inflammatory change. The appendix is not clearly visualized. Vascular/Lymphatic: Multiple enlarged retroperitoneal lymph nodes are again noted, similar to the previous study. Representative nodes include a 2.1 cm short axis left periaortic node on image 37/2 and a 1.8 cm right periaortic node on image 39/2. Probable enlarged mesenteric lymph nodes, suboptimally evaluated due to the lack of enteric contrast and paucity of intra-abdominal fat. Mild aortic and branch vessel atherosclerosis. The portal, superior mesenteric and splenic veins are patent.  Reproductive: Mild enlargement of the prostate gland with central calcifications. Other: Paucity of intra-abdominal fat. No ascites, focal extraluminal fluid collection or free air. Musculoskeletal: No acute or significant osseous findings. Lower lumbar spondylosis and asymmetric right hip arthropathy. IMPRESSION: 1. Extensive patchy ground-glass opacities at both lung bases, new and suspicious for viral pneumonia. Consider chest radiographs and COVID-19 testing. 2. No significant change in multiple enlarged retroperitoneal and mesenteric lymph nodes. Based on the size of these nodes, a lymphoproliferative process remains a distinct possibility, although they could be reactive given their stability over 5 months. 3. Markedly distended urinary bladder with mild bilateral pelvicaliectasis suggesting chronic bladder outlet obstruction. No evidence of ureteral calculus or focal perinephric fluid collection. 4. Aortic Atherosclerosis (ICD10-I70.0). Electronically Signed   By: WRichardean SaleM.D.   On: 05/01/2019 17:45   DG Abd Portable 2V  Result Date: 05/03/2019 CLINICAL DATA:  Abdominal pain EXAM: PORTABLE ABDOMEN - 2 VIEW COMPARISON:  May 02, 2019 FINDINGS: There is moderate stool in the colon. There is no bowel dilatation or air-fluid level to suggest bowel obstruction. No free air. There is atelectatic change in the left lung base. IMPRESSION: No bowel obstruction or free air evident.  Moderate stool in colon. Electronically Signed   By: Lowella Grip III M.D.   On: 05/03/2019 09:03   DG Abd Portable 2V  Result Date: 05/02/2019 CLINICAL DATA:  64 year old male with abdominal pain. Positive COVID-19. EXAM: PORTABLE ABDOMEN - 2 VIEW COMPARISON:  CT abdomen pelvis dated 05/01/2019. FINDINGS: There is no bowel dilatation or evidence of obstruction. No free air. Punctate radiopaque foci over the left renal silhouette may correspond to the stone seen on the prior CT. There is degenerative changes of the  spine and hips. No acute osseous pathology. Bilateral confluent airspace opacities in keeping with known COVID-19. IMPRESSION: 1. No evidence of bowel obstruction. 2. Bilateral confluent airspace opacities in keeping with known COVID-19. Electronically Signed   By: Anner Crete M.D.   On: 05/02/2019 17:46   US Abdomen Limited RUQ  Result Date: 05/03/2019 CLINICAL DATA:  Pain. EXAM: ULTRASOUND ABDOMEN LIMITED RIGHT UPPER QUADRANT COMPARISON:  05/03/2019 abdominal radiographs. 05/01/2019 CT abdomen pelvis. FINDINGS: Gallbladder: No gallstones or wall thickening visualized. No sonographic Murphy sign noted by sonographer. Common bile duct: Diameter: 3.4 mm.  Within normal limits. Liver: 18.3 x 11.6 x 12.7 mm hyperechoic right hepatic mass which demonstrates internal vascularity on Doppler imaging. Diffusely increased hepatic parenchymal echogenicity. Portal vein is patent on color Doppler imaging with normal direction of blood flow towards the liver. Other: No ascites. IMPRESSION: 18.3 cm hyperechoic right hepatic mass. Differential includes a benign hemangioma. Please note that MRI is more sensitive in its evaluation of focal hepatic lesions. Hepatic steatosis. Electronically Signed   By: Primitivo Gauze M.D.   On: 05/03/2019 09:33      Impression / Plan:   [place]. is a 64 y.o. male with diabetes, hypertension who is admitted with left upper quadrant pain, nausea and nonbloody emesis with diarrhea, SARS COVID-19 pneumonia.  His GI symptoms are most likely secondary to COVID-19 infection, 10% of COVID-19 patients have gastroenteritis-like symptoms on initial presentation.  CT, ultrasound and MRI abdomen are unrevealing Left upper quadrant discomfort could also be secondary to referred pain from left lower lobe pneumonia Trial of pantoprazole 40 mg IV twice daily Treat constipation Advance diet as tolerated Recommend stool studies to rule out infection if diarrhea is persistent Defer  endoscopic evaluation at this time  Thank you for involving me in the care of this patient.      LOS: 2 days   Sherri Sear, MD  05/03/2019, 4:30 PM   Note: This dictation was prepared with Dragon dictation along with smaller phrase technology. Any transcriptional errors that result from this process are unintentional.

## 2019-05-03 NOTE — Progress Notes (Signed)
   05/03/19 0737  Assess: MEWS Score  Pulse Rate (!) 110  ECG Heart Rate (!) 112  Resp (!) 23  Level of Consciousness Alert  SpO2 94 %  O2 Device Room Air  Assess: MEWS Score  MEWS Temp 0  MEWS Systolic 0  MEWS Pulse 2  MEWS RR 1  MEWS LOC 0  MEWS Score 3  MEWS Score Color Yellow  Assess: if the MEWS score is Yellow or Red  Were vital signs taken at a resting state? Yes  Focused Assessment Documented focused assessment  Early Detection of Sepsis Score *See Row Information* Low  MEWS guidelines implemented *See Row Information* Yes  Treat  MEWS Interventions Other (Comment) (known clinical condition, MD notified. )  Take Vital Signs  Increase Vital Sign Frequency  Yellow: Q 2hr X 2 then Q 4hr X 2, if remains yellow, continue Q 4hrs  Escalate  MEWS: Escalate Yellow: discuss with charge nurse/RN and consider discussing with provider and RRT  Notify: Charge Nurse/RN  Name of Charge Nurse/RN Notified Caryl Pina, rn   Date Charge Nurse/RN Notified 05/03/19  Time Charge Nurse/RN Notified 0730  Notify: Provider  Provider Name/Title Dr. Manuella Ghazi   Date Provider Notified 05/03/19  Time Provider Notified 0730  Notification Type Page  Notification Reason Other (Comment) (Yellow MEWS score )  Response No new orders  Date of Provider Response 05/03/19  Time of Provider Response [phone]  Document  Patient Outcome Other (Comment) (no change)  Progress note created (see row info) Yes

## 2019-05-03 NOTE — Progress Notes (Addendum)
Alerted Dr. Manuella Ghazi that per night shift, patient's MEWS score is a 3 (yellow). Respiratory rate is 20-30 and HR is 110-120. Other vital signs are stable. Will continue frequent vital signs per MEWS score protocol. Patient is not in distress. Patient does continue to have pain in LUQ, although it is mild this AM. Patient has tried several times to get out of bed and drink water out of the sink. Potassium is 3.4. All of this relayed to Dr. Manuella Ghazi who placed orders. Will continue to monitor patient closely. All safety measures are in place.

## 2019-05-03 NOTE — Progress Notes (Signed)
NP B morrison notified of elevated HR 120's, RR, and soft BP and mews changes. Orders for lasix, ekg, and magnesium IV. Orders complete. Hr has trended down to 110's. Pt has c/o abdominal pain much of the shift. PRN morphine given and effective.

## 2019-05-03 NOTE — Progress Notes (Signed)
Alerted Dr. Manuella Ghazi that patient's BP is a little soft at 98/66 (74). Asymptomatic. MD acknowledged, no new orders. AT 1130 BP back up to 137/71. Will continue to monitor.

## 2019-05-03 NOTE — Progress Notes (Signed)
Alerted Dr. Manuella Ghazi that patient's HR is 120 and pain is resolved. MD acknowledged and has no new orders but to continue to monitor. Will continue to monitor.

## 2019-05-03 NOTE — Progress Notes (Signed)
Alerted Dr. Manuella Ghazi that patient's HR is 125 sustaining. MD stated to give oral pain medication and monitor patient. Will give medication and continue to monitor. Patient states he is in 6/10 abdominal pain.

## 2019-05-03 NOTE — Progress Notes (Signed)
CC: Abdominal pain Subjective: He still complains of intermittent abdominal pain.  He seems to think that is actually better as compared to yesterday.  No emesis.  He is tachycardic.  His white count is normal.  KUB is pending at this time.  Dr. Manuella Ghazi ordered a right upper quadrant ultrasound  Objective: Vital signs in last 24 hours: Temp:  [98 F (36.7 C)-99.5 F (37.5 C)] 98 F (36.7 C) (04/12 0513) Pulse Rate:  [97-124] 110 (04/12 0737) Resp:  [14-28] 23 (04/12 0737) BP: (95-133)/(57-85) 112/70 (04/12 0700) SpO2:  [93 %-99 %] 94 % (04/12 0737) Last BM Date: 05/02/19  Intake/Output from previous day: 04/11 0701 - 04/12 0700 In: 2723.7 [P.O.:240; I.V.:2408.7; IV Piggyback:75] Out: 1350 [id number] Intake/Output this shift: No intake/output data recorded.  Physical exam: NAD,  Abd: soft but diffusely tender, no peritonitis. Improved as compared to yesterday Ext: well perfused and no edema Lab Results: CBC  Recent Labs    05/01/19 1317 05/03/19 0208  WBC 10.3 7.1  HGB 14.0 12.5*  HCT 40.5 34.4*  PLT 239 232   BMET Recent Labs    05/02/19 2058 05/03/19 0208  NA 131* 132*  K 3.5 3.4*  CL 100 100  CO2 22 24  GLUCOSE 199* 158*  BUN 13 14  CREATININE 0.63 0.69  CALCIUM 8.3* 8.1*   PT/INR No results for input(s): LABPROT, INR in the last 72 hours. ABG Recent Labs    05/01/19 1741  HCO3 20.2    Studies/Results: CT Abdomen Pelvis W Contrast  Result Date: 05/01/2019 CLINICAL DATA:  Abdominal pain with nausea and vomiting for 2 days. History of diabetes. EXAM: CT ABDOMEN AND PELVIS WITH CONTRAST TECHNIQUE: Multidetector CT imaging of the abdomen and pelvis was performed using the standard protocol following bolus administration of intravenous contrast. CONTRAST:  166mL OMNIPAQUE IOHEXOL 300 MG/ML  SOLN COMPARISON:  Chest radiographs and abdominopelvic CT 12/04/2018. FINDINGS: Lower chest: There are extensive patchy ground-glass opacities at both lung bases which  are new and suspicious for viral pneumonia. No consolidation, pleural or pericardial effusion. Hepatobiliary: The liver is normal in density without suspicious focal abnormality. No evidence of gallstones, gallbladder wall thickening or biliary dilatation. Pancreas: Mildly atrophied. There is mildly increased dilatation of the main pancreatic duct, but no evidence of pancreatic mass or surrounding inflammatory change. Spleen: Normal in size without focal abnormality. Adrenals/Urinary Tract: Both adrenal glands appear normal. Stable bilateral renal cysts. There are probable small nonobstructing bilateral renal calculi. The urinary bladder is markedly distended, and there is mild bilateral pelvicaliectasis. No evidence of ureteral calculus or focal perinephric fluid collection. Stomach/Bowel: No evidence of bowel wall thickening, distention or surrounding inflammatory change. The appendix is not clearly visualized. Vascular/Lymphatic: Multiple enlarged retroperitoneal lymph nodes are again noted, similar to the previous study. Representative nodes include a 2.1 cm short axis left periaortic node on image 37/2 and a 1.8 cm right periaortic node on image 39/2. Probable enlarged mesenteric lymph nodes, suboptimally evaluated due to the lack of enteric contrast and paucity of intra-abdominal fat. Mild aortic and branch vessel atherosclerosis. The portal, superior mesenteric and splenic veins are patent. Reproductive: Mild enlargement of the prostate gland with central calcifications. Other: Paucity of intra-abdominal fat. No ascites, focal extraluminal fluid collection or free air. Musculoskeletal: No acute or significant osseous findings. Lower lumbar spondylosis and asymmetric right hip arthropathy. IMPRESSION: 1. Extensive patchy ground-glass opacities at both lung bases, new and suspicious for viral pneumonia. Consider chest radiographs and COVID-19 testing. 2. No  significant change in multiple enlarged  retroperitoneal and mesenteric lymph nodes. Based on the size of these nodes, a lymphoproliferative process remains a distinct possibility, although they could be reactive given their stability over 5 months. 3. Markedly distended urinary bladder with mild bilateral pelvicaliectasis suggesting chronic bladder outlet obstruction. No evidence of ureteral calculus or focal perinephric fluid collection. 4. Aortic Atherosclerosis (ICD10-I70.0). Electronically Signed   By: Richardean Sale M.D.   On: 05/01/2019 17:45   DG Abd Portable 2V  Result Date: 05/02/2019 CLINICAL DATA:  64 year old male with abdominal pain. Positive COVID-19. EXAM: PORTABLE ABDOMEN - 2 VIEW COMPARISON:  CT abdomen pelvis dated 05/01/2019. FINDINGS: There is no bowel dilatation or evidence of obstruction. No free air. Punctate radiopaque foci over the left renal silhouette may correspond to the stone seen on the prior CT. There is degenerative changes of the spine and hips. No acute osseous pathology. Bilateral confluent airspace opacities in keeping with known COVID-19. IMPRESSION: 1. No evidence of bowel obstruction. 2. Bilateral confluent airspace opacities in keeping with known COVID-19. Electronically Signed   By: Anner Crete M.D.   On: 05/02/2019 17:46    Anti-infectives: Anti-infectives (From admission, onward)   None      Assessment/Plan:  Abdominal pain of unclear etiology.  I still think that this is more related to his COVID-19 infection.  When compared to yesterday exam he actually feels better and looks more comfortable.  We will wait for abdominal x-ray and right upper quadrant ultrasound.  Even if the ultrasound shows some stone more than likely has been revealed some thickening of the gallbladder wall due to the interstitial edema and stress response caused by Covid and I am not sure if that will answer the source for his abdominal pain.  We will await GI recommendations.  Does not need surgical intervention at  this time We will be available in case any surgical issues arise I spent > 25 minutes in this encounter with greater than 50% spent in coordination counseling of his care  Caroleen Hamman, MD, [facility]  05/03/2019

## 2019-05-03 NOTE — Consult Note (Signed)
Patient is a 64 year old initially admitted for abdominal pain, but also noted to be Covid positive.  CT scan incidentally noted enlarged retroperitoneal lymphadenopathy, but upon further review of the chart these are chronic and unchanged for the least 5 to 6 months.  MRI of the abdomen confirmed the results.  These are highly suspicious for low-grade lymphoproliferative disorder given the chronicity of the lymphadenopathy.  Flow cytometry and tumor markers have been ordered for completeness.  Once patient's Covid infection resolves, he will require a PET scan and biopsy as an outpatient.

## 2019-05-04 ENCOUNTER — Inpatient Hospital Stay (HOSPITAL_COMMUNITY)
Admit: 2019-05-04 | Discharge: 2019-05-04 | Disposition: A | Discharge status: Home / Self Care | Payer: Medicare Other | Attending: Internal Medicine | Admitting: Internal Medicine

## 2019-05-04 DIAGNOSIS — I340 Nonrheumatic mitral (valve) insufficiency: Secondary | ICD-10-CM

## 2019-05-04 DIAGNOSIS — I361 Nonrheumatic tricuspid (valve) insufficiency: Secondary | ICD-10-CM

## 2019-05-04 LAB — COMPREHENSIVE METABOLIC PANEL
ALT: 14 U/L (ref 0–44)
AST: 37 U/L (ref 15–41)
Albumin: 2.3 g/dL — ABNORMAL LOW (ref 3.5–5.0)
Alkaline Phosphatase: 57 U/L (ref 38–126)
Anion gap: 9 (ref 5–15)
BUN: 15 mg/dL (ref 8–23)
CO2: 23 mmol/L (ref 22–32)
Calcium: 8.4 mg/dL — ABNORMAL LOW (ref 8.9–10.3)
Chloride: 104 mmol/L (ref 98–111)
Creatinine, Ser: 0.64 mg/dL (ref 0.61–1.24)
GFR calc Af Amer: >60 mL/min (ref >60)
GFR calc non Af Amer: >60 mL/min (ref >60)
Glucose, Bld: 164 mg/dL — ABNORMAL HIGH (ref 70–99)
Potassium: 4.1 mmol/L (ref 3.5–5.1)
Sodium: 136 mmol/L (ref 135–145)
Total Bilirubin: 1.5 mg/dL — ABNORMAL HIGH (ref 0.3–1.2)
Total Protein: 6.5 g/dL (ref 6.5–8.1)

## 2019-05-04 LAB — CBC
HCT: 33.5 % — ABNORMAL LOW (ref 39.0–52.0)
Hemoglobin: 11.7 g/dL — ABNORMAL LOW (ref 13.0–17.0)
MCH: 30.8 pg (ref 26.0–34.0)
MCHC: 34.9 g/dL (ref 30.0–36.0)
MCV: 88.2 fL (ref 80.0–100.0)
Platelets: 241 10*3/uL (ref 150–400)
RBC: 3.8 MIL/uL — ABNORMAL LOW (ref 4.22–5.81)
RDW: 12.3 % (ref 11.5–15.5)
WBC: 6.4 10*3/uL (ref 4.0–10.5)
nRBC: 0 % (ref 0.0–0.2)

## 2019-05-04 LAB — CEA: CEA: 8.8 ng/mL — ABNORMAL HIGH (ref 0.0–4.7)

## 2019-05-04 LAB — ECHOCARDIOGRAM COMPLETE
Height: 69 in
Weight: 2640 oz

## 2019-05-04 LAB — GLUCOSE, CAPILLARY
Glucose-Capillary: 158 mg/dL — ABNORMAL HIGH (ref 70–99)
Glucose-Capillary: 164 mg/dL — ABNORMAL HIGH (ref 70–99)
Glucose-Capillary: 163 mg/dL — ABNORMAL HIGH (ref 70–99)
Glucose-Capillary: 226 mg/dL — ABNORMAL HIGH (ref 70–99)

## 2019-05-04 LAB — CANCER ANTIGEN 19-9: CA 19-9: 95 U/mL — ABNORMAL HIGH (ref 0–35)

## 2019-05-04 LAB — AFP TUMOR MARKER: AFP, Serum, Tumor Marker: <0.9 ng/mL (ref 0.0–8.3)

## 2019-05-04 MED ORDER — METOPROLOL TARTRATE 25 MG PO TABS
12.5000 mg | ORAL_TABLET | Freq: Two times a day (BID) | ORAL | Status: DC
  Filled 2019-05-04: qty 1

## 2019-05-04 MED ORDER — SODIUM CHLORIDE 0.9 % IV SOLN: INTRAVENOUS | Status: DC

## 2019-05-04 MED ORDER — IBUPROFEN 400 MG PO TABS
400.0000 mg | ORAL_TABLET | Freq: Four times a day (QID) | ORAL | Status: DC
  Administered 2019-05-04 (×2): 400 mg via ORAL
  Filled 2019-05-04 (×3): qty 1

## 2019-05-04 NOTE — Progress Notes (Signed)
1        [place] [facility] [place] NAME: Victor Arnold    MR#:  [id number]  DATE OF BIRTH:  May 13, 1955  SUBJECTIVE:  CHIEF COMPLAINT:   Chief Complaint  Patient presents with  . Abdominal Pain  having 8-9/10 abd pain.  Would like to eat/drink.  Tachycardic REVIEW OF SYSTEMS:  Review of Systems  Constitutional: Negative for diaphoresis, fever, malaise/fatigue and weight loss.  HENT: Negative for ear discharge, ear pain, hearing loss, nosebleeds, sore throat and tinnitus.   Eyes: Negative for blurred vision and pain.  Respiratory: Negative for cough, hemoptysis, shortness of breath and wheezing.   Cardiovascular: Negative for chest pain, palpitations, orthopnea and leg swelling.  Gastrointestinal: Negative for blood in stool, constipation, diarrhea, heartburn, nausea and vomiting.  Genitourinary: Negative for dysuria, frequency and urgency.  Musculoskeletal: Positive for back pain. Negative for myalgias.  Skin: Negative for itching and rash.  Neurological: Negative for dizziness, tingling, tremors, focal weakness, seizures, weakness and headaches.  Psychiatric/Behavioral: Negative for depression. The patient is not nervous/anxious.    DRUG ALLERGIES:  No Known Allergies VITALS:  Blood pressure 105/65, pulse (!) 101, temperature 98.6 F (37 C), temperature source Oral, resp. rate 20, height 5\' 9"  (1.753 m), weight 74.8 kg, SpO2 96 %. PHYSICAL EXAMINATION:  Physical Exam HENT:     Head: Normocephalic and atraumatic.  Eyes:     Conjunctiva/sclera: Conjunctivae normal.     Pupils: Pupils are equal, round, and reactive to light.  Neck:     Thyroid: No thyromegaly.     Trachea: No tracheal deviation.  Cardiovascular:     Rate and Rhythm: Normal rate and regular rhythm.     Heart sounds: Normal heart sounds.  Pulmonary:     Effort: Pulmonary effort is normal. No respiratory distress.     Breath sounds: Normal breath sounds. No wheezing.  Chest:     Chest  wall: No tenderness.  Abdominal:     General: Bowel sounds are normal. There is no distension.     Palpations: Abdomen is soft.  Musculoskeletal:        General: Normal range of motion.     Cervical back: Normal range of motion and neck supple.  Skin:    General: Skin is warm and dry.     Findings: No rash.  Neurological:     Mental Status: He is alert and oriented to person, place, and time.     Cranial Nerves: No cranial nerve deficit.    LABORATORY PANEL:  Male CBC Recent Labs  Lab 05/04/19 0700  WBC 6.4  HGB 11.7*  HCT 33.5*  PLT 241   ------------------------------------------------------------------------------------------------------------------ Chemistries  Recent Labs  Lab 05/02/19 2058 05/03/19 0208 05/04/19 0700  NA 131*   < > 136  K 3.5   < > 4.1  CL 100   < > 104  CO2 22   < > 23  GLUCOSE 199*   < > 164*  BUN 13   < > 15  CREATININE 0.63   < > 0.64  CALCIUM 8.3*   < > 8.4*  MG 1.8  --   --   AST  --    < > 37  ALT  --    < > 14  ALKPHOS  --    < > 57  BILITOT  --    < > 1.5*   < > = values in this interval not displayed.   RADIOLOGY:  ECHOCARDIOGRAM COMPLETE  Result Date: 05/04/2019    ECHOCARDIOGRAM REPORT   Patient Name:   Victor Arnold. Date of Exam: 05/04/2019 Medical Rec #:  [id number]          Height:       69.0 in Accession #:    [id number]         Weight:       165.0 lb Date of Birth:  1955-04-23          BSA:          1.904 m Patient Age:    59 years           BP:           105/65 mmHg Patient Gender: M                  HR:           101 bpm. Exam Location:  [facility] Procedure: 2D Echo, Cardiac Doppler, Color Doppler and Strain Analysis Indications:     Dyspnea 786.09  History:         Patient has no prior history of Echocardiogram examinations.                  Risk Factors:Hypertension and Diabetes.  Sonographer:     Sherrie Sport RDCS (AE) Referring Phys:  [place] Diagnosing Phys: Harrell Gave End MD IMPRESSIONS  1. Left ventricular  ejection fraction, by estimation, is 50 to 55%. The left ventricle has low normal function. The left ventricle has no regional wall motion abnormalities. There is mild left ventricular hypertrophy. Left ventricular diastolic parameters are consistent with Grade I diastolic dysfunction (impaired relaxation).  2. Right ventricular systolic function is normal. The right ventricular size is mildly enlarged. There is normal pulmonary artery systolic pressure.  3. The mitral valve is degenerative. Mild mitral valve regurgitation. No evidence of mitral stenosis.  4. The aortic valve is tricuspid. Aortic valve regurgitation is not visualized. Mild aortic valve sclerosis is present, with no evidence of aortic valve stenosis. FINDINGS  Left Ventricle: Left ventricular ejection fraction, by estimation, is 50 to 55%. The left ventricle has low normal function. The left ventricle has no regional wall motion abnormalities. The left ventricular internal cavity size was normal in size. There is mild left ventricular hypertrophy. Left ventricular diastolic parameters are consistent with Grade I diastolic dysfunction (impaired relaxation). Right Ventricle: The right ventricular size is mildly enlarged. No increase in right ventricular wall thickness. Right ventricular systolic function is normal. There is normal pulmonary artery systolic pressure. Left Atrium: Left atrial size was normal in size. Right Atrium: Right atrial size was normal in size. Pericardium: The pericardium was not well visualized. Mitral Valve: The mitral valve is degenerative in appearance. There is mild thickening of the mitral valve leaflet(s). Mild mitral valve regurgitation. No evidence of mitral valve stenosis. Tricuspid Valve: The tricuspid valve is not well visualized. Tricuspid valve regurgitation is mild. Aortic Valve: The aortic valve is tricuspid. . There is moderate thickening and mild calcification of the aortic valve. Aortic valve regurgitation is  not visualized. Mild aortic valve sclerosis is present, with no evidence of aortic valve stenosis. There  is moderate thickening of the aortic valve. There is mild calcification of the aortic valve. Aortic valve mean gradient measures 3.0 mmHg. Aortic valve peak gradient measures 4.5 mmHg. Aortic valve area, by VTI measures 3.01 cm. Pulmonic Valve: The pulmonic valve was not well visualized. Pulmonic valve regurgitation is  not visualized. No evidence of pulmonic stenosis. Aorta: The aortic root is normal in size and structure. Pulmonary Artery: The pulmonary artery is not well seen. Venous: The inferior vena cava was not well visualized. IAS/Shunts: The interatrial septum was not well visualized.  LEFT VENTRICLE PLAX 2D LVIDd:         3.52 cm  Diastology LVIDs:         2.56 cm  LV e' lateral:   5.11 cm/s LV PW:         1.17 cm  LV E/e' lateral: 9.1 LV IVS:        1.06 cm  LV e' medial:    5.00 cm/s LVOT diam:     2.10 cm  LV E/e' medial:  9.3 LV SV:         50 LV SV Index:   26       2D Longitudinal Strain LVOT Area:     3.46 cm 2D Strain GLS Avg:     -10.0 %                          3D Volume EF:                         3D EF:        54 %                         LV EDV:       155 ml                         LV ESV:       70 ml                         LV SV:        84 ml RIGHT VENTRICLE RV Basal diam:  4.30 cm LEFT ATRIUM         Index      RIGHT ATRIUM           Index LA diam:    3.10 cm 1.63 cm/m RA Area:     15.74 cm 8.27 cm/m  AORTIC VALVE                   PULMONIC VALVE AV Area (Vmax):    2.84 cm    RVOT Peak grad: 2 mmHg AV Area (Vmean):   2.62 cm AV Area (VTI):     3.01 cm AV Vmax:           106.00 cm/s AV Vmean:          80.850 cm/s AV VTI:            0.166 m AV Peak Grad:      4.5 mmHg AV Mean Grad:      3.0 mmHg LVOT Vmax:         86.80 cm/s LVOT Vmean:        61.200 cm/s LVOT VTI:          0.144 m LVOT/AV VTI ratio: 0.87  AORTA Ao Root diam: 3.10 cm MITRAL VALVE               TRICUSPID VALVE MV  Area (PHT): 4.77 cm    TR Peak grad:   18.3 mmHg MV Decel Time: 159 msec  TR Vmax:        214.00 cm/s MV E velocity: 46.70 cm/s MV A velocity: 68.10 cm/s  SHUNTS MV E/A ratio:  0.69        Systemic VTI:  0.14 m                            Systemic Diam: 2.10 cm Nelva Bush MD Electronically signed by Nelva Bush MD Signature Date/Time: 05/04/2019/2:15:12 PM    Final    ASSESSMENT AND PLAN:  Victor Arnold. is a 64 y.o. male with medical history significant for type 2 diabetes, hyperlipidemia and GERD who presents with concerns of abdominal pain  Type 2 diabetes/early DKA DKA resolved A1C >15.5% suggestive of very poor diabetes control -We will resume his home medications at discharge.  Sliding scale dose for now.  Continue insulin Lantus  Acute on chronic abdominal pain/back pain No clear etiology.  CT not showing any acute pathology - amylase and lipase within normal limit.  Appreciate surgical consultation.  Requesting GI consultation -CT is showing multiple enlarged retroperitoneal and mesenteric lymph node concerning for lymphoproliferative process - Oncology input appreciated.  Recommends outpatient PET scan and further evaluation at cancer center -Right upper quadrant ultrasound showed 18.3 cm right hepatic mass - Abdominal x-ray within normal limits -As needed pain medication for symptom management for now will discontinue morphine and start him on ibuprofen 400 mg 4 times daily per day to see if it helps his back pain as it seems muscular -Advance diet as tolerated  COVID infection Not hypoxic.  Continue pulse oximetry monitoring Stopping Decadron as he is on room air Follow inflammatory labs  Chronic bladder outlet obstruction Unclear etiology. No acute symptoms at this time. Need follow-up outpatient.  Hypokalemia Replete and recheck  Hypertension Resume antihypertensive when able to tolerate PO  Hyperlipidemia Resume statin when able to tolerate  PO   Status is: Inpatient  Remains inpatient appropriate because:Ongoing active pain requiring inpatient pain management   Dispo: The patient is from: Home              Anticipated d/c is to: Home              Anticipated d/c date is: 1 day              Patient currently is not medically stable to d/c.    DVT prophylaxis: Lovenox Family Communication: discussed with patient.  I tried to call his sister - no one picked up   All the records are reviewed and case discussed with Care Management/Social Worker. Management plans discussed with the patient, nursing and they are in agreement.  CODE STATUS: Full Code  TOTAL TIME TAKING CARE OF THIS PATIENT: 35 minutes.   More than 50% of the time was spent in counseling/coordination of care: YES  POSSIBLE D/C IN 1-2 DAYS, DEPENDING ON CLINICAL CONDITION.   Max Sane M.D on 05/04/2019 at 2:45 PM  [facility]   CC: Primary care physician; Cletis Athens, MD  Note: This dictation was prepared with Dragon dictation along with smaller phrase technology. Any transcriptional errors that result from this process are unintentional.

## 2019-05-04 NOTE — Progress Notes (Signed)
MD ordered metoprolol for patient his AM with order to hold for SBP below 110. Alerted Dr. Manuella Ghazi that I am holding metoprolol as patient's BP is 103/67 with HR 105. MD acknowledged this notification. No new orders.

## 2019-05-04 NOTE — Progress Notes (Signed)
*  PRELIMINARY RESULTS* Echocardiogram 2D Echocardiogram has been performed.  Victor Arnold 05/04/2019, 12:41 PM
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
## 2019-05-04 NOTE — Progress Notes (Addendum)
Alerted Dr. Manuella Ghazi that patient's BP is 83/54 (62). Asymptomatic. CCMD also alerted md that patient had a 1 minute run of SVT into the 150s. Dr. Manuella Ghazi alerted who acknoweged. Breathing is even and unlabored. MD has no new orders, will continue to monitor.   BP came back up to 100/62. Will continue to monitor.

## 2019-05-04 NOTE — Progress Notes (Signed)
MEWS score is elevated in the yellow at a 3. This is a known clinical condition due to elevated HR and RR. Patient is not in distress and this is not an acute change. Dr. Manuella Ghazi is aware and Charge nurse is aware. Will continue to monitor patient closely.
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
## 2019-05-06 ENCOUNTER — Inpatient Hospital Stay: Payer: Self-pay

## 2019-05-06 ENCOUNTER — Other Ambulatory Visit: Payer: Self-pay

## 2019-05-06 ENCOUNTER — Emergency Department: Payer: Medicare Other

## 2019-05-06 ENCOUNTER — Inpatient Hospital Stay
Admission: EM | Admit: 2019-05-06 | Discharge: 2019-05-22 | DRG: 871 | Disposition: E | Payer: Medicare Other | Attending: Internal Medicine | Admitting: Internal Medicine

## 2019-05-06 DIAGNOSIS — I110 Hypertensive heart disease with heart failure: Secondary | ICD-10-CM | POA: Diagnosis present

## 2019-05-06 DIAGNOSIS — R600 Localized edema: Secondary | ICD-10-CM

## 2019-05-06 DIAGNOSIS — A4102 Sepsis due to Methicillin resistant Staphylococcus aureus: Secondary | ICD-10-CM | POA: Diagnosis present

## 2019-05-06 DIAGNOSIS — J15212 Pneumonia due to Methicillin resistant Staphylococcus aureus: Secondary | ICD-10-CM | POA: Diagnosis present

## 2019-05-06 DIAGNOSIS — D479 Neoplasm of uncertain behavior of lymphoid, hematopoietic and related tissue, unspecified: Secondary | ICD-10-CM | POA: Diagnosis present

## 2019-05-06 DIAGNOSIS — L89322 Pressure ulcer of left buttock, stage 2: Secondary | ICD-10-CM

## 2019-05-06 DIAGNOSIS — K219 Gastro-esophageal reflux disease without esophagitis: Secondary | ICD-10-CM | POA: Diagnosis present

## 2019-05-06 DIAGNOSIS — L8990 Pressure ulcer of unspecified site, unspecified stage: Secondary | ICD-10-CM | POA: Insufficient documentation

## 2019-05-06 DIAGNOSIS — Z452 Encounter for adjustment and management of vascular access device: Secondary | ICD-10-CM

## 2019-05-06 DIAGNOSIS — R6521 Severe sepsis with septic shock: Secondary | ICD-10-CM | POA: Diagnosis present

## 2019-05-06 DIAGNOSIS — D696 Thrombocytopenia, unspecified: Secondary | ICD-10-CM | POA: Diagnosis not present

## 2019-05-06 DIAGNOSIS — I248 Other forms of acute ischemic heart disease: Secondary | ICD-10-CM | POA: Diagnosis present

## 2019-05-06 DIAGNOSIS — R4182 Altered mental status, unspecified: Secondary | ICD-10-CM | POA: Diagnosis present

## 2019-05-06 DIAGNOSIS — Z6822 Body mass index (BMI) 22.0-22.9, adult: Secondary | ICD-10-CM

## 2019-05-06 DIAGNOSIS — A4189 Other specified sepsis: Principal | ICD-10-CM | POA: Diagnosis present

## 2019-05-06 DIAGNOSIS — J9600 Acute respiratory failure, unspecified whether with hypoxia or hypercapnia: Secondary | ICD-10-CM

## 2019-05-06 DIAGNOSIS — R9389 Abnormal findings on diagnostic imaging of other specified body structures: Secondary | ICD-10-CM

## 2019-05-06 DIAGNOSIS — N401 Enlarged prostate with lower urinary tract symptoms: Secondary | ICD-10-CM | POA: Diagnosis present

## 2019-05-06 DIAGNOSIS — J1282 Pneumonia due to coronavirus disease 2019: Secondary | ICD-10-CM | POA: Diagnosis present

## 2019-05-06 DIAGNOSIS — Z66 Do not resuscitate: Secondary | ICD-10-CM | POA: Diagnosis not present

## 2019-05-06 DIAGNOSIS — Z515 Encounter for palliative care: Secondary | ICD-10-CM | POA: Diagnosis not present

## 2019-05-06 DIAGNOSIS — E872 Acidosis: Secondary | ICD-10-CM | POA: Diagnosis present

## 2019-05-06 DIAGNOSIS — Z978 Presence of other specified devices: Secondary | ICD-10-CM

## 2019-05-06 DIAGNOSIS — I5021 Acute systolic (congestive) heart failure: Secondary | ICD-10-CM | POA: Diagnosis present

## 2019-05-06 DIAGNOSIS — F1110 Opioid abuse, uncomplicated: Secondary | ICD-10-CM | POA: Diagnosis present

## 2019-05-06 DIAGNOSIS — E43 Unspecified severe protein-calorie malnutrition: Secondary | ICD-10-CM | POA: Diagnosis present

## 2019-05-06 DIAGNOSIS — F14988 Cocaine use, unspecified with other cocaine-induced disorder: Secondary | ICD-10-CM | POA: Diagnosis present

## 2019-05-06 DIAGNOSIS — N179 Acute kidney failure, unspecified: Secondary | ICD-10-CM | POA: Diagnosis present

## 2019-05-06 DIAGNOSIS — R338 Other retention of urine: Secondary | ICD-10-CM | POA: Diagnosis present

## 2019-05-06 DIAGNOSIS — E1165 Type 2 diabetes mellitus with hyperglycemia: Secondary | ICD-10-CM | POA: Diagnosis present

## 2019-05-06 DIAGNOSIS — E785 Hyperlipidemia, unspecified: Secondary | ICD-10-CM | POA: Diagnosis present

## 2019-05-06 DIAGNOSIS — U071 COVID-19: Secondary | ICD-10-CM | POA: Diagnosis present

## 2019-05-06 DIAGNOSIS — A419 Sepsis, unspecified organism: Secondary | ICD-10-CM

## 2019-05-06 DIAGNOSIS — Z4659 Encounter for fitting and adjustment of other gastrointestinal appliance and device: Secondary | ICD-10-CM

## 2019-05-06 DIAGNOSIS — G9341 Metabolic encephalopathy: Secondary | ICD-10-CM | POA: Diagnosis present

## 2019-05-06 DIAGNOSIS — E8809 Other disorders of plasma-protein metabolism, not elsewhere classified: Secondary | ICD-10-CM | POA: Diagnosis present

## 2019-05-06 DIAGNOSIS — J80 Acute respiratory distress syndrome: Secondary | ICD-10-CM | POA: Diagnosis present

## 2019-05-06 DIAGNOSIS — J9601 Acute respiratory failure with hypoxia: Secondary | ICD-10-CM

## 2019-05-06 DIAGNOSIS — D649 Anemia, unspecified: Secondary | ICD-10-CM | POA: Diagnosis present

## 2019-05-06 LAB — COMPREHENSIVE METABOLIC PANEL
ALT: 24 U/L (ref 0–44)
ALT: 26 U/L (ref 0–44)
AST: 67 U/L — ABNORMAL HIGH (ref 15–41)
AST: 73 U/L — ABNORMAL HIGH (ref 15–41)
Albumin: 1.7 g/dL — ABNORMAL LOW (ref 3.5–5.0)
Albumin: 1.5 g/dL — ABNORMAL LOW (ref 3.5–5.0)
Alkaline Phosphatase: 67 U/L (ref 38–126)
Alkaline Phosphatase: 60 U/L (ref 38–126)
Anion gap: 15 (ref 5–15)
Anion gap: 12 (ref 5–15)
BUN: 41 mg/dL — ABNORMAL HIGH (ref 8–23)
BUN: 42 mg/dL — ABNORMAL HIGH (ref 8–23)
CO2: 15 mmol/L — ABNORMAL LOW (ref 22–32)
CO2: 19 mmol/L — ABNORMAL LOW (ref 22–32)
Calcium: 8.3 mg/dL — ABNORMAL LOW (ref 8.9–10.3)
Calcium: 7.7 mg/dL — ABNORMAL LOW (ref 8.9–10.3)
Chloride: 98 mmol/L (ref 98–111)
Chloride: 102 mmol/L (ref 98–111)
Creatinine, Ser: 1.67 mg/dL — ABNORMAL HIGH (ref 0.61–1.24)
Creatinine, Ser: 1.49 mg/dL — ABNORMAL HIGH (ref 0.61–1.24)
GFR calc Af Amer: 49 mL/min — ABNORMAL LOW (ref >60)
GFR calc Af Amer: 56 mL/min — ABNORMAL LOW (ref >60)
GFR calc non Af Amer: 42 mL/min — ABNORMAL LOW (ref >60)
GFR calc non Af Amer: 49 mL/min — ABNORMAL LOW (ref >60)
Glucose, Bld: 364 mg/dL — ABNORMAL HIGH (ref 70–99)
Glucose, Bld: 230 mg/dL — ABNORMAL HIGH (ref 70–99)
Potassium: 4.5 mmol/L (ref 3.5–5.1)
Potassium: 4.5 mmol/L (ref 3.5–5.1)
Sodium: 128 mmol/L — ABNORMAL LOW (ref 135–145)
Sodium: 133 mmol/L — ABNORMAL LOW (ref 135–145)
Total Bilirubin: 2 mg/dL — ABNORMAL HIGH (ref 0.3–1.2)
Total Bilirubin: 1.5 mg/dL — ABNORMAL HIGH (ref 0.3–1.2)
Total Protein: 5.7 g/dL — ABNORMAL LOW (ref 6.5–8.1)
Total Protein: 5.5 g/dL — ABNORMAL LOW (ref 6.5–8.1)

## 2019-05-06 LAB — COMP PANEL: LEUKEMIA/LYMPHOMA

## 2019-05-06 LAB — BLOOD CULTURE ID PANEL (REFLEXED)

## 2019-05-06 LAB — PHOSPHORUS: Phosphorus: 2.3 mg/dL — ABNORMAL LOW (ref 2.5–4.6)

## 2019-05-06 LAB — CBC WITH DIFFERENTIAL/PLATELET
Abs Immature Granulocytes: 0.09 10*3/uL — ABNORMAL HIGH (ref 0.00–0.07)
Basophils Absolute: 0.1 10*3/uL (ref 0.0–0.1)
Basophils Relative: 1 %
Eosinophils Absolute: 0 10*3/uL (ref 0.0–0.5)
Eosinophils Relative: 0 %
HCT: 30.6 % — ABNORMAL LOW (ref 39.0–52.0)
Hemoglobin: 10.5 g/dL — ABNORMAL LOW (ref 13.0–17.0)
Immature Granulocytes: 1 %
Lymphocytes Relative: 5 %
Lymphs Abs: 0.3 10*3/uL — ABNORMAL LOW (ref 0.7–4.0)
MCH: 30.5 pg (ref 26.0–34.0)
MCHC: 34.3 g/dL (ref 30.0–36.0)
MCV: 89 fL (ref 80.0–100.0)
Monocytes Absolute: 0.1 10*3/uL (ref 0.1–1.0)
Monocytes Relative: 2 %
Neutro Abs: 5.6 10*3/uL (ref 1.7–7.7)
Neutrophils Relative %: 91 %
Platelets: 220 10*3/uL (ref 150–400)
RBC: 3.44 MIL/uL — ABNORMAL LOW (ref 4.22–5.81)
RDW: 12.5 % (ref 11.5–15.5)
Smear Review: NORMAL
WBC: 6.2 10*3/uL (ref 4.0–10.5)
nRBC: 0 % (ref 0.0–0.2)

## 2019-05-06 LAB — HIV ANTIBODY (ROUTINE TESTING W REFLEX): HIV Screen 4th Generation wRfx: NONREACTIVE

## 2019-05-06 LAB — URINALYSIS, ROUTINE W REFLEX MICROSCOPIC
Bacteria, UA: NONE SEEN
Glucose, UA: NEGATIVE mg/dL
Ketones, ur: NEGATIVE mg/dL
Leukocytes,Ua: NEGATIVE
Nitrite: NEGATIVE
Protein, ur: 100 mg/dL — AB
Specific Gravity, Urine: 1.018 (ref 1.005–1.030)
Squamous Epithelial / HPF: NONE SEEN (ref 0–5)
pH: 7 (ref 5.0–8.0)

## 2019-05-06 LAB — GLUCOSE, CAPILLARY
Glucose-Capillary: 314 mg/dL — ABNORMAL HIGH (ref 70–99)
Glucose-Capillary: 216 mg/dL — ABNORMAL HIGH (ref 70–99)
Glucose-Capillary: 180 mg/dL — ABNORMAL HIGH (ref 70–99)
Glucose-Capillary: 158 mg/dL — ABNORMAL HIGH (ref 70–99)

## 2019-05-06 LAB — MAGNESIUM: Magnesium: 2.1 mg/dL (ref 1.7–2.4)

## 2019-05-06 LAB — URINE DRUG SCREEN, QUALITATIVE (ARMC ONLY)
Amphetamines, Ur Screen: NOT DETECTED
Barbiturates, Ur Screen: NOT DETECTED
Benzodiazepine, Ur Scrn: NOT DETECTED
Cannabinoid 50 Ng, Ur ~~LOC~~: NOT DETECTED
Cocaine Metabolite,Ur ~~LOC~~: POSITIVE — AB
MDMA (Ecstasy)Ur Screen: NOT DETECTED
Methadone Scn, Ur: NOT DETECTED
Opiate, Ur Screen: POSITIVE — AB
Phencyclidine (PCP) Ur S: NOT DETECTED
Tricyclic, Ur Screen: NOT DETECTED

## 2019-05-06 LAB — LACTIC ACID, PLASMA
Lactic Acid, Venous: 4.7 mmol/L (ref 0.5–1.9)
Lactic Acid, Venous: 3.2 mmol/L (ref 0.5–1.9)
Lactic Acid, Venous: 4.3 mmol/L (ref 0.5–1.9)
Lactic Acid, Venous: 3.4 mmol/L (ref 0.5–1.9)

## 2019-05-06 LAB — BLOOD GAS, VENOUS
Acid-base deficit: 8.1 mmol/L — ABNORMAL HIGH (ref 0.0–2.0)
Bicarbonate: 14.3 mmol/L — ABNORMAL LOW (ref 20.0–28.0)
O2 Saturation: 90.7 %
Patient temperature: 37
pCO2, Ven: 22 mmHg — ABNORMAL LOW (ref 44.0–60.0)
pH, Ven: 7.42 (ref 7.250–7.430)
pO2, Ven: 59 mmHg — ABNORMAL HIGH (ref 32.0–45.0)

## 2019-05-06 LAB — APTT: aPTT: 39 seconds — ABNORMAL HIGH (ref 24–36)

## 2019-05-06 LAB — ABO/RH: ABO/RH(D): O POS

## 2019-05-06 LAB — TROPONIN I (HIGH SENSITIVITY)
Troponin I (High Sensitivity): 24 ng/L — ABNORMAL HIGH (ref <18)
Troponin I (High Sensitivity): 22 ng/L — ABNORMAL HIGH (ref <18)

## 2019-05-06 LAB — FIBRIN DERIVATIVES D-DIMER (ARMC ONLY): Fibrin derivatives D-dimer (ARMC): 5611.15 ng/mL (FEU) — ABNORMAL HIGH (ref 0.00–499.00)

## 2019-05-06 LAB — C-REACTIVE PROTEIN: CRP: 24.9 mg/dL — ABNORMAL HIGH (ref <1.0)

## 2019-05-06 LAB — LACTATE DEHYDROGENASE: LDH: 262 U/L — ABNORMAL HIGH (ref 98–192)

## 2019-05-06 LAB — PROTIME-INR
INR: 1.4 — ABNORMAL HIGH (ref 0.8–1.2)
Prothrombin Time: 17.5 seconds — ABNORMAL HIGH (ref 11.4–15.2)

## 2019-05-06 LAB — MRSA PCR SCREENING: MRSA by PCR: POSITIVE — AB

## 2019-05-06 LAB — PROCALCITONIN: Procalcitonin: 27.64 ng/mL

## 2019-05-06 LAB — CK: Total CK: 1461 U/L — ABNORMAL HIGH (ref 49–397)

## 2019-05-06 LAB — BRAIN NATRIURETIC PEPTIDE: B Natriuretic Peptide: 154 pg/mL — ABNORMAL HIGH (ref 0.0–100.0)

## 2019-05-06 LAB — FERRITIN: Ferritin: 1270 ng/mL — ABNORMAL HIGH (ref 24–336)

## 2019-05-06 MED ORDER — DEXAMETHASONE SODIUM PHOSPHATE 10 MG/ML IJ SOLN
10.0000 mg | Freq: Once | INTRAMUSCULAR | Status: AC
  Administered 2019-05-06: 10 mg via INTRAVENOUS
  Filled 2019-05-06: qty 1

## 2019-05-06 MED ORDER — INSULIN GLARGINE 100 UNIT/ML ~~LOC~~ SOLN
10.0000 [IU] | Freq: Every day | SUBCUTANEOUS | Status: DC
  Administered 2019-05-06 – 2019-05-12 (×7): 10 [IU] via SUBCUTANEOUS
  Filled 2019-05-06 (×7): qty 0.1

## 2019-05-06 MED ORDER — SODIUM CHLORIDE 0.9% FLUSH
10.0000 mL | Freq: Two times a day (BID) | INTRAVENOUS | Status: DC
  Administered 2019-05-06 (×2): 10 mL
  Administered 2019-05-07: 30 mL
  Administered 2019-05-08 – 2019-05-12 (×6): 10 mL

## 2019-05-06 MED ORDER — SODIUM BICARBONATE 8.4 % IV SOLN
INTRAVENOUS | Status: AC
  Filled 2019-05-06: qty 100

## 2019-05-06 MED ORDER — DEXAMETHASONE SODIUM PHOSPHATE 10 MG/ML IJ SOLN: 6.0000 mg | INTRAMUSCULAR | Status: DC

## 2019-05-06 MED ORDER — SODIUM CHLORIDE 0.9 % IV SOLN
200.0000 mg | Freq: Once | INTRAVENOUS | Status: AC
  Administered 2019-05-06: 200 mg via INTRAVENOUS
  Filled 2019-05-06: qty 40

## 2019-05-06 MED ORDER — FAMOTIDINE IN NACL 20-0.9 MG/50ML-% IV SOLN
20.0000 mg | Freq: Two times a day (BID) | INTRAVENOUS | Status: DC
  Administered 2019-05-06 – 2019-05-14 (×16): 20 mg via INTRAVENOUS
  Filled 2019-05-06 (×16): qty 50

## 2019-05-06 MED ORDER — CHLORHEXIDINE GLUCONATE CLOTH 2 % EX PADS
6.0000 | MEDICATED_PAD | Freq: Every day | CUTANEOUS | Status: DC
  Administered 2019-05-06 – 2019-05-19 (×12): 6 via TOPICAL
  Filled 2019-05-06: qty 6

## 2019-05-06 MED ORDER — SODIUM CHLORIDE 0.9% FLUSH: 10.0000 mL | INTRAVENOUS | Status: DC | PRN

## 2019-05-06 MED ORDER — SODIUM CHLORIDE 0.9 % IV SOLN
500.0000 mg | INTRAVENOUS | Status: DC
  Administered 2019-05-06 – 2019-05-07 (×2): 500 mg via INTRAVENOUS
  Filled 2019-05-06 (×2): qty 500

## 2019-05-06 MED ORDER — ACETAMINOPHEN 325 MG PO TABS
650.0000 mg | ORAL_TABLET | Freq: Four times a day (QID) | ORAL | Status: DC | PRN
  Administered 2019-05-07: 650 mg via ORAL
  Filled 2019-05-06: qty 2

## 2019-05-06 MED ORDER — VANCOMYCIN HCL 1500 MG/300ML IV SOLN
1500.0000 mg | Freq: Once | INTRAVENOUS | Status: AC
  Administered 2019-05-06: 1500 mg via INTRAVENOUS
  Filled 2019-05-06: qty 300

## 2019-05-06 MED ORDER — ENOXAPARIN SODIUM 40 MG/0.4ML ~~LOC~~ SOLN
40.0000 mg | Freq: Two times a day (BID) | SUBCUTANEOUS | Status: DC
  Administered 2019-05-06 – 2019-05-08 (×6): 40 mg via SUBCUTANEOUS
  Filled 2019-05-06 (×6): qty 0.4

## 2019-05-06 MED ORDER — HYDROCORTISONE NA SUCCINATE PF 100 MG IJ SOLR
50.0000 mg | Freq: Four times a day (QID) | INTRAMUSCULAR | Status: DC
  Administered 2019-05-06 – 2019-05-09 (×13): 50 mg via INTRAVENOUS
  Filled 2019-05-06 (×13): qty 2

## 2019-05-06 MED ORDER — HYDROCOD POLST-CPM POLST ER 10-8 MG/5ML PO SUER
5.0000 mL | Freq: Two times a day (BID) | ORAL | Status: DC | PRN
  Administered 2019-05-09: 5 mL via ORAL
  Filled 2019-05-06: qty 5

## 2019-05-06 MED ORDER — NOREPINEPHRINE 4 MG/250ML-% IV SOLN
2.0000 ug/min | INTRAVENOUS | Status: DC
  Administered 2019-05-06: 2 ug/min via INTRAVENOUS
  Filled 2019-05-06: qty 250

## 2019-05-06 MED ORDER — HEPARIN (PORCINE) 25000 UT/250ML-% IV SOLN
1000.0000 [IU]/h | INTRAVENOUS | Status: DC
  Administered 2019-05-06: 1000 [IU]/h via INTRAVENOUS
  Filled 2019-05-06: qty 250

## 2019-05-06 MED ORDER — STERILE WATER FOR INJECTION IV SOLN
INTRAVENOUS | Status: DC
  Filled 2019-05-06 (×5): qty 850

## 2019-05-06 MED ORDER — INSULIN ASPART 100 UNIT/ML ~~LOC~~ SOLN: 0.0000 [IU] | SUBCUTANEOUS | Status: DC

## 2019-05-06 MED ORDER — GUAIFENESIN-DM 100-10 MG/5ML PO SYRP
10.0000 mL | ORAL_SOLUTION | ORAL | Status: DC | PRN
  Administered 2019-05-11: 10 mL via ORAL
  Filled 2019-05-06 (×2): qty 10

## 2019-05-06 MED ORDER — SODIUM CHLORIDE 0.9 % IV BOLUS (SEPSIS)
500.0000 mL | Freq: Once | INTRAVENOUS | Status: AC
  Administered 2019-05-06: 500 mL via INTRAVENOUS

## 2019-05-06 MED ORDER — INSULIN ASPART 100 UNIT/ML ~~LOC~~ SOLN
0.0000 [IU] | SUBCUTANEOUS | Status: DC
  Administered 2019-05-06: 3 [IU] via SUBCUTANEOUS
  Administered 2019-05-06: 11 [IU] via SUBCUTANEOUS
  Administered 2019-05-07: 15 [IU] via SUBCUTANEOUS
  Administered 2019-05-07: 5 [IU] via SUBCUTANEOUS
  Administered 2019-05-07: 8 [IU] via SUBCUTANEOUS
  Administered 2019-05-07: 2 [IU] via SUBCUTANEOUS
  Administered 2019-05-08: 5 [IU] via SUBCUTANEOUS
  Administered 2019-05-08: 3 [IU] via SUBCUTANEOUS
  Administered 2019-05-08: 2 [IU] via SUBCUTANEOUS
  Administered 2019-05-08: 8 [IU] via SUBCUTANEOUS
  Administered 2019-05-08: 3 [IU] via SUBCUTANEOUS
  Administered 2019-05-09 (×2): 11 [IU] via SUBCUTANEOUS
  Administered 2019-05-09 – 2019-05-10 (×2): 5 [IU] via SUBCUTANEOUS
  Administered 2019-05-10: 2 [IU] via SUBCUTANEOUS
  Administered 2019-05-10: 5 [IU] via SUBCUTANEOUS
  Filled 2019-05-06 (×16): qty 1

## 2019-05-06 MED ORDER — MIDODRINE HCL 5 MG PO TABS
10.0000 mg | ORAL_TABLET | Freq: Three times a day (TID) | ORAL | Status: DC
  Administered 2019-05-07 – 2019-05-18 (×28): 10 mg via ORAL
  Filled 2019-05-06 (×31): qty 2

## 2019-05-06 MED ORDER — ZINC SULFATE 220 (50 ZN) MG PO CAPS
220.0000 mg | ORAL_CAPSULE | Freq: Every day | ORAL | Status: DC
  Administered 2019-05-08 – 2019-05-14 (×7): 220 mg via ORAL
  Filled 2019-05-06 (×5): qty 1

## 2019-05-06 MED ORDER — HYDROCORTISONE NICU INJ SYRINGE 50 MG/ML: 50.0000 mg | Freq: Four times a day (QID) | INTRAVENOUS | Status: DC

## 2019-05-06 MED ORDER — SODIUM CHLORIDE 0.9 % IV BOLUS (SEPSIS)
1000.0000 mL | Freq: Once | INTRAVENOUS | Status: AC
  Administered 2019-05-06: 1000 mL via INTRAVENOUS

## 2019-05-06 MED ORDER — VANCOMYCIN HCL 1250 MG/250ML IV SOLN
1250.0000 mg | INTRAVENOUS | Status: DC
  Filled 2019-05-06: qty 250

## 2019-05-06 MED ORDER — HEPARIN BOLUS VIA INFUSION
3000.0000 [IU] | Freq: Once | INTRAVENOUS | Status: AC
  Administered 2019-05-06: 3000 [IU] via INTRAVENOUS
  Filled 2019-05-06: qty 3000

## 2019-05-06 MED ORDER — ONDANSETRON HCL 4 MG PO TABS: 4.0000 mg | ORAL_TABLET | Freq: Four times a day (QID) | ORAL | Status: DC | PRN

## 2019-05-06 MED ORDER — ONDANSETRON HCL 4 MG/2ML IJ SOLN: 4.0000 mg | Freq: Four times a day (QID) | INTRAMUSCULAR | Status: DC | PRN

## 2019-05-06 MED ORDER — ALBUTEROL SULFATE HFA 108 (90 BASE) MCG/ACT IN AERS
2.0000 | INHALATION_SPRAY | Freq: Four times a day (QID) | RESPIRATORY_TRACT | Status: DC
  Administered 2019-05-06 – 2019-05-16 (×34): 2 via RESPIRATORY_TRACT
  Filled 2019-05-06 (×3): qty 6.7

## 2019-05-06 MED ORDER — SODIUM BICARBONATE 8.4 % IV SOLN
100.0000 meq | Freq: Once | INTRAVENOUS | Status: AC
  Administered 2019-05-06: 100 meq via INTRAVENOUS

## 2019-05-06 MED ORDER — SODIUM CHLORIDE 0.9 % IV SOLN
0.0000 ug/min | INTRAVENOUS | Status: DC
  Administered 2019-05-06: 20 ug/min via INTRAVENOUS
  Administered 2019-05-06: 10 ug/min via INTRAVENOUS
  Administered 2019-05-07 (×4): 30 ug/min via INTRAVENOUS
  Administered 2019-05-08 (×2): 15 ug/min via INTRAVENOUS
  Filled 2019-05-06 (×2): qty 1
  Filled 2019-05-06: qty 10
  Filled 2019-05-06: qty 1
  Filled 2019-05-06 (×2): qty 10
  Filled 2019-05-06 (×2): qty 1
  Filled 2019-05-06: qty 10

## 2019-05-06 MED ORDER — SODIUM CHLORIDE 0.9 % IV SOLN
2.0000 g | INTRAVENOUS | Status: DC
  Administered 2019-05-06 – 2019-05-07 (×2): 2 g via INTRAVENOUS
  Filled 2019-05-06 (×2): qty 20

## 2019-05-06 MED ORDER — SODIUM CHLORIDE 0.9 % IV SOLN
250.0000 mL | INTRAVENOUS | Status: DC
  Administered 2019-05-06: 250 mL via INTRAVENOUS

## 2019-05-06 MED ORDER — ASCORBIC ACID 500 MG PO TABS
500.0000 mg | ORAL_TABLET | Freq: Every day | ORAL | Status: DC
  Administered 2019-05-08 – 2019-05-14 (×8): 500 mg via ORAL
  Filled 2019-05-06 (×7): qty 1

## 2019-05-06 MED ORDER — SODIUM CHLORIDE 0.9 % IV SOLN
100.0000 mg | Freq: Every day | INTRAVENOUS | Status: AC
  Administered 2019-05-07 – 2019-05-10 (×4): 100 mg via INTRAVENOUS
  Filled 2019-05-06 (×3): qty 100
  Filled 2019-05-06: qty 20
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
## 2019-05-06 NOTE — ED Provider Notes (Signed)
[facility] Emergency Department Provider Note ______   First MD Initiated Contact with Patient 04/28/2019 0259     (approximate)  I have reviewed the triage vital signs and the nursing notes.  Level 5 caveat history review of system limited secondary to altered mental status HISTORY  Chief Complaint Altered Mental Status   HPI Victor Arnold. is a 64 y.o. male with below list of previous medical conditions including recent diagnosis of COVID-19 on 05/01/2019 presents to the emergency department via EMS secondary to altered mental status difficulty breathing.  Per EMS on their arrival patient's blood pressure 68/35.  Patient hypoxic on arrival to the emergency department with oxygen saturation currently 84% on room air.        Past Medical History:  Diagnosis Date  . Diabetes mellitus without complication ([place])   . GERD (gastroesophageal reflux disease)   . Hypertension     Patient Active Problem List   Diagnosis Date Noted  . DKA (diabetic ketoacidoses) ([place]) 05/01/2019  . COVID-19 virus infection 05/01/2019  . Bladder outlet obstruction 05/01/2019  . GERD (gastroesophageal reflux disease) 05/01/2019  . Essential hypertension 05/01/2019  . HLD (hyperlipidemia) 05/01/2019  . Encounter for screening colonoscopy     Past Surgical History:  Procedure Laterality Date  . CATARACT EXTRACTION W/PHACO Right 11/04/2017   Procedure: CATARACT EXTRACTION PHACO AND INTRAOCULAR LENS PLACEMENT ([place]);  Surgeon: Birder Robson, MD;  Location: [facility] ORS;  Service: Ophthalmology;  Laterality: Right;  [place]  01:46 CDE 9.00 Fluid pack lot # [id number]  . CATARACT EXTRACTION W/PHACO Left 12/02/2017   Procedure: CATARACT EXTRACTION PHACO AND INTRAOCULAR LENS PLACEMENT ([facility]);  Surgeon: Birder Robson, MD;  Location: [facility] ORS;  Service: Ophthalmology;  Laterality: Left;  [place] 01:26.9 CDE 9.19 Fluid pack lot # [id number]  . COLONOSCOPY WITH PROPOFOL N/A 08/26/2017   Procedure: COLONOSCOPY WITH PROPOFOL;  Surgeon: Lin Landsman, MD;  Location: [facility] ENDOSCOPY;  Service: Gastroenterology;  Laterality: N/A;  . NO PAST SURGERIES      Prior to Admission medications   Not on File    Allergies Patient has no known allergies.  History reviewed. No pertinent family history.  Social History Social History   Tobacco Use  . Smoking status: Never Smoker  . Smokeless tobacco: Never Used  Substance Use Topics  . Alcohol use: Not Currently  . Drug use: Not Currently    Review of Systems Constitutional: No fever/chills Eyes: No visual changes. ENT: No sore throat. Cardiovascular: Denies chest pain. Respiratory: Coughing on arrival Gastrointestinal: No abdominal pain.  No nausea, no vomiting.  No diarrhea.  No constipation. Genitourinary: Negative for dysuria. Musculoskeletal: Negative for neck pain.  Negative for back pain. Integumentary: Negative for rash. Neurological: Negative for headaches, focal weakness or numbness.   ____________________________________________   PHYSICAL EXAM:  VITAL SIGNS: ED Triage Vitals  Enc Vitals Group     BP 05/09/2019 0301 (!) 76/52     Pulse Rate 04/27/2019 0301 (!) 110     Resp 05/05/2019 0301 (!) 34     Temp 05/18/2019 0337 98 F (36.7 C)     Temp Source 04/24/2019 0337 Oral     SpO2 04/25/2019 0301 90 %     Weight 05/10/2019 0302 79.4 kg (175 lb)     Height 04/29/2019 0302 1.803 m (5\' 11" )     Head Circumference --      Peak Flow --      Pain Score --  Pain Loc --      Pain Edu? --      Excl. in [place]? --     Constitutional: Alert but confused. Eyes: Conjunctivae are normal.  Mouth/Throat: Patient is wearing a mask. Neck: No stridor.  No meningeal signs.   Cardiovascular: Tachycardia, regular rhythm. Good peripheral circulation. Grossly normal heart sounds. Respiratory: Tachypnea, positive accessory respiratory muscle use, diffuse rhonchi. Gastrointestinal: Soft and nontender. No distention.    Musculoskeletal: No lower extremity tenderness nor edema. No gross deformities of extremities. Neurologic:  Normal speech and language. No gross focal neurologic deficits are appreciated.  Skin:  Skin is warm, dry and intact. Psychiatric: Mood and affect are normal. Speech and behavior are normal.  ____________________________________________   LABS (all labs ordered are listed, but only abnormal results are displayed)  Labs Reviewed  COMPREHENSIVE METABOLIC PANEL - Abnormal; Notable for the following components:      Result Value   Sodium 128 (*)    CO2 15 (*)    Glucose, Bld 364 (*)    BUN 41 (*)    Creatinine, Ser 1.67 (*)    Calcium 8.3 (*)    Total Protein 5.7 (*)    Albumin 1.7 (*)    AST 67 (*)    Total Bilirubin 2.0 (*)    GFR calc non Af Amer 42 (*)    GFR calc Af Amer 49 (*)    All other components within normal limits  CBC WITH DIFFERENTIAL/PLATELET - Abnormal; Notable for the following components:   RBC 3.44 (*)    Hemoglobin 10.5 (*)    HCT 30.6 (*)    All other components within normal limits  APTT - Abnormal; Notable for the following components:   aPTT 39 (*)    All other components within normal limits  PROTIME-INR - Abnormal; Notable for the following components:   Prothrombin Time 17.5 (*)    INR 1.4 (*)    All other components within normal limits  BLOOD GAS, VENOUS - Abnormal; Notable for the following components:   pCO2, Ven 22 (*)    pO2, Ven 59.0 (*)    Bicarbonate 14.3 (*)    Acid-base deficit 8.1 (*)    All other components within normal limits  CULTURE, BLOOD (ROUTINE X 2)  CULTURE, BLOOD (ROUTINE X 2)  URINE CULTURE  LACTIC ACID, PLASMA  LACTIC ACID, PLASMA  URINALYSIS, ROUTINE W REFLEX MICROSCOPIC   ____________________________________________  EKG  ED ECG REPORT I, [place] N Giamarie Bueche, the attending physician, personally viewed and interpreted this ECG.   Date: 05/10/2019  EKG Time: 3:01 AM  Rate: 107  Rhythm: Sinus  tachycardia  Axis: Normal  Intervals: Prolonged QTc 552  ST&T Change: None  ____________________________________________  RADIOLOGY I, [place] Warden Buffa, personally viewed and evaluated these images (plain radiographs) as part of my medical decision making, as well as reviewing the written report by the radiologist.  ED MD interpretation: Patchy bibasilar airspace disease consistent with multifocal pneumonia  Official radiology report(s): DG Chest Port 1 View  Result Date: 05/02/2019 CLINICAL DATA:  Dyspnea EXAM: PORTABLE CHEST 1 VIEW COMPARISON:  12/04/2018 FINDINGS: 2 frontal views of the chest demonstrate a stable cardiac silhouette. Patchy bibasilar airspace disease is noted. No effusion or pneumothorax. No acute bony abnormalities. IMPRESSION: 1. Patchy bibasilar airspace disease consistent with multifocal pneumonia or edema. Pattern of airspace disease can be seen with COVID-19. Electronically Signed   By: Randa Ngo M.D.   On: 04/25/2019 03:28     .  Critical Care Performed by: Gregor Hams, MD Authorized by: Gregor Hams, MD   Critical care provider statement:    Critical care time (minutes):  30   Critical care time was exclusive of:  Separately billable procedures and treating other patients   Critical care was necessary to treat or prevent imminent or life-threatening deterioration of the following conditions:  Sepsis   Critical care was time spent personally by me on the following activities:  Development of treatment plan with patient or surrogate, discussions with consultants, evaluation of patient's response to treatment, examination of patient, obtaining history from patient or surrogate, ordering and performing treatments and interventions, ordering and review of laboratory studies, ordering and review of radiographic studies, pulse oximetry, re-evaluation of patient's condition and review of old  charts     ____________________________________________   INITIAL IMPRESSION / MDM / [place] / ED COURSE  As part of my medical decision making, I reviewed the following data within the electronic MEDICAL RECORD NUMBER   64 year old male presented with above-stated history and physical exam consistent with acute sepsis and known COVID-19 infection.  Sepsis protocol initiated patient received 30 mL/kg of normal saline as well as appropriate IV antibiotic therapy.  Patient also received Decadron 10 mg IV remdesivir consult ordered. Patient's blood pressure appears to be improving with IV normal saline after 1-1/2 L and patient's current blood pressure 100/67.  Patient discussed with the ICU staff for hospital admission further evaluation and management. ____________________________________________  FINAL CLINICAL IMPRESSION(S) / ED DIAGNOSES  Final diagnoses:  None     MEDICATIONS GIVEN DURING THIS VISIT:  Medications  sodium chloride 0.9 % bolus 1,000 mL (1,000 mLs Intravenous New Bag/Given 04/24/2019 0317)    And  sodium chloride 0.9 % bolus 1,000 mL (1,000 mLs Intravenous New Bag/Given 05/08/2019 0316)    And  sodium chloride 0.9 % bolus 500 mL (has no administration in time range)  cefTRIAXone (ROCEPHIN) 2 g in sodium chloride 0.9 % 100 mL IVPB (2 g Intravenous New Bag/Given 05/11/2019 0322)  azithromycin (ZITHROMAX) 500 mg in sodium chloride 0.9 % 250 mL IVPB (has no administration in time range)     ED Discharge Orders    None      *Please note:  Victor Arnold. was evaluated in Emergency Department on 05/04/2019 for the symptoms described in the history of present illness. He was evaluated in the context of the global COVID-19 pandemic, which necessitated consideration that the patient might be at risk for infection with the SARS-CoV-2 virus that causes COVID-19. Institutional protocols and algorithms that pertain to the evaluation of patients at risk for COVID-19 are in  a state of rapid change based on information released by regulatory bodies including the CDC and federal and state organizations. These policies and algorithms were followed during the patient's care in the ED.  Some ED evaluations and interventions may be delayed as a result of limited staffing during the pandemic.*  Note:  This document was prepared using Dragon voice recognition software and may include unintentional dictation errors.   Gregor Hams, MD 05/16/2019 [phone]

## 2019-05-06 NOTE — Progress Notes (Signed)
Remdesivir - Pharmacy Brief Note   O:  ALT: 24 CXR: evidence of infection SpO2: 95% on [place]   A/P:  Remdesivir 200 mg IVPB once followed by 100 mg IVPB daily x 4 days.   Hart Robinsons, PharmD Clinical Pharmacist  05/15/2019 4:17 AM

## 2019-05-06 NOTE — ED Notes (Signed)
Blood culture #1 by Annie Main, RN from [facility],   #2 by Rudell Cobb RN at right hand, via butterfly

## 2019-05-06 NOTE — Progress Notes (Signed)
ANTICOAGULATION CONSULT NOTE - Initial Consult  Pharmacy Consult for Heparin Indication: VTE prophylaxis  No Known Allergies  Patient Measurements: Height: 5\' 11"  (180.3 cm) Weight: 79.4 kg (175 lb) IBW/kg (Calculated) : 75.3 HEPARIN DW (KG): 79.4  Vital Signs: Temp: 98 F (36.7 C) (04/15 0337) Temp Source: Oral (04/15 0337) BP: 80/61 (04/15 0445) Pulse Rate: 102 (04/15 0445)  Labs: Recent Labs    05/04/19 0700 05/01/2019 0306  HGB 11.7* 10.5*  HCT 33.5* 30.6*  PLT 241 220  APTT  --  39*  LABPROT  --  17.5*  INR  --  1.4*  CREATININE 0.64 1.67*    Estimated Creatinine Clearance: 47 mL/min (A) (by C-G formula based on SCr of 1.67 mg/dL (H)).   Medical History: Past Medical History:  Diagnosis Date  . Diabetes mellitus without complication ([place])   . GERD (gastroesophageal reflux disease)   . Hypertension     Medications:  (Not in a hospital admission)   Assessment: No PTA meds listed.  Baseline aPTT and INR elevated.  H/H a bit low.  Goal of Therapy:  Heparin level 0.3-0.7 units/ml Monitor platelets by anticoagulation protocol: Yes   Plan:  Heparin 3000 units bolus x 1 then infusion at 1000 units/hr Check HL in 6 hours  Hart Robinsons A 04/24/2019,4:49 AM

## 2019-05-06 NOTE — ED Triage Notes (Signed)
Pt comes from home via EMS where the family reports worsening of conditions for the last several hours. Pt reports to Ems that pt was dx with COVID 2 days ago.  With Ems pt had BG 412, BP 68/35, and altered mental status. Ems administered 769ml of NS IV and reports improvement of mental status.  On arrival pt is hypoxic, RR of 34, hypotensive and altered. Placed on  at 2 LPM

## 2019-05-06 NOTE — Plan of Care (Signed)
Patient confused.

## 2019-05-06 NOTE — Progress Notes (Signed)
CODE SEPSIS - PHARMACY COMMUNICATION  **Broad Spectrum Antibiotics should be administered within 1 hour of Sepsis diagnosis**  Time Code Sepsis Called/Page Received: 0302  Antibiotics Ordered: Zithromax and Rocephin  Time of 1st antibiotic administration: 0322, Rocephin  Additional action taken by pharmacy: n/a  If necessary, Name of Provider/Nurse Contacted: n/a    Ena Dawley ,PharmD Clinical Pharmacist  05/07/2019  3:17 AM

## 2019-05-06 NOTE — Progress Notes (Signed)
Pharmacy Antibiotic Note  Victor Arnold. is a 64 y.o. male admitted on 05/07/2019. Patient recently diagnosed with COVID, presented with worsening condition per family. Patient hypotensive requiring vasopressors, also started on stress dose steroids. Pharmacy has been consulted for vancomycin dosing. Also started on ceftriaxone, azithromycin and remdesivir.  Plan: Vancomycin 1500 mg IV x 1 followed by 1250 mg IV q24hrs  Goal AUC 400-550 Expected AUC: 488 SCr used: 1.49   Height: 5\' 11"  (180.3 cm) Weight: 74.6 kg (164 lb 7.4 oz) IBW/kg (Calculated) : 75.3  Temp (24hrs), Avg:98.1 F (36.7 C), Min:96 F (35.6 C), Max:102.4 F (39.1 C)  Recent Labs  Lab 05/01/19 1317 05/01/19 1826 05/02/19 2058 05/03/19 0208 05/04/19 0700 05/02/2019 0306 04/30/2019 0555 04/26/2019 1055  WBC 10.3  --   --  7.1 6.4 6.2  --   --   CREATININE 1.10   < > 0.63 0.69 0.64 1.67*  --  1.49*  LATICACIDVEN  --   --   --   --   --  4.7* 3.2* 4.3*   < > = values in this interval not displayed.    Estimated Creatinine Clearance: 52.2 mL/min (A) (by C-G formula based on SCr of 1.49 mg/dL (H)).    No Known Allergies  Antimicrobials this admission: Vancomycin 4/15 >> Ceftriaxone 4/15 >> Azithromycin 4/15 >>  Dose adjustments this admission: NA  Microbiology results: 4/15 BCx: NG 4/15 UCx: pending  4/15 MRSA PCR: positive  Thank you for allowing pharmacy to be a part of this patient's care.  Tawnya Crook, PharmD 04/22/2019 1:38 PM

## 2019-05-06 NOTE — ED Notes (Signed)
Lab reports critical lactic acid - 4.7

## 2019-05-06 NOTE — H&P (Signed)
Name: Victor Arnold. MRN: [id number] DOB: May 08, 1955    ADMISSION DATE:  04/23/2019 CONSULTATION DATE: 05/05/2019  REFERRING MD : Dr. Owens Shark   CHIEF COMPLAINT: Shortness of Breath   BRIEF PATIENT DESCRIPTION:  64 yo male with recent CT Abd Pelvis results on 04/10 concerning for high suspicion of low-grade lymphoproliferative disorder oncology recommended once COVID-19 pneumonia resolves pt will need PET scan and biopsy in the outpatient setting.  He was recently hospitalized and diagnosed with COVID-19 on 04/10 but left AMA on 04/14.  He is currently admitted with septic shock and acute hypoxic respiratory failure secondary to COVID-19 pneumonia and acute renal failure with metabolic/lactic acidosis requiring levophed gtt   SIGNIFICANT EVENTS/STUDIES:  04/15: Pt admitted to ICU with COVID-19 pneumonia   CULTURES:  COVID-19 04/10>>positive  MRSA PCR 04/15>> Blood x2 04/15>> Urine 02/15>>  HISTORY OF PRESENT ILLNESS:   This is a 64 yo male with a PMH of HTN, GERD, Type II Diabetes Mellitus, and HLD.  He was admitted to [facility] on 04/10 due to a 3-day hx of abdominal pain, nausea, and vomiting.  He was diagnosed with early DKA, COVID-19 infection, and CT Abd Pelvis revealed enlarged retroperitoneal lymphadenopathy which was unchanged when compared to previous CT results 5 to 6 months ago.  Therefore, oncology consulted and due to high suspicion for low-grade lymphoproliferative disorder due to chronicity of the lymphadenopathy recommended once COVID-19 pneumonia resolved pt will need PET scan and biopsy in the outpatient setting. On 04/14 pt signed out against medical advise.  He presented back to [facility] ER on 04/15 via EMS due to worsening symptoms.  EMS reported upon their arrival at pts home vital signs were: bp 68/35 and cbg 412. He also had altered mental status and noted to be in severe respiratory distress requiring 2L O2 via nasal canula.  EMS administered 700 ml NS bolus with improvement  of bp. Upon arrival to the ER pt hypoxic on RA with O2 sats 84% and remained hypotensive.  Lab results revealed Na+ 128, CO2 15, glucose 364, BUN 41, creatinine 1.67, albumin 1.7, AST 67, troponin 24, lactic acid 4.7, hgb 10.5, and vbg pH 7.42/pCO2 22/bicarb 14.3.  CXR concerning for multifocal pneumonia and possible pulmonary edema. He received 10 mg iv decadron, 2.5L NS bolus, azithromycin, ceftriaxone, and remdesivir ordered.  He remained hypotensive, therefore levophed gtt ordered.  He was subsequently admitted to ICU for additional workup and treatment.   PAST MEDICAL HISTORY :   has a past medical history of Diabetes mellitus without complication ([place]), GERD (gastroesophageal reflux disease), and Hypertension.  has a past surgical history that includes Colonoscopy with propofol (N/A, 08/26/2017); No past surgeries; Cataract extraction w/PHACO (Right, 11/04/2017); and Cataract extraction w/PHACO (Left, 12/02/2017). Prior to Admission medications   Not on File   No Known Allergies  FAMILY HISTORY:  family history is not on file. SOCIAL HISTORY:  reports that he has never smoked. He has never used smokeless tobacco. He reports previous alcohol use. He reports previous drug use.  REVIEW OF SYSTEMS: Positives in BOLD  Constitutional: fever, chills, weight loss, malaise/fatigue and diaphoresis.  HENT: Negative for hearing loss, ear pain, nosebleeds, congestion, sore throat, neck pain, tinnitus and ear discharge.   Eyes: Negative for blurred vision, double vision, photophobia, pain, discharge and redness.  Respiratory: cough, hemoptysis, sputum production, shortness of breath, wheezing and stridor.   Cardiovascular: Negative for chest pain, palpitations, orthopnea, claudication, leg swelling and PND.  Gastrointestinal: Negative for heartburn, nausea,  vomiting, abdominal pain, diarrhea, constipation, blood in stool and melena.  Genitourinary: Negative for dysuria, urgency, frequency, hematuria and  flank pain.  Musculoskeletal: Negative for myalgias, back pain, joint pain and falls.  Skin: Negative for itching and rash.  Neurological: Negative for dizziness, tingling, tremors, sensory change, speech change, focal weakness, seizures, loss of consciousness, weakness and headaches.  Endo/Heme/Allergies: Negative for environmental allergies and polydipsia. Does not bruise/bleed easily.  SUBJECTIVE:  Pt in severe respiratory distress on 2L O2 via nasal canula   VITAL SIGNS: Temp:  [98 F (36.7 C)] 98 F (36.7 C) (04/15 0337) Pulse Rate:  [101-114] 107 (04/15 0415) Resp:  [22-44] 38 (04/15 0415) BP: (76-100)/(52-67) 78/55 (04/15 0415) SpO2:  [90 %-97 %] 96 % (04/15 0415) Weight:  [79.4 kg] 79.4 kg (04/15 0302)  PHYSICAL EXAMINATION: General: acutely ill appearing male, in severe respiratory distress  Neuro: lethargic, confused to situation at times, PERRL, following commands  HEENT: mild JVD present  Cardiovascular: sinsu tach, no R/G  Lungs: expiratory wheezes throughout, tachypneic with use of accessory muscles  Abdomen: +BS x4, soft, non tender, non distended  Musculoskeletal: 1+ bilateral lower extremity edema  Skin: intact no rashes or lesions present   Recent Labs  Lab 05/03/19 0208 05/04/19 0700 05/05/2019 0306  NA 132* 136 128*  K 3.4* 4.1 4.5  CL 100 104 98  CO2 24 23 15*  BUN 14 15 41*  CREATININE 0.69 0.64 1.67*  GLUCOSE 158* 164* 364*   Recent Labs  Lab 05/03/19 0208 05/04/19 0700 04/29/2019 0306  HGB 12.5* 11.7* 10.5*  HCT 34.4* 33.5* 30.6*  WBC 7.1 6.4 PENDING  PLT 232 241 220   DG Chest Port 1 View  Result Date: 05/09/2019 CLINICAL DATA:  Dyspnea EXAM: PORTABLE CHEST 1 VIEW COMPARISON:  12/04/2018 FINDINGS: 2 frontal views of the chest demonstrate a stable cardiac silhouette. Patchy bibasilar airspace disease is noted. No effusion or pneumothorax. No acute bony abnormalities. IMPRESSION: 1. Patchy bibasilar airspace disease consistent with multifocal  pneumonia or edema. Pattern of airspace disease can be seen with COVID-19. Electronically Signed   By: Randa Ngo M.D.   On: 05/18/2019 03:28   ECHOCARDIOGRAM COMPLETE  Result Date: 05/04/2019    ECHOCARDIOGRAM REPORT   Patient Name:   Kristien Mehaffey. Date of Exam: 05/04/2019 Medical Rec #:  [id number]          Height:       69.0 in Accession #:    [id number]         Weight:       165.0 lb Date of Birth:  [birth year]          BSA:          1.904 m Patient Age:    9 years           BP:           105/65 mmHg Patient Gender: M                  HR:           101 bpm. Exam Location:  [facility] Procedure: 2D Echo, Cardiac Doppler, Color Doppler and Strain Analysis Indications:     Dyspnea 786.09  History:         Patient has no prior history of Echocardiogram examinations.                  Risk Factors:Hypertension and Diabetes.  Sonographer:     Sherrie Sport  RDCS (AE) Referring Phys:  [place] Diagnosing Phys: Harrell Gave End MD IMPRESSIONS  1. Left ventricular ejection fraction, by estimation, is 50 to 55%. The left ventricle has low normal function. The left ventricle has no regional wall motion abnormalities. There is mild left ventricular hypertrophy. Left ventricular diastolic parameters are consistent with Grade I diastolic dysfunction (impaired relaxation).  2. Right ventricular systolic function is normal. The right ventricular size is mildly enlarged. There is normal pulmonary artery systolic pressure.  3. The mitral valve is degenerative. Mild mitral valve regurgitation. No evidence of mitral stenosis.  4. The aortic valve is tricuspid. Aortic valve regurgitation is not visualized. Mild aortic valve sclerosis is present, with no evidence of aortic valve stenosis. FINDINGS  Left Ventricle: Left ventricular ejection fraction, by estimation, is 50 to 55%. The left ventricle has low normal function. The left ventricle has no regional wall motion abnormalities. The left ventricular internal cavity size  was normal in size. There is mild left ventricular hypertrophy. Left ventricular diastolic parameters are consistent with Grade I diastolic dysfunction (impaired relaxation). Right Ventricle: The right ventricular size is mildly enlarged. No increase in right ventricular wall thickness. Right ventricular systolic function is normal. There is normal pulmonary artery systolic pressure. Left Atrium: Left atrial size was normal in size. Right Atrium: Right atrial size was normal in size. Pericardium: The pericardium was not well visualized. Mitral Valve: The mitral valve is degenerative in appearance. There is mild thickening of the mitral valve leaflet(s). Mild mitral valve regurgitation. No evidence of mitral valve stenosis. Tricuspid Valve: The tricuspid valve is not well visualized. Tricuspid valve regurgitation is mild. Aortic Valve: The aortic valve is tricuspid. . There is moderate thickening and mild calcification of the aortic valve. Aortic valve regurgitation is not visualized. Mild aortic valve sclerosis is present, with no evidence of aortic valve stenosis. There  is moderate thickening of the aortic valve. There is mild calcification of the aortic valve. Aortic valve mean gradient measures 3.0 mmHg. Aortic valve peak gradient measures 4.5 mmHg. Aortic valve area, by VTI measures 3.01 cm. Pulmonic Valve: The pulmonic valve was not well visualized. Pulmonic valve regurgitation is not visualized. No evidence of pulmonic stenosis. Aorta: The aortic root is normal in size and structure. Pulmonary Artery: The pulmonary artery is not well seen. Venous: The inferior vena cava was not well visualized. IAS/Shunts: The interatrial septum was not well visualized.  LEFT VENTRICLE PLAX 2D LVIDd:         3.52 cm  Diastology LVIDs:         2.56 cm  LV e' lateral:   5.11 cm/s LV PW:         1.17 cm  LV E/e' lateral: 9.1 LV IVS:        1.06 cm  LV e' medial:    5.00 cm/s LVOT diam:     2.10 cm  LV E/e' medial:  9.3 LV SV:          50 LV SV Index:   26       2D Longitudinal Strain LVOT Area:     3.46 cm 2D Strain GLS Avg:     -10.0 %                          3D Volume EF:                         3D  EF:        54 %                         LV EDV:       155 ml                         LV ESV:       70 ml                         LV SV:        84 ml RIGHT VENTRICLE RV Basal diam:  4.30 cm LEFT ATRIUM         Index      RIGHT ATRIUM           Index LA diam:    3.10 cm 1.63 cm/m RA Area:     15.74 cm 8.27 cm/m  AORTIC VALVE                   PULMONIC VALVE AV Area (Vmax):    2.84 cm    RVOT Peak grad: 2 mmHg AV Area (Vmean):   2.62 cm AV Area (VTI):     3.01 cm AV Vmax:           106.00 cm/s AV Vmean:          80.850 cm/s AV VTI:            0.166 m AV Peak Grad:      4.5 mmHg AV Mean Grad:      3.0 mmHg LVOT Vmax:         86.80 cm/s LVOT Vmean:        61.200 cm/s LVOT VTI:          0.144 m LVOT/AV VTI ratio: 0.87  AORTA Ao Root diam: 3.10 cm MITRAL VALVE               TRICUSPID VALVE MV Area (PHT): 4.77 cm    TR Peak grad:   18.3 mmHg MV Decel Time: 159 msec    TR Vmax:        214.00 cm/s MV E velocity: 46.70 cm/s MV A velocity: 68.10 cm/s  SHUNTS MV E/A ratio:  0.69        Systemic VTI:  0.14 m                            Systemic Diam: 2.10 cm Nelva Bush MD Electronically signed by Nelva Bush MD Signature Date/Time: 05/04/2019/2:15:12 PM    Final     ASSESSMENT / PLAN:  Acute hypoxic respiratory failure secondary to metabolic acidosis and [id number] pneumonia  Supplemental O2 for dyspnea and/or hypoxia  Maintain O2 sats 88% and above  Proning as tolerated  Continue remdesivir, decadron, and vitamin regimen  Contnue azithromycin and ceftriaxone  Prn bronchodilator therapy  Aggressive pulmonary hygiene and OOB to chair as tolerated   Septic shock secondary to COVID-19 pneumonia  Mildly elevated troponin secondary to demand ischemia in the setting of acute respiratory failure  Hx: HTN and Hyperlipidemia    Continuous telemetry monitoring  Trend troponin's  Prn neo-synephrine gtt to maintain map >65  Acute renal failure  Lactic and metabolic acidosis  Trend BMP and lactic acid Replace electrolytes as indicated  Monitor UOP Avoid nephrotoxic medications  Will administer 2 amps  of sodium bicarb  Anemia without obvious signs of bleeding  VTE px: heparin gtt  Trend CBC  Monitor for s/sx of bleeding and transfuse for hgb <7  CT Abd Pelvis 04/10: concerning for high suspicion for low-grade lymphoproliferative disorder  GERD  SUP px: iv pepcid  Oncology recommending once COVID-19 pneumonia resolves pt will need PET scan and and biopsy in the outpatient setting   Type II Diabetes Mellitus  CBG q4hrs  SSI   Acute encephalopathy likely secondary to sepsis  Frequent reorientation Avoid sedating medications when possible   Best Practice: VTE px: heparin gtt  SUP px: iv pepcid  Diet: NPO for now until respiratory status improves   Marda Stalker, [place] Pager [phone] (please enter 7 digits) PCCM Consult Pager [phone] (please enter 7 digits)

## 2019-05-06 NOTE — Progress Notes (Signed)
PHARMACY - PHYSICIAN COMMUNICATION CRITICAL VALUE ALERT - BLOOD CULTURE IDENTIFICATION (BCID)  Victor Arnold. is an 64 y.o. male who presented to [facility] on 04/23/2019 with a chief complaint of AMS.  Assessment:  Patient admitted with acute respiratory failure, metabolic acidosis and [id number] PNA. Blood cultures  4 of 4 bottles positive for GPC. BCID detected Staph aureus Mec A (+).   Name of physician (or Provider) Contacted: Dr. Mortimer Fries  Current antibiotics: Vancomycin, ceftriaxone, Azithromycin   Changes to prescribed antibiotics recommended:  Patient is on recommended antibiotics - No changes needed  Results for orders placed or performed during the hospital encounter of 04/28/2019  Blood Culture ID Panel (Reflexed) (Collected: 05/18/2019  3:07 AM)  Result Value Ref Range   Enterococcus species NOT DETECTED NOT DETECTED   Listeria monocytogenes NOT DETECTED NOT DETECTED   Staphylococcus species DETECTED (A) NOT DETECTED   Staphylococcus aureus (BCID) DETECTED (A) NOT DETECTED   Methicillin resistance DETECTED (A) NOT DETECTED   Streptococcus species NOT DETECTED NOT DETECTED   Streptococcus agalactiae NOT DETECTED NOT DETECTED   Streptococcus pneumoniae NOT DETECTED NOT DETECTED   Streptococcus pyogenes NOT DETECTED NOT DETECTED   Acinetobacter baumannii NOT DETECTED NOT DETECTED   Enterobacteriaceae species NOT DETECTED NOT DETECTED   Enterobacter cloacae complex NOT DETECTED NOT DETECTED   Escherichia coli NOT DETECTED NOT DETECTED   Klebsiella oxytoca NOT DETECTED NOT DETECTED   Klebsiella pneumoniae NOT DETECTED NOT DETECTED   Proteus species NOT DETECTED NOT DETECTED   Serratia marcescens NOT DETECTED NOT DETECTED   Haemophilus influenzae NOT DETECTED NOT DETECTED   Neisseria meningitidis NOT DETECTED NOT DETECTED   Pseudomonas aeruginosa NOT DETECTED NOT DETECTED   Candida albicans NOT DETECTED NOT DETECTED   Candida glabrata NOT DETECTED NOT DETECTED   Candida  krusei NOT DETECTED NOT DETECTED   Candida parapsilosis NOT DETECTED NOT DETECTED   Candida tropicalis NOT DETECTED NOT DETECTED    Pernell Dupre, PharmD, BCPS Clinical Pharmacist 05/15/2019 4:57 PM

## 2019-05-06 NOTE — Progress Notes (Addendum)
[place] for Electrolyte Monitoring and Replacement   Recent Labs: Potassium (mmol/L)  Date Value  04/26/2019 4.5   Magnesium (mg/dL)  Date Value  05/15/2019 2.1   Calcium (mg/dL)  Date Value  04/26/2019 7.7 (L)   Albumin (g/dL)  Date Value  05/18/2019 1.5 (L)   Phosphorus (mg/dL)  Date Value  05/01/2019 2.3 (L)   Sodium (mmol/L)  Date Value  05/16/2019 133 (L)     Assessment: 64 year old male admitted after recent diagnosis of COVID and worsening condition. Patient hypotensive requiring pressors. Started on sodium bicarb drip.  Goal of Therapy:  Electrolytes WNL  Plan:  Phos slightly low this morning. Patient unable to take PO meds at this time, will defer replacement for now. Will follow up all electrolytes with morning labs and replace as indicated.  Tawnya Crook ,PharmD Clinical Pharmacist 05/15/2019 1:44 PM

## 2019-05-06 NOTE — Progress Notes (Signed)
Peripherally Inserted Central Catheter Placement  The IV Nurse has discussed with the patient and/or persons authorized to consent for the patient, the purpose of this procedure and the potential benefits and risks involved with this procedure.  The benefits include less needle sticks, lab draws from the catheter, and the patient may be discharged home with the catheter. Risks include, but not limited to, infection, bleeding, blood clot (thrombus formation), and puncture of an artery; nerve damage and irregular heartbeat and possibility to perform a PICC exchange if needed/ordered by physician.  Alternatives to this procedure were also discussed.  Bard Power PICC patient education guide, fact sheet on infection prevention and patient information card has been provided to patient /or left at bedside.    PICC Placement Documentation  PICC Triple Lumen 123456 PICC Right Basilic 37 cm 0 cm (Active)  Indication for Insertion or Continuance of Line Poor Vasculature-patient has had multiple peripheral attempts or PIVs lasting less than 24 hours 05/09/2019 1110  Exposed Catheter (cm) 0 cm 05/14/2019 1110  Site Assessment Clean;Dry;Intact 04/28/2019 1110  Lumen #1 Status Flushed;Saline locked;Blood return noted 04/24/2019 1110  Lumen #2 Status Blood return noted;Saline locked 05/12/2019 1110  Lumen #3 Status Flushed;Saline locked;Blood return noted 05/13/2019 1110  Dressing Type Transparent;Securing device 05/18/2019 1110  Dressing Change Due 05/13/19 05/11/2019 [place] 05/09/2019, 11:13 AM

## 2019-05-07 ENCOUNTER — Inpatient Hospital Stay: Payer: Medicare Other

## 2019-05-07 DIAGNOSIS — R7881 Bacteremia: Secondary | ICD-10-CM

## 2019-05-07 DIAGNOSIS — N179 Acute kidney failure, unspecified: Secondary | ICD-10-CM

## 2019-05-07 DIAGNOSIS — K219 Gastro-esophageal reflux disease without esophagitis: Secondary | ICD-10-CM

## 2019-05-07 DIAGNOSIS — Z960 Presence of urogenital implants: Secondary | ICD-10-CM

## 2019-05-07 DIAGNOSIS — A4102 Sepsis due to Methicillin resistant Staphylococcus aureus: Secondary | ICD-10-CM

## 2019-05-07 DIAGNOSIS — B9562 Methicillin resistant Staphylococcus aureus infection as the cause of diseases classified elsewhere: Secondary | ICD-10-CM

## 2019-05-07 DIAGNOSIS — U071 COVID-19: Secondary | ICD-10-CM

## 2019-05-07 DIAGNOSIS — M7989 Other specified soft tissue disorders: Secondary | ICD-10-CM

## 2019-05-07 DIAGNOSIS — N3289 Other specified disorders of bladder: Secondary | ICD-10-CM

## 2019-05-07 DIAGNOSIS — Z95828 Presence of other vascular implants and grafts: Secondary | ICD-10-CM

## 2019-05-07 DIAGNOSIS — J9601 Acute respiratory failure with hypoxia: Secondary | ICD-10-CM

## 2019-05-07 DIAGNOSIS — E118 Type 2 diabetes mellitus with unspecified complications: Secondary | ICD-10-CM

## 2019-05-07 DIAGNOSIS — G9340 Encephalopathy, unspecified: Secondary | ICD-10-CM

## 2019-05-07 DIAGNOSIS — R6521 Severe sepsis with septic shock: Secondary | ICD-10-CM

## 2019-05-07 DIAGNOSIS — M25431 Effusion, right wrist: Secondary | ICD-10-CM

## 2019-05-07 DIAGNOSIS — I10 Essential (primary) hypertension: Secondary | ICD-10-CM

## 2019-05-07 LAB — C-REACTIVE PROTEIN: CRP: 25.3 mg/dL — ABNORMAL HIGH (ref <1.0)

## 2019-05-07 LAB — CBC WITH DIFFERENTIAL/PLATELET
Abs Immature Granulocytes: 0.09 10*3/uL — ABNORMAL HIGH (ref 0.00–0.07)
Basophils Absolute: 0 10*3/uL (ref 0.0–0.1)
Basophils Relative: 0 %
Eosinophils Absolute: 0 10*3/uL (ref 0.0–0.5)
Eosinophils Relative: 0 %
HCT: 29.8 % — ABNORMAL LOW (ref 39.0–52.0)
Hemoglobin: 10.9 g/dL — ABNORMAL LOW (ref 13.0–17.0)
Immature Granulocytes: 1 %
Lymphocytes Relative: 3 %
Lymphs Abs: 0.4 10*3/uL — ABNORMAL LOW (ref 0.7–4.0)
MCH: 31 pg (ref 26.0–34.0)
MCHC: 36.6 g/dL — ABNORMAL HIGH (ref 30.0–36.0)
MCV: 84.7 fL (ref 80.0–100.0)
Monocytes Absolute: 0.1 10*3/uL (ref 0.1–1.0)
Monocytes Relative: 1 %
Neutro Abs: 13.5 10*3/uL — ABNORMAL HIGH (ref 1.7–7.7)
Neutrophils Relative %: 95 %
Platelets: 174 10*3/uL (ref 150–400)
RBC: 3.52 MIL/uL — ABNORMAL LOW (ref 4.22–5.81)
RDW: 12.2 % (ref 11.5–15.5)
Smear Review: NORMAL
WBC: 14.1 10*3/uL — ABNORMAL HIGH (ref 4.0–10.5)
nRBC: 0 % (ref 0.0–0.2)

## 2019-05-07 LAB — MAGNESIUM: Magnesium: 2.2 mg/dL (ref 1.7–2.4)

## 2019-05-07 LAB — COMPREHENSIVE METABOLIC PANEL
ALT: 27 U/L (ref 0–44)
AST: 62 U/L — ABNORMAL HIGH (ref 15–41)
Albumin: 1.6 g/dL — ABNORMAL LOW (ref 3.5–5.0)
Alkaline Phosphatase: 63 U/L (ref 38–126)
Anion gap: 12 (ref 5–15)
BUN: 45 mg/dL — ABNORMAL HIGH (ref 8–23)
CO2: 26 mmol/L (ref 22–32)
Calcium: 7.8 mg/dL — ABNORMAL LOW (ref 8.9–10.3)
Chloride: 101 mmol/L (ref 98–111)
Creatinine, Ser: 1.32 mg/dL — ABNORMAL HIGH (ref 0.61–1.24)
GFR calc Af Amer: >60 mL/min (ref >60)
GFR calc non Af Amer: 56 mL/min — ABNORMAL LOW (ref >60)
Glucose, Bld: 106 mg/dL — ABNORMAL HIGH (ref 70–99)
Potassium: 3.5 mmol/L (ref 3.5–5.1)
Sodium: 139 mmol/L (ref 135–145)
Total Bilirubin: 1.6 mg/dL — ABNORMAL HIGH (ref 0.3–1.2)
Total Protein: 5.9 g/dL — ABNORMAL LOW (ref 6.5–8.1)

## 2019-05-07 LAB — FERRITIN: Ferritin: 1427 ng/mL — ABNORMAL HIGH (ref 24–336)

## 2019-05-07 LAB — GLUCOSE, CAPILLARY
Glucose-Capillary: 107 mg/dL — ABNORMAL HIGH (ref 70–99)
Glucose-Capillary: 121 mg/dL — ABNORMAL HIGH (ref 70–99)
Glucose-Capillary: 204 mg/dL — ABNORMAL HIGH (ref 70–99)
Glucose-Capillary: 280 mg/dL — ABNORMAL HIGH (ref 70–99)
Glucose-Capillary: 98 mg/dL (ref 70–99)
Glucose-Capillary: 99 mg/dL (ref 70–99)

## 2019-05-07 LAB — PHOSPHORUS: Phosphorus: 2.7 mg/dL (ref 2.5–4.6)

## 2019-05-07 LAB — FIBRIN DERIVATIVES D-DIMER (ARMC ONLY): Fibrin derivatives D-dimer (ARMC): 3836.16 ng/mL (FEU) — ABNORMAL HIGH (ref 0.00–499.00)

## 2019-05-07 MED ORDER — LINEZOLID 600 MG/300ML IV SOLN
600.0000 mg | Freq: Two times a day (BID) | INTRAVENOUS | Status: DC
  Administered 2019-05-08 (×3): 600 mg via INTRAVENOUS
  Filled 2019-05-07 (×5): qty 300

## 2019-05-07 MED ORDER — THIAMINE HCL 100 MG/ML IJ SOLN
500.0000 mg | INTRAVENOUS | Status: AC
  Administered 2019-05-07 – 2019-05-11 (×5): 500 mg via INTRAVENOUS
  Filled 2019-05-07: qty 5
  Filled 2019-05-07: qty 3
  Filled 2019-05-07 (×2): qty 5
  Filled 2019-05-07: qty 2

## 2019-05-07 MED ORDER — VANCOMYCIN HCL 1500 MG/300ML IV SOLN
1500.0000 mg | INTRAVENOUS | Status: DC
  Administered 2019-05-07 – 2019-05-16 (×10): 1500 mg via INTRAVENOUS
  Filled 2019-05-07 (×11): qty 300

## 2019-05-07 MED ORDER — DEXTROSE-NACL 5-0.45 % IV SOLN: INTRAVENOUS | Status: DC

## 2019-05-07 MED ORDER — SODIUM CHLORIDE 0.45 % IV SOLN: INTRAVENOUS | Status: DC

## 2019-05-07 NOTE — Progress Notes (Signed)
Pharmacy Antibiotic Note  Victor Arnold. is a 64 y.o. male admitted on 05/21/2019. Patient recently diagnosed with COVID, presented with worsening condition per family. Patient hypotensive requiring vasopressors, also started on stress dose steroids. Pharmacy has been consulted for vancomycin dosing. Also started on ceftriaxone, azithromycin and remdesivir.  Plan: Blood cultures with MRSA in 4/4 bottles.  Increase vancomycin to 1500 mg IV q24h  Goal AUC 400-550 Expected AUC: 524 SCr used: 1.32   Height: 5\' 11"  (180.3 cm) Weight: 74.6 kg (164 lb 7.4 oz) IBW/kg (Calculated) : 75.3  Temp (24hrs), Avg:98.7 F (37.1 C), Min:95.4 F (35.2 C), Max:102.4 F (39.1 C)  Recent Labs  Lab 05/01/19 1317 05/01/19 1826 05/03/19 0208 05/04/19 0700 05/04/2019 0306 04/23/2019 0555 04/22/2019 1055 05/18/2019 1328 05/07/19 0543  WBC 10.3  --  7.1 6.4 6.2  --   --   --  14.1*  CREATININE 1.10   < > 0.69 0.64 1.67*  --  1.49*  --  1.32*  LATICACIDVEN  --   --   --   --  4.7* 3.2* 4.3* 3.4*  --    < > = values in this interval not displayed.    Estimated Creatinine Clearance: 58.9 mL/min (A) (by C-G formula based on SCr of 1.32 mg/dL (H)).    No Known Allergies  Antimicrobials this admission: Vancomycin 4/15 >> Ceftriaxone 4/15 >> Azithromycin 4/15 >>  Dose adjustments this admission: NA  Microbiology results: 4/15 BCx: 4/4 MRSA 4/15 UCx: pending  4/15 MRSA PCR: positive  Thank you for allowing pharmacy to be a part of this patient's care.  Tawnya Crook, PharmD 05/07/2019 7:59 AM

## 2019-05-07 NOTE — Plan of Care (Signed)
Patient remains stable.

## 2019-05-07 NOTE — Consult Note (Signed)
NAME: Victor Arnold.  DOB: 07-24-1955  MRN: [id number]  Date/Time: 05/07/2019 2:10 PM  REQUESTING PROVIDER: Dr.KASA Subjective:  REASON FOR CONSULT: MRSA bacteremia ?pt is a poor historian- chart reviewed Victor Arnold. is a 64 y.o. male with a history of HTN, DM, GERD was in [facility] between 4/10-4/14 for abdominal pain and diagnosed with DKA/COVID infection-. He left AMA on 4/14 because son was in [place].and returned on 4/15 with worsening condition, altered mental status, and high blood sugar Temp 102.4, BP 76/54.Bld glucose 412. Found to have severe resp distress- CXR showed Patchy bibasilar airspace disease consistent with multifocal pneumonia or edema Was found to be in septic shock, blood cultures sent and started on azithro and ceftriaxone and remdisivr. As blood cultures are positive for MRSA I am seeing the patient  Past Medical History:  Diagnosis Date  . Diabetes mellitus without complication ([place])   . GERD (gastroesophageal reflux disease)   . Hypertension     Past Surgical History:  Procedure Laterality Date  . CATARACT EXTRACTION W/PHACO Right 11/04/2017   Procedure: CATARACT EXTRACTION PHACO AND INTRAOCULAR LENS PLACEMENT ([place]);  Surgeon: Birder Robson, MD;  Location: [facility] ORS;  Service: Ophthalmology;  Laterality: Right;  [place]  01:46 CDE 9.00 Fluid pack lot # [id number]  . CATARACT EXTRACTION W/PHACO Left 12/02/2017   Procedure: CATARACT EXTRACTION PHACO AND INTRAOCULAR LENS PLACEMENT ([facility]);  Surgeon: Birder Robson, MD;  Location: [facility] ORS;  Service: Ophthalmology;  Laterality: Left;  [place] 01:26.9 CDE 9.19 Fluid pack lot # [id number]  . COLONOSCOPY WITH PROPOFOL N/A 08/26/2017   Procedure: COLONOSCOPY WITH PROPOFOL;  Surgeon: Lin Landsman, MD;  Location: [facility];  Service: Gastroenterology;  Laterality: N/A;  . NO PAST SURGERIES      Social History   Socioeconomic History  . Marital status: Single    Spouse name: Not on file  . Number of children:  Not on file  . Years of education: Not on file  . Highest education level: Not on file  Occupational History  . Not on file  Tobacco Use  . Smoking status: Never Smoker  . Smokeless tobacco: Never Used  Substance and Sexual Activity  . Alcohol use: Not Currently  . Drug use: Not Currently  . Sexual activity: Not on file  Other Topics Concern  . Not on file  Social History Narrative  . Not on file   Social Determinants of Health   Financial Resource Strain:   . Difficulty of Paying Living Expenses:   Food Insecurity:   . Worried About Charity fundraiser in the Last Year:   . Arboriculturist in the Last Year:   Transportation Needs:   . Film/video editor (Medical):   Victor Kitchen Lack of Transportation (Non-Medical):   Physical Activity:   . Days of Exercise per Week:   . Minutes of Exercise per Session:   Stress:   . Feeling of Stress :   Social Connections:   . Frequency of Communication with Friends and Family:   . Frequency of Social Gatherings with Friends and Family:   . Attends Religious Services:   . Active Member of Clubs or Organizations:   . Attends Archivist Meetings:   Victor Kitchen Marital Status:   Intimate Partner Violence:   . Fear of Current or Ex-Partner:   . Emotionally Abused:   Victor Kitchen Physically Abused:   . Sexually Abused:     History reviewed. No pertinent family history. No Known Allergies   ?  Current Facility-Administered Medications  Medication Dose Route Frequency Provider Last Rate Last Admin  . 0.9 %  sodium chloride infusion  250 mL Intravenous Continuous Awilda Bill, NP   Stopped at 05/03/2019 [phone]  . acetaminophen (TYLENOL) tablet 650 mg  650 mg Oral Q6H PRN Awilda Bill, NP      . albuterol (VENTOLIN HFA) 108 (90 Base) MCG/ACT inhaler 2 puff  2 puff Inhalation Q6H Awilda Bill, NP   2 puff at 05/07/19 0132  . ascorbic acid (VITAMIN C) tablet 500 mg  500 mg Oral Daily Awilda Bill, NP      . Chlorhexidine Gluconate Cloth 2 %  PADS 6 each  6 each Topical [id number] Flora Lipps, MD   6 each at 05/07/19 0535  . chlorpheniramine-HYDROcodone (TUSSIONEX) 10-8 MG/5ML suspension 5 mL  5 mL Oral Q12H PRN Awilda Bill, NP      . dextrose 5 %-0.45 % sodium chloride infusion   Intravenous Continuous Tyler Pita, MD 100 mL/hr at 05/07/19 1059 New Bag at 05/07/19 1059  . enoxaparin (LOVENOX) injection 40 mg  40 mg Subcutaneous Q12H Flora Lipps, MD   40 mg at 05/07/19 1127  . famotidine (PEPCID) IVPB 20 mg premix  20 mg Intravenous Q12H Awilda Bill, NP 100 mL/hr at 05/07/19 1103 20 mg at 05/07/19 1103  . guaiFENesin-dextromethorphan (ROBITUSSIN DM) 100-10 MG/5ML syrup 10 mL  10 mL Oral Q4H PRN Awilda Bill, NP      . hydrocortisone sodium succinate (SOLU-CORTEF) 100 MG injection 50 mg  50 mg Intravenous Q6H Flora Lipps, MD   50 mg at 05/07/19 1128  . insulin aspart (novoLOG) injection 0-15 Units  0-15 Units Subcutaneous Q4H Awilda Bill, NP   2 Units at 05/07/19 0028  . insulin glargine (LANTUS) injection 10 Units  10 Units Subcutaneous Daily Awilda Bill, NP   10 Units at 05/07/19 1101  . midodrine (PROAMATINE) tablet 10 mg  10 mg Oral TID WC Flora Lipps, MD      . ondansetron (ZOFRAN) tablet 4 mg  4 mg Oral Q6H PRN Awilda Bill, NP       Or  . ondansetron (ZOFRAN) injection 4 mg  4 mg Intravenous Q6H PRN Awilda Bill, NP      . phenylephrine (NEO-SYNEPHRINE) 10 mg in sodium chloride 0.9 % 250 mL (0.04 mg/mL) infusion  0-200 mcg/min Intravenous Titrated Awilda Bill, NP 45 mL/hr at 05/07/19 0720 30 mcg/min at 05/07/19 0720  . remdesivir 100 mg in sodium chloride 0.9 % 100 mL IVPB  100 mg Intravenous Daily Hart Robinsons A, RPH 200 mL/hr at 05/07/19 0941 100 mg at 05/07/19 0941  . sodium chloride flush (NS) 0.9 % injection 10-40 mL  10-40 mL Intracatheter Q12H Flora Lipps, MD   10 mL at 05/14/2019 2143  . sodium chloride flush (NS) 0.9 % injection 10-40 mL  10-40 mL Intracatheter PRN Flora Lipps, MD       . thiamine 500mg  in normal saline (53ml) IVPB  500 mg Intravenous Q24H Tyler Pita, MD 100 mL/hr at 05/07/19 1059 500 mg at 05/07/19 1059  . vancomycin (VANCOREADY) IVPB 1500 mg/300 mL  1,500 mg Intravenous Q24H Flora Lipps, MD 150 mL/hr at 05/07/19 0940 1,500 mg at 05/07/19 0940  . zinc sulfate capsule 220 mg  220 mg Oral Daily Awilda Bill, NP         Abtx:  Anti-infectives (From admission, onward)   Start  Dose/Rate Route Frequency Ordered Stop   05/07/19 1000  remdesivir 100 mg in sodium chloride 0.9 % 100 mL IVPB     100 mg 200 mL/hr over 30 Minutes Intravenous Daily 04/23/2019 0416 05/11/19 0959   05/07/19 1000  vancomycin (VANCOREADY) IVPB 1250 mg/250 mL  Status:  Discontinued     1,250 mg 166.7 mL/hr over 90 Minutes Intravenous Every 24 hours 05/14/2019 1337 05/07/19 0758   05/07/19 1000  vancomycin (VANCOREADY) IVPB 1500 mg/300 mL     1,500 mg 150 mL/hr over 120 Minutes Intravenous Every 24 hours 05/07/19 0758     04/24/2019 0800  vancomycin (VANCOREADY) IVPB 1500 mg/300 mL     1,500 mg 150 mL/hr over 120 Minutes Intravenous  Once 05/12/2019 0734 05/14/2019 1245   05/09/2019 0600  remdesivir 200 mg in sodium chloride 0.9% 250 mL IVPB     200 mg 580 mL/hr over 30 Minutes Intravenous Once 05/03/2019 0416 05/09/2019 0815   04/29/2019 0345  azithromycin (ZITHROMAX) 500 mg in sodium chloride 0.9 % 250 mL IVPB  Status:  Discontinued     500 mg 250 mL/hr over 60 Minutes Intravenous Every 24 hours 05/18/2019 0301 05/07/19 1054   05/09/2019 0315  cefTRIAXone (ROCEPHIN) 2 g in sodium chloride 0.9 % 100 mL IVPB  Status:  Discontinued     2 g 200 mL/hr over 30 Minutes Intravenous Every 24 hours 04/24/2019 0301 05/07/19 1054      REVIEW OF SYSTEMS:  NA as patient is somnolent Objective:  VITALS:  BP 114/65 (BP Location: Right Arm)   Pulse (!) 109   Temp (!) 97.3 F (36.3 C) (Rectal)   Resp (!) 24   Ht 5\' 11"  (1.803 m)   Wt 74.6 kg   SpO2 96%   BMI 22.94 kg/m  PHYSICAL EXAM:    General: somnolent- oriented in person and place Head: Normocephalic, without obvious abnormality, atraumatic. Eyes: Conjunctivae clear, anicteric sclerae. Pupils are equal ENT did not examine Neck: Supple, . Back: did not examine Lungs:b /l crepts present. Heart: Tachycardia Abdomen: Soft, non-tender,not distended. Bowel sounds normal. No masses Extremities:swelling of rt wrist and hand- painful Left forearm swollen Skin: No obvious rash Lymph: Cervical, supraclavicular normal. Neurologic: cannot be assessed  LDA- rt PICC Foley catheter Pertinent Labs Lab Results CBC    Component Value Date/Time   WBC 14.1 (H) 05/07/2019 0543   RBC 3.52 (L) 05/07/2019 0543   HGB 10.9 (L) 05/07/2019 0543   HCT 29.8 (L) 05/07/2019 0543   PLT 174 05/07/2019 0543   MCV 84.7 05/07/2019 0543   MCH 31.0 05/07/2019 0543   MCHC 36.6 (H) 05/07/2019 0543   RDW 12.2 05/07/2019 0543   LYMPHSABS 0.4 (L) 05/07/2019 0543   MONOABS 0.1 05/07/2019 0543   EOSABS 0.0 05/07/2019 0543   BASOSABS 0.0 05/07/2019 0543    CMP Latest Ref Rng & Units 05/07/2019 05/21/2019 05/18/2019  Glucose 70 - 99 mg/dL 106(H) 230(H) 364(H)  BUN 8 - 23 mg/dL 45(H) 42(H) 41(H)  Creatinine 0.61 - 1.24 mg/dL 1.32(H) 1.49(H) 1.67(H)  Sodium 135 - 145 mmol/L 139 133(L) 128(L)  Potassium 3.5 - 5.1 mmol/L 3.5 4.5 4.5  Chloride 98 - 111 mmol/L 101 102 98  CO2 22 - 32 mmol/L 26 19(L) 15(L)  Calcium 8.9 - 10.3 mg/dL 7.8(L) 7.7(L) 8.3(L)  Total Protein 6.5 - 8.1 g/dL 5.9(L) 5.5(L) 5.7(L)  Total Bilirubin 0.3 - 1.2 mg/dL 1.6(H) 1.5(H) 2.0(H)  Alkaline Phos 38 - 126 U/L 63 60 67  AST 15 -  41 U/L 62(H) 73(H) 67(H)  ALT 0 - 44 U/L 27 26 24       Microbiology: Recent Results (from the past 240 hour(s))  Respiratory Panel by RT PCR (Flu A&B, Covid) - Nasopharyngeal Swab     Status: Abnormal   Collection Time: 05/01/19  6:26 PM   Specimen: Nasopharyngeal Swab  Result Value Ref Range Status   SARS Coronavirus 2 by RT PCR POSITIVE (A)  NEGATIVE Final    Comment: RESULT CALLED TO, READ BACK BY AND VERIFIED WITH: STEPHANIE GILES 05/01/19 AT 1952 HS    Influenza A by PCR NEGATIVE NEGATIVE Final   Influenza B by PCR NEGATIVE NEGATIVE Final    Comment: (NOTE) The Xpert Xpress SARS-CoV-2/FLU/RSV assay is intended as an aid in  the diagnosis of influenza from Nasopharyngeal swab specimens and  should not be used as a sole basis for treatment. Nasal washings and  aspirates are unacceptable for Xpert Xpress SARS-CoV-2/FLU/RSV  testing. Fact Sheet for Patients: PinkCheek.be Fact Sheet for Healthcare Providers: GravelBags.it This test is not yet approved or cleared by the [place] FDA and  has been authorized for detection and/or diagnosis of SARS-CoV-2 by  FDA under an Emergency Use Authorization (EUA). This EUA will remain  in effect (meaning this test can be used) for the duration of the  Covid-19 declaration under Section 564(b)(1) of the Act, 21  U.S.C. section 360bbb-3(b)(1), unless the authorization is  terminated or revoked. Performed at [facility], [address] [place], [place] [address]   Blood Culture (routine x 2)     Status: Abnormal (Preliminary result)   Collection Time: 05/08/2019  3:07 AM   Specimen: BLOOD  Result Value Ref Range Status   Specimen Description   Final    BLOOD LEFT [facility] Performed at [facility], [address], [place], [place] [address]    Special Requests   Final    BOTTLES DRAWN AEROBIC AND ANAEROBIC Blood Culture results may not be optimal due to an excessive volume of blood received in culture bottles Performed at [facility], [address], [place], [place] [address]    Culture  Setup Time   Final    GRAM POSITIVE COCCI IN BOTH AEROBIC AND ANAEROBIC BOTTLES CRITICAL VALUE NOTED.  VALUE IS CONSISTENT WITH PREVIOUSLY REPORTED AND CALLED VALUE. Performed at [facility] [address] [place], [place] [address]    Culture STAPHYLOCOCCUS AUREUS (A)  Final   Report Status PENDING  Incomplete  Blood Culture (routine x 2)     Status: Abnormal (Preliminary result)   Collection Time: 04/29/2019  3:07 AM   Specimen: BLOOD  Result Value Ref Range Status   Specimen Description   Final    BLOOD RIGHT HAND Performed at [facility], [address], [place], [place] [address]    Special Requests   Final    BOTTLES DRAWN AEROBIC AND ANAEROBIC Blood Culture results may not be optimal due to an excessive volume of blood received in culture bottles Performed at [facility], [address] [place], [place] [address]    Culture  Setup Time   Final    Organism ID to follow [place] CRITICAL RESULT CALLED TO, READ BACK BY AND VERIFIED WITH: [facility] HALLAJI 05/21/2019 [place] Performed at [facility], [address] [place], [place] [address]    Culture STAPHYLOCOCCUS AUREUS (A)  Final   Report Status PENDING  Incomplete  Blood Culture ID Panel (Reflexed)     Status: Abnormal   Collection Time: 05/11/2019  3:07 AM  Result Value Ref Range Status   Enterococcus species NOT DETECTED NOT DETECTED Final   Listeria monocytogenes NOT DETECTED NOT DETECTED Final   Staphylococcus species DETECTED (A) NOT DETECTED Final    Comment: CRITICAL RESULT CALLED TO, READ BACK BY AND VERIFIED WITH: SHEEMA HALLAJI 05/02/2019 1643 KLW    Staphylococcus aureus (BCID) DETECTED (A) NOT DETECTED Final    Comment: Methicillin (oxacillin)-resistant Staphylococcus aureus (MRSA). MRSA is predictably resistant to beta-lactam antibiotics (except ceftaroline). Preferred therapy is vancomycin unless clinically contraindicated. Patient requires contact precautions if  hospitalized. CRITICAL RESULT CALLED TO, READ BACK BY AND VERIFIED WITH: SHEEMA HALLAJI 05/12/2019 1643 KLW    Methicillin resistance DETECTED (A) NOT DETECTED  Final    Comment: CRITICAL RESULT CALLED TO, READ BACK BY AND VERIFIED WITH: SHEEMA HALLAJI 04/26/2019 1643 KLW    Streptococcus species NOT DETECTED NOT DETECTED Final   Streptococcus agalactiae NOT DETECTED NOT DETECTED Final   Streptococcus pneumoniae NOT DETECTED NOT DETECTED Final   Streptococcus pyogenes NOT DETECTED NOT DETECTED Final   Acinetobacter baumannii NOT DETECTED NOT DETECTED Final   Enterobacteriaceae species NOT DETECTED NOT DETECTED Final   Enterobacter cloacae complex NOT DETECTED NOT DETECTED Final   Escherichia coli NOT DETECTED NOT DETECTED Final   Klebsiella oxytoca NOT DETECTED NOT DETECTED Final   Klebsiella pneumoniae NOT DETECTED NOT DETECTED Final   Proteus species NOT DETECTED NOT DETECTED Final   Serratia marcescens NOT DETECTED NOT DETECTED Final   Haemophilus influenzae NOT DETECTED NOT DETECTED Final   Neisseria meningitidis NOT DETECTED NOT DETECTED Final   Pseudomonas aeruginosa NOT DETECTED NOT DETECTED Final   Candida albicans NOT DETECTED NOT DETECTED Final   Candida glabrata NOT DETECTED NOT DETECTED Final   Candida krusei NOT DETECTED NOT DETECTED Final   Candida parapsilosis NOT DETECTED NOT DETECTED Final   Candida tropicalis NOT DETECTED NOT DETECTED Final    Comment: Performed at [facility], [address] [place], [place] [address]  MRSA PCR Screening     Status: Abnormal   Collection Time: 05/13/2019  5:43 AM   Specimen: Nasal Mucosa; Nasopharyngeal  Result Value Ref Range Status   MRSA by PCR POSITIVE (A) NEGATIVE Final    Comment: RESULT CALLED TO, READ BACK BY AND VERIFIED WITH: Marda Stalker NP [phone] 05/21/2019 [place] Performed at [facility], [address], [place], [place] [address]   Urine culture     Status: Abnormal (Preliminary result)   Collection Time: 05/11/2019  8:22 AM   Specimen: Urine, Random  Result Value Ref Range Status   Specimen Description   Final    URINE, RANDOM Performed at [facility], [address], [place], [place] [address]    Special Requests   Final    NONE Performed at [facility], [address], [place], [place] [address]    Culture (A)  Final    >=100,000 COLONIES/mL STAPHYLOCOCCUS AUREUS SUSCEPTIBILITIES TO FOLLOW Performed at [facility], [place] [address], [place],  [address]    Report Status PENDING  Incomplete    IMAGING RESULTS:  I have personally reviewed the films ? Impression/Recommendation  ?MRSA bacteremia with septic shock- on vancomycin and pressor Very likely has MRSA pneumonia Will add linezolid Needs TEE Repeat Blood cultures On stress dose steroids No hardware  Pt had a PICC placed while bacteremic yesterday- Will  have to remove it when possible- A new PICC cannot be placed until he has cleared the bacteremia  Swelling /tenderness rt wrist and hands- r/o septic arthritis/ Gout Recommend ortho consult  COVID illness recently diagnosed- need to get ct value. On remdisivir  Acute hypoxic respiratory failure- due to pulmonary infiltrates which could be covid pneumonia with secondary MRSA infection  AKI-  DM- poorly controlled- management as per primary team  Encephalopathy due to sepsis  Chronic bladder distension seen on recent CT- pt has a foley now  Previous Ct showed lymphadenopathy in the retroperitoneal and mesenteric chain questioning lymphoproliferative disorder  Discussed the management with care team  ID will follow him remotely this weekend

## 2019-05-07 NOTE — Plan of Care (Signed)
Patient has speech consult today for swallowing eval.

## 2019-05-07 NOTE — Progress Notes (Signed)
CSW attempted call to patient for Readmission Screening. Patient is on airborne precautions. No answer.   Lovena Neighbours [phone]'

## 2019-05-07 NOTE — Progress Notes (Signed)
VAST consulted to assess RA which is reported to be more swollen than yesterday prior to PICC placement. RA TL PICC with 2 lumens infusing. Third lumen flushes easily and with GBR.Right lower arm and hand edematous. Right arm and left arm with equal temperature to touch and strong, equal radial pulses. No redness noted. Pt with groaning to tactile sensation anywhere on body. Lauren, unit RN contacting physician to order doppler for right arm to rule out DVT.  Educated it is best practice to remove peripheral IV's in extremity with PICC as they can infiltrate and interfere with PICC infusion. Also want to decrease risk for infection by removing lines that are not being used. Lauren verbalized understanding.

## 2019-05-07 NOTE — Progress Notes (Addendum)
Name: Victor Arnold. MRN: [id number] DOB: 03/28/1955    ADMISSION DATE:  05/02/2019 INITIAL CONSULTATION DATE: 05/20/2019  REFERRING MD : Dr. Owens Shark   CHIEF COMPLAINT: Shortness of Breath   BRIEF PATIENT DESCRIPTION:  64 yo male with recent CT Abd Pelvis results on 04/10 concerning for high suspicion of low-grade lymphoproliferative disorder oncology recommended once COVID-19 pneumonia resolves pt will need PET scan and biopsy in the outpatient setting.  He was recently hospitalized and diagnosed with COVID-19 on 04/10 but left AMA on 04/14.  He is currently admitted with septic shock and acute hypoxic respiratory failure secondary to COVID-19 pneumonia and acute renal failure with metabolic/lactic acidosis requiring levophed gtt   SIGNIFICANT EVENTS/STUDIES:  04/15: Pt admitted to ICU with COVID-19 pneumonia  04/16: MRSA bacteremia, encephalopathy  CULTURES:  COVID-19 04/10>>positive  MRSA PCR 04/15>> positive Blood x2 04/15>> positive,  MRSA Urine 04/15>> positive, MRSA   . sodium chloride Stopped (05/17/2019 0641)  . dextrose 5 % and 0.45% NaCl 100 mL/hr at 05/07/19 1800  . famotidine (PEPCID) IV 20 mg (05/07/19 1103)  . phenylephrine (NEO-SYNEPHRINE) Adult infusion 30 mcg/min (05/07/19 1526)  . remdesivir 100 mg in NS 100 mL Stopped (05/07/19 1125)  . thiamine injection 500 mg (05/07/19 1059)  . vancomycin Stopped (05/07/19 1140)    No Known Allergies  REVIEW OF SYSTEMS: Positives in BOLD  Constitutional: fever, chills, weight loss, malaise/fatigue and diaphoresis.  HENT: Negative for hearing loss, ear pain, nosebleeds, congestion, sore throat, neck pain, tinnitus and ear discharge.   Eyes: Negative for blurred vision, double vision, photophobia, pain, discharge and redness.  Respiratory: cough, hemoptysis, sputum production, shortness of breath, wheezing and stridor.   Cardiovascular: Negative for chest pain, palpitations, orthopnea, claudication, leg swelling and PND.    Gastrointestinal: Negative for heartburn, nausea, vomiting, abdominal pain, diarrhea, constipation, blood in stool and melena.  Genitourinary: Negative for dysuria, urgency, frequency, hematuria and flank pain.  Musculoskeletal: Negative for myalgias, back pain, joint pain and falls.  Skin: Negative for itching and rash.  Neurological: Negative for dizziness, tingling, tremors, sensory change, speech change, focal weakness, seizures, loss of consciousness, weakness and headaches.  Endo/Heme/Allergies: Negative for environmental allergies and polydipsia. Does not bruise/bleed easily.  SUBJECTIVE:  Pt in mild respiratory distress on 2L O2 via nasal canula.  Lethargic but arousable.  VITAL SIGNS: Temp:  [95.4 F (35.2 C)-101.1 F (38.4 C)] 101.1 F (38.4 C) (04/16 1900) Pulse Rate:  [107-124] 124 (04/16 1900) BP: (87-121)/(56-98) 101/67 (04/16 1900) SpO2:  [93 %-96 %] 93 % (04/16 1900)  PHYSICAL EXAMINATION: General: acutely ill appearing male, in mild respiratory distress  Neuro: lethargic, confused to situation at times, PERRL, following commands  HEENT: Poor dentition, oral mucosa dry. Neck: No JVD present  Cardiovascular: sins tach on monitor, difficult to assess for murmurs due to PPE/CAPR Lungs: Rhonchi, mild tachypnea, difficult to assess due to PPE/CAPR Abdomen: +BS x4, soft, non tender, non distended  Musculoskeletal: Trace bilateral lower extremity edema  Skin: intact no rashes or lesions present   Recent Labs  Lab 04/28/2019 0306 05/18/2019 1055 05/07/19 0543  NA 128* 133* 139  K 4.5 4.5 3.5  CL 98 102 101  CO2 15* 19* 26  BUN 41* 42* 45*  CREATININE 1.67* 1.49* 1.32*  GLUCOSE 364* 230* 106*   Recent Labs  Lab 05/04/19 0700 04/25/2019 0306 05/07/19 0543  HGB 11.7* 10.5* 10.9*  HCT 33.5* 30.6* 29.8*  WBC 6.4 6.2 14.1*  PLT 241 220 174  US Venous Img Upper Uni Right(DVT)  Result Date: 05/07/2019 CLINICAL DATA:  Right upper extremity edema, pain EXAM: RIGHT  UPPER EXTREMITY VENOUS DOPPLER ULTRASOUND TECHNIQUE: Gray-scale sonography with graded compression, as well as color Doppler and duplex ultrasound were performed to evaluate the upper extremity deep venous system from the level of the subclavian vein and including the jugular, axillary, basilic, radial, ulnar and upper cephalic vein. Spectral Doppler was utilized to evaluate flow at rest and with distal augmentation maneuvers. COMPARISON:  None. FINDINGS: Contralateral Subclavian Vein: Respiratory phasicity is normal and symmetric with the symptomatic side. No evidence of thrombus. Normal compressibility. Internal Jugular Vein: No evidence of thrombus. Normal compressibility, respiratory phasicity and response to augmentation. Subclavian Vein: No evidence of thrombus. Normal compressibility, respiratory phasicity and response to augmentation. Axillary Vein: No evidence of thrombus. Normal compressibility, respiratory phasicity and response to augmentation. Cephalic Vein: No evidence of thrombus. Normal compressibility, respiratory phasicity and response to augmentation. Basilic Vein: No evidence of thrombus. Normal compressibility, respiratory phasicity and response to augmentation. Brachial Veins: No evidence of thrombus. Normal compressibility, respiratory phasicity and response to augmentation. Some portions of the brachial vein not visualized because PICC line dressing but no gross thrombus appreciated in the visualized segments. Radial Veins: No evidence of thrombus. Normal compressibility, respiratory phasicity and response to augmentation. Ulnar Veins: No evidence of thrombus. Normal compressibility, respiratory phasicity and response to augmentation. IMPRESSION: No evidence of significant DVT within the right upper extremity. Electronically Signed   By: Jerilynn Mages.  Shick M.D.   On: 05/07/2019 10:18   DG Chest Port 1 View  Result Date: 05/11/2019 CLINICAL DATA:  Dyspnea EXAM: PORTABLE CHEST 1 VIEW COMPARISON:   12/04/2018 FINDINGS: 2 frontal views of the chest demonstrate a stable cardiac silhouette. Patchy bibasilar airspace disease is noted. No effusion or pneumothorax. No acute bony abnormalities. IMPRESSION: 1. Patchy bibasilar airspace disease consistent with multifocal pneumonia or edema. Pattern of airspace disease can be seen with COVID-19. Electronically Signed   By: Randa Ngo M.D.   On: 05/02/2019 03:28   [place] EKG SITE RITE  Result Date: 05/20/2019 If Site Rite image not attached, placement could not be confirmed due to current cardiac rhythm.   ASSESSMENT / PLAN:  Acute hypoxic respiratory failure secondary to metabolic acidosis and [id number] pneumonia Additional complicating factor of MRSA bacteremia Supplemental O2 for dyspnea and/or hypoxia  Maintain O2 sats 88% and above  Proning as tolerated  Continue remdesivir, decadron, and vitamin regimen  Continue vancomycin, discontinue Rocephin and azithromycin PRN bronchodilator therapy  Aggressive pulmonary hygiene and OOB to chair as tolerated Will need TEE when safe from the COVID-19 standpoint Limited 2 D echo for now, rule out vegetations, 4/13 2D echo showed degenerative mitral valve this will be prime for seeding  Septic shock secondary to COVID-19 pneumonia  Mildly elevated troponin secondary to demand ischemia in the setting of acute respiratory failure  Hx: HTN and Hyperlipidemia  Continuous telemetry monitoring  Trend troponin's  PRN neo-synephrine gtt to maintain map >65  Acute renal failure  Lactic and metabolic acidosis  Trend BMP and lactic acid Replace electrolytes as indicated  Monitor UOP Avoid nephrotoxic medications  Recheck lactic acid Discontinue bicarb infusion as CO2 normalized  Anemia without obvious signs of bleeding  VTE px: heparin gtt  Trend CBC  Monitor for s/sx of bleeding and transfuse for hgb <7  CT Abd Pelvis 04/10: concerning for high suspicion for low-grade lymphoproliferative disorder    GERD  SUP px: iv pepcid  Oncology recommending once COVID-19 pneumonia resolves pt will need PET scan and and biopsy in the outpatient setting   Type II Diabetes Mellitus  CBG q4hrs  SSI   Acute encephalopathy likely secondary to sepsis  COVID-19/MRSA associated encephalopathy Frequent reorientation Avoid sedating medications when possible  Poor prognostic sign  Polysubstance abuse Positive UDS for cocaine and opiates Query IV drug use May explain MRSA bacteremia Counseling once able to participate  Best Practice: VTE px: heparin gtt  SUP px: iv pepcid  Diet: Per speech dysphagia 1 diet  Discussed during multidisciplinary rounds.  Renold Don, MD [facility]   *This note was dictated using voice recognition software/Dragon.  Despite best efforts to proofread, errors can occur which can change the meaning.  Any change was purely unintentional.
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
## 2019-05-07 NOTE — Evaluation (Signed)
Clinical/Bedside Swallow Evaluation Patient Details  Name: Victor Arnold. MRN: [id number] Date of Birth: October 21, 1955  Today's Date: 05/07/2019 Time: SLP Start Time (ACUTE ONLY): 1245 SLP Stop Time (ACUTE ONLY): 1345 SLP Time Calculation (min) (ACUTE ONLY): 60 min  Past Medical History:  Past Medical History:  Diagnosis Date  . Diabetes mellitus without complication ([place])   . GERD (gastroesophageal reflux disease)   . Hypertension    Past Surgical History:  Past Surgical History:  Procedure Laterality Date  . CATARACT EXTRACTION W/PHACO Right 11/04/2017   Procedure: CATARACT EXTRACTION PHACO AND INTRAOCULAR LENS PLACEMENT ([place]);  Surgeon: Birder Robson, MD;  Location: [facility] ORS;  Service: Ophthalmology;  Laterality: Right;  [place]  01:46 CDE 9.00 Fluid pack lot # [id number]  . CATARACT EXTRACTION W/PHACO Left 12/02/2017   Procedure: CATARACT EXTRACTION PHACO AND INTRAOCULAR LENS PLACEMENT ([facility]);  Surgeon: Birder Robson, MD;  Location: [facility] ORS;  Service: Ophthalmology;  Laterality: Left;  [place] 01:26.9 CDE 9.19 Fluid pack lot # [id number]  . COLONOSCOPY WITH PROPOFOL N/A 08/26/2017   Procedure: COLONOSCOPY WITH PROPOFOL;  Surgeon: Lin Landsman, MD;  Location: [facility] ENDOSCOPY;  Service: Gastroenterology;  Laterality: N/A;  . NO PAST SURGERIES     HPI:  Pt is a 64 y.o. male with medical history significant for type 2 diabetes, hyperlipidemia and GERD who presents with concerns of abdominal pain. Patient reports that symptoms started about 2 days ago when he felt left upper abdominal pain that felt like a "knot" in his stomach.  Also began to have nausea, vomiting as well as some diarrhea. CXR: patchy bibasilar airspace disease consistent with multifocal pneumonia or edema. MR of the Abd.: Bulky retroperitoneal adenopathy worse along the left periaortic chain, suspicious for lymphoma or metastatic disease. Pt is Positive for COVID-19. Pt is admitted with septic shock and acute hypoxic  respiratory failure secondary to COVID-19 pneumonia and acute renal failure with metabolic/lactic acidosis requiring levophed gtt.    Assessment / Plan / Recommendation Clinical Impression  Pt appears to present w/ concern for oropharyngeal phase dysphagia in light of current medical illness and hospitalization. Pt demonstrated overt s/s of aspiration when drinking thin liquids w/ NSG attempts. Noted pt's general awareness w/ tasks is decreased; his eyes were often closed during po trials; speech mumbled w/ low volume. Pt required verbal cues for follow through w/ instructions. Though, pt stated he felt his "talking was better today". For this BSE, modified liquid consistency was assessed in order to reduce risk for aspiration d/t previously mentioned. Pt appears to present w/ oropharyngeal phase dysphagia w/ increased risk for aspiration. W/ aspiration precautions and modified diet, pt was able to adequately tolerate trials of single ice chips, Nectar liquids, and purees w/ no overt clinical s/s of aspiration noted; no decline in O2 sats(97%) or HR. Vocal quality clear w/ no decline in respiratory status. Oral phase was grossly [facility] for bolus management of trials; min cues given initially to maintain attention to the po boluses, swallow and clear fully. No remaining oral residue noted given time to clear. OM exam appeared [facility]; lingual dryness. Pt required full feeding support overall. Recommend initiation of a Dysphagia level 1 (Puree) diet w/ Nectar liquids; aspiration precautions; Pills CrushedvsWhole in Puree for safer swallowing; feeding support at all meals - pt assisting to hold cup to drink. ST services will continue to f/u w/ trials to upgrade diet consistency as pt's medical/Cognitive status continues to improve.  SLP Visit Diagnosis: Dysphagia, oropharyngeal phase (R13.12)  Aspiration Risk  Mild aspiration risk;Moderate aspiration risk;Risk for inadequate nutrition/hydration    Diet  Recommendation  Dysphagia level 1 (puree) w/ NECTAR consistency liquids; aspiration precautions; feeding support at all meals  Medication Administration: Whole meds with puree(vs need to Crush in Puree for safer swallowing)    Other  Recommendations Recommended Consults: (Dietician f/u) Oral Care Recommendations: Oral care BID;Oral care before and after PO;Staff/trained caregiver to provide oral care Other Recommendations: Order thickener from pharmacy;Prohibited food (jello, ice cream, thin soups);Remove water pitcher;Have oral suction available   Follow up Recommendations Skilled Nursing facility      Frequency and Duration min 3x week  2 weeks       Prognosis Prognosis for Safe Diet Advancement: Fair Barriers to Reach Goals: Time post onset;Severity of deficits(deconditioning)      Swallow Study   General Date of Onset: 05/01/19 HPI: Pt is a 64 y.o. male with medical history significant for type 2 diabetes, hyperlipidemia and GERD who presents with concerns of abdominal pain. Patient reports that symptoms started about 2 days ago when he felt left upper abdominal pain that felt like a "knot" in his stomach.  Also began to have nausea, vomiting as well as some diarrhea. CXR: patchy bibasilar airspace disease consistent with multifocal pneumonia or edema. MR of the Abd.: Bulky retroperitoneal adenopathy worse along the left periaortic chain, suspicious for lymphoma or metastatic disease. Pt is Positive for COVID-19. Pt is admitted with septic shock and acute hypoxic respiratory failure secondary to COVID-19 pneumonia and acute renal failure with metabolic/lactic acidosis requiring levophed gtt.  Type of Study: Bedside Swallow Evaluation Previous Swallow Assessment: none Diet Prior to this Study: NPO(overt s/s of aspiration w/ NSG) Temperature Spikes Noted: No(wbc 14.1) Respiratory Status: Nasal cannula(4L) History of Recent Intubation: No Behavior/Cognition: Alert;Cooperative;Pleasant  mood;Distractible;Requires cueing Oral Cavity Assessment: Dry Oral Care Completed by SLP: Yes Oral Cavity - Dentition: Adequate natural dentition Vision: Functional for self-feeding Self-Feeding Abilities: Able to feed self;Needs assist;Needs set up;Total assist Patient Positioning: Upright in bed(needed positioning) Baseline Vocal Quality: Low vocal intensity Volitional Cough: Weak Volitional Swallow: Able to elicit    Oral/Motor/Sensory Function Overall Oral Motor/Sensory Function: Within functional limits   Ice Chips Ice chips: Within functional limits Presentation: Spoon(fed; 7 trials)   Thin Liquid Thin Liquid: Not tested    Nectar Thick Nectar Thick Liquid: Within functional limits Presentation: Cup;Self Fed;Straw;Spoon(assisted; ~3-4 ozs)   Honey Thick Honey Thick Liquid: Not tested   Puree Puree: Within functional limits Presentation: Spoon(fed; ~3 ozs)   Solid     Solid: Not tested       Orinda Kenner, MS, CCC-SLP Khamia Stambaugh 05/07/2019,3:12 PM

## 2019-05-08 ENCOUNTER — Inpatient Hospital Stay (HOSPITAL_COMMUNITY)
Admit: 2019-05-08 | Discharge: 2019-05-08 | Disposition: A | Discharge status: Home / Self Care | Payer: Medicare Other | Attending: Pulmonary Disease | Admitting: Pulmonary Disease

## 2019-05-08 DIAGNOSIS — I340 Nonrheumatic mitral (valve) insufficiency: Secondary | ICD-10-CM

## 2019-05-08 DIAGNOSIS — I361 Nonrheumatic tricuspid (valve) insufficiency: Secondary | ICD-10-CM

## 2019-05-08 LAB — CBC WITH DIFFERENTIAL/PLATELET
Abs Immature Granulocytes: 0.36 10*3/uL — ABNORMAL HIGH (ref 0.00–0.07)
Basophils Absolute: 0 10*3/uL (ref 0.0–0.1)
Basophils Relative: 0 %
Eosinophils Absolute: 0 10*3/uL (ref 0.0–0.5)
Eosinophils Relative: 0 %
HCT: 30.5 % — ABNORMAL LOW (ref 39.0–52.0)
Hemoglobin: 10.7 g/dL — ABNORMAL LOW (ref 13.0–17.0)
Immature Granulocytes: 3 %
Lymphocytes Relative: 3 %
Lymphs Abs: 0.4 10*3/uL — ABNORMAL LOW (ref 0.7–4.0)
MCH: 30.4 pg (ref 26.0–34.0)
MCHC: 35.1 g/dL (ref 30.0–36.0)
MCV: 86.6 fL (ref 80.0–100.0)
Monocytes Absolute: 0.1 10*3/uL (ref 0.1–1.0)
Monocytes Relative: 1 %
Neutro Abs: 13.6 10*3/uL — ABNORMAL HIGH (ref 1.7–7.7)
Neutrophils Relative %: 93 %
Platelets: 128 10*3/uL — ABNORMAL LOW (ref 150–400)
RBC: 3.52 MIL/uL — ABNORMAL LOW (ref 4.22–5.81)
RDW: 12.8 % (ref 11.5–15.5)
WBC: 14.5 10*3/uL — ABNORMAL HIGH (ref 4.0–10.5)
nRBC: 0 % (ref 0.0–0.2)

## 2019-05-08 LAB — CULTURE, BLOOD (ROUTINE X 2)

## 2019-05-08 LAB — URINE CULTURE: Culture: >=100000 — AB

## 2019-05-08 LAB — GLUCOSE, CAPILLARY
Glucose-Capillary: 358 mg/dL — ABNORMAL HIGH (ref 70–99)
Glucose-Capillary: 286 mg/dL — ABNORMAL HIGH (ref 70–99)
Glucose-Capillary: 193 mg/dL — ABNORMAL HIGH (ref 70–99)
Glucose-Capillary: 206 mg/dL — ABNORMAL HIGH (ref 70–99)
Glucose-Capillary: 189 mg/dL — ABNORMAL HIGH (ref 70–99)
Glucose-Capillary: 150 mg/dL — ABNORMAL HIGH (ref 70–99)

## 2019-05-08 LAB — COMPREHENSIVE METABOLIC PANEL
ALT: 25 U/L (ref 0–44)
AST: 52 U/L — ABNORMAL HIGH (ref 15–41)
Albumin: 1.4 g/dL — ABNORMAL LOW (ref 3.5–5.0)
Alkaline Phosphatase: 116 U/L (ref 38–126)
Anion gap: 11 (ref 5–15)
BUN: 50 mg/dL — ABNORMAL HIGH (ref 8–23)
CO2: 26 mmol/L (ref 22–32)
Calcium: 7.5 mg/dL — ABNORMAL LOW (ref 8.9–10.3)
Chloride: 104 mmol/L (ref 98–111)
Creatinine, Ser: 1.27 mg/dL — ABNORMAL HIGH (ref 0.61–1.24)
GFR calc Af Amer: >60 mL/min (ref >60)
GFR calc non Af Amer: 59 mL/min — ABNORMAL LOW (ref >60)
Glucose, Bld: 286 mg/dL — ABNORMAL HIGH (ref 70–99)
Potassium: 3.2 mmol/L — ABNORMAL LOW (ref 3.5–5.1)
Sodium: 141 mmol/L (ref 135–145)
Total Bilirubin: 1.6 mg/dL — ABNORMAL HIGH (ref 0.3–1.2)
Total Protein: 6 g/dL — ABNORMAL LOW (ref 6.5–8.1)

## 2019-05-08 LAB — ECHOCARDIOGRAM LIMITED
Height: 71 in
Weight: 2631.41 oz

## 2019-05-08 LAB — FERRITIN: Ferritin: 1915 ng/mL — ABNORMAL HIGH (ref 24–336)

## 2019-05-08 LAB — MAGNESIUM: Magnesium: 2.3 mg/dL (ref 1.7–2.4)

## 2019-05-08 LAB — PHOSPHORUS: Phosphorus: 3.9 mg/dL (ref 2.5–4.6)

## 2019-05-08 LAB — FIBRIN DERIVATIVES D-DIMER (ARMC ONLY): Fibrin derivatives D-dimer (ARMC): 3292.05 ng/mL (FEU) — ABNORMAL HIGH (ref 0.00–499.00)

## 2019-05-08 LAB — LACTIC ACID, PLASMA: Lactic Acid, Venous: 3.7 mmol/L (ref 0.5–1.9)

## 2019-05-08 MED ORDER — ALBUMIN HUMAN 25 % IV SOLN
25.0000 g | Freq: Three times a day (TID) | INTRAVENOUS | Status: AC
  Administered 2019-05-08 – 2019-05-10 (×6): 25 g via INTRAVENOUS
  Filled 2019-05-08 (×6): qty 100

## 2019-05-08 MED ORDER — POTASSIUM CHLORIDE 10 MEQ/100ML IV SOLN
10.0000 meq | INTRAVENOUS | Status: AC
  Administered 2019-05-08 (×4): 10 meq via INTRAVENOUS
  Filled 2019-05-08 (×4): qty 100

## 2019-05-08 MED ORDER — MORPHINE SULFATE (PF) 2 MG/ML IV SOLN
1.0000 mg | INTRAVENOUS | Status: DC | PRN
  Administered 2019-05-08 – 2019-05-09 (×5): 2 mg via INTRAVENOUS
  Filled 2019-05-08 (×6): qty 1

## 2019-05-08 NOTE — Progress Notes (Signed)
Name: Victor Arnold. MRN: [id number] DOB: 1955-05-20    ADMISSION DATE:  05/07/2019 INITIAL CONSULTATION DATE: 04/25/2019  REFERRING MD : Dr. Owens Shark   CHIEF COMPLAINT: Shortness of Breath   BRIEF PATIENT DESCRIPTION:  64 yo male with recent CT Abd Pelvis results on 04/10 concerning for high suspicion of low-grade lymphoproliferative disorder oncology recommended once COVID-19 pneumonia resolves pt will need PET scan and biopsy in the outpatient setting.  He was recently hospitalized and diagnosed with COVID-19 on 04/10 but left AMA on 04/14.  He is currently admitted with septic shock and acute hypoxic respiratory failure secondary to COVID-19 pneumonia and acute renal failure with metabolic/lactic acidosis requiring levophed gtt   SIGNIFICANT EVENTS/STUDIES:  04/15: Pt admitted to ICU with COVID-19 pneumonia  04/16: MRSA bacteremia, encephalopathy 04/17: Mentation clearing, no increase in oxygen requirement  CULTURES:  COVID-19 04/10>>positive  MRSA PCR 04/15>> positive Blood x2 04/15>> positive,  MRSA Urine 04/15>> positive, MRSA    sodium chloride 75 mL/hr at 05/08/19 1301   sodium chloride Stopped (04/26/2019 0641)   famotidine (PEPCID) IV Stopped (05/08/19 0944)   linezolid (ZYVOX) IV Stopped (05/08/19 0900)   phenylephrine (NEO-SYNEPHRINE) Adult infusion 15 mcg/min (05/08/19 1710)   remdesivir 100 mg in NS 100 mL Stopped (05/08/19 0950)   thiamine injection Stopped (05/08/19 1035)   vancomycin Stopped (05/08/19 1045)    No Known Allergies  REVIEW OF SYSTEMS: Positives in BOLD  Constitutional: fever, chills, weight loss, malaise/fatigue and diaphoresis.  HENT: Negative for hearing loss, ear pain, nosebleeds, congestion, sore throat, neck pain, tinnitus and ear discharge.   Eyes: Negative for blurred vision, double vision, photophobia, pain, discharge and redness.  Respiratory: cough, hemoptysis, sputum production, shortness of breath, wheezing and stridor.     Cardiovascular: Negative for chest pain, palpitations, orthopnea, claudication, leg swelling and PND.  Gastrointestinal: Negative for heartburn, nausea, vomiting, abdominal pain, diarrhea, constipation, blood in stool and melena.  Genitourinary: Negative for dysuria, urgency, frequency, hematuria and flank pain.  Musculoskeletal: Negative for myalgias, back pain, joint pain and falls.  Skin: Negative for itching and rash.  Neurological: Negative for dizziness, tingling, tremors, sensory change, speech change, focal weakness, seizures, loss of consciousness, weakness and headaches.  Endo/Heme/Allergies: Negative for environmental allergies and polydipsia. Does not bruise/bleed easily.  SUBJECTIVE:  No distress on 2L O2 via nasal canula.  Lethargic but arousable.  VITAL SIGNS: Temp:  [98.2 F (36.8 C)-101.5 F (38.6 C)] 100.9 F (38.3 C) (04/17 1900) Pulse Rate:  [99-128] 106 (04/17 1900) Resp:  [9-31] 22 (04/17 1900) BP: (100-140)/(56-121) 114/69 (04/17 1900) SpO2:  [92 %-99 %] 96 % (04/17 1900)  PHYSICAL EXAMINATION: General: acutely ill appearing male, no respiratory distress, somewhat labile today Neuro: Sleepy but easily arousable, PERRL, following commands  HEENT: Poor dentition, oral mucosa dry. Neck: No JVD present  Cardiovascular: sinus tach on monitor, difficult to assess for murmurs due to PPE/CAPR Lungs: Rhonchi, mild tachypnea, difficult to assess due to PPE/CAPR Abdomen: +BS x4, soft, non tender, non distended  Musculoskeletal: Trace bilateral lower extremity edema  Skin: intact no rashes or lesions present   Recent Labs  Lab 04/26/2019 1055 05/07/19 0543 05/08/19 0318  NA 133* 139 141  K 4.5 3.5 3.2*  CL 102 101 104  CO2 19* 26 26  BUN 42* 45* 50*  CREATININE 1.49* 1.32* 1.27*  GLUCOSE 230* 106* 286*   Recent Labs  Lab 05/10/2019 0306 05/07/19 0543 05/08/19 0318  HGB 10.5* 10.9* 10.7*  HCT 30.6* 29.8*  30.5*  WBC 6.2 14.1* 14.5*  PLT 220 174 128*   US  Venous Img Upper Uni Right(DVT)  Result Date: 05/07/2019 CLINICAL DATA:  Right upper extremity edema, pain EXAM: RIGHT UPPER EXTREMITY VENOUS DOPPLER ULTRASOUND TECHNIQUE: Gray-scale sonography with graded compression, as well as color Doppler and duplex ultrasound were performed to evaluate the upper extremity deep venous system from the level of the subclavian vein and including the jugular, axillary, basilic, radial, ulnar and upper cephalic vein. Spectral Doppler was utilized to evaluate flow at rest and with distal augmentation maneuvers. COMPARISON:  None. FINDINGS: Contralateral Subclavian Vein: Respiratory phasicity is normal and symmetric with the symptomatic side. No evidence of thrombus. Normal compressibility. Internal Jugular Vein: No evidence of thrombus. Normal compressibility, respiratory phasicity and response to augmentation. Subclavian Vein: No evidence of thrombus. Normal compressibility, respiratory phasicity and response to augmentation. Axillary Vein: No evidence of thrombus. Normal compressibility, respiratory phasicity and response to augmentation. Cephalic Vein: No evidence of thrombus. Normal compressibility, respiratory phasicity and response to augmentation. Basilic Vein: No evidence of thrombus. Normal compressibility, respiratory phasicity and response to augmentation. Brachial Veins: No evidence of thrombus. Normal compressibility, respiratory phasicity and response to augmentation. Some portions of the brachial vein not visualized because PICC line dressing but no gross thrombus appreciated in the visualized segments. Radial Veins: No evidence of thrombus. Normal compressibility, respiratory phasicity and response to augmentation. Ulnar Veins: No evidence of thrombus. Normal compressibility, respiratory phasicity and response to augmentation. IMPRESSION: No evidence of significant DVT within the right upper extremity. Electronically Signed   By: Jerilynn Mages.  Shick M.D.   On: 05/07/2019  10:18   ECHOCARDIOGRAM LIMITED  Result Date: 05/08/2019    ECHOCARDIOGRAM LIMITED REPORT   Patient Name:   Victor Arnold. Date of Exam: 05/08/2019 Medical Rec #:  [id number]          Height:       71.0 in Accession #:    [id number]         Weight:       164.5 lb Date of Birth:  1955-12-04          BSA:          1.940 m Patient Age:    71 years           BP:           100/63 mmHg Patient Gender: M                  HR:           98 bpm. Exam Location:  [facility] Procedure: Limited Echo Indications:     SBE  History:         Patient has prior history of Echocardiogram examinations. Risk                  Factors:Hypertension, Dyslipidemia and Covid +.  Sonographer:     Raliegh Ip Thornton-Maynard Referring Phys:  [id number] Talbert Trembath Veda Canning Diagnosing Phys: Ida Rogue MD IMPRESSIONS  1. Left ventricular ejection fraction, by estimation, is 45%. The left ventricle has mildly decreased function. The left ventricle has no regional wall motion abnormalities. Left ventricular diastolic parameters are consistent with Grade I diastolic dysfunction (impaired relaxation).  2. Right ventricular systolic function is normal. The right ventricular size is normal. There is normal pulmonary artery systolic pressure.  3. No valve vegetation noted. FINDINGS  Left Ventricle: Left ventricular ejection fraction, by estimation, is 40 to 45%. The left ventricle has  mildly decreased function. The left ventricle has no regional wall motion abnormalities. The left ventricular internal cavity size was normal in size. There is no left ventricular hypertrophy. Right Ventricle: The right ventricular size is normal. No increase in right ventricular wall thickness. Right ventricular systolic function is normal. There is normal pulmonary artery systolic pressure. The tricuspid regurgitant velocity is 2.32 m/s, and  with an assumed right atrial pressure of 5 mmHg, the estimated right ventricular systolic pressure is [id number] mmHg. Left Atrium: Left atrial size was  normal in size. Right Atrium: Right atrial size was normal in size. Pericardium: There is no evidence of pericardial effusion. Mitral Valve: The mitral valve is normal in structure. Normal mobility of the mitral valve leaflets. Mild mitral valve regurgitation. No evidence of mitral valve stenosis. Tricuspid Valve: The tricuspid valve is normal in structure. Tricuspid valve regurgitation is mild . No evidence of tricuspid stenosis. Aortic Valve: The aortic valve was not well visualized. Aortic valve regurgitation is not visualized. Mild aortic valve sclerosis is present, with no evidence of aortic valve stenosis. Aortic valve peak gradient measures 4.8 mmHg. Pulmonic Valve: The pulmonic valve was normal in structure. Pulmonic valve regurgitation is not visualized. No evidence of pulmonic stenosis. Aorta: The aortic root is normal in size and structure. Venous: The inferior vena cava is normal in size with greater than 50% respiratory variability, suggesting right atrial pressure of 3 mmHg. IAS/Shunts: No atrial level shunt detected by color flow Doppler.  LEFT VENTRICLE PLAX 2D LVOT diam:     2.30 cm  Diastology LV SV:         45       LV e' lateral:   4.57 cm/s LV SV Index:   23       LV E/e' lateral: 11.6 LVOT Area:     4.15 cm LV e' medial:    6.42 cm/s                         LV E/e' medial:  8.2  AORTIC VALVE AV Area (Vmax): 2.18 cm AV Vmax:        110.00 cm/s AV Peak Grad:   4.8 mmHg LVOT Vmax:      57.60 cm/s LVOT Vmean:     45.800 cm/s LVOT VTI:       0.109 m MV E velocity: 52.90 cm/s  TRICUSPID VALVE MV A velocity: 59.20 cm/s  TR Peak grad:   21.5 mmHg MV E/A ratio:  0.89        TR Vmax:        232.00 cm/s                             SHUNTS                            Systemic VTI:  0.11 m                            Systemic Diam: 2.30 cm Ida Rogue MD Electronically signed by Ida Rogue MD Signature Date/Time: 05/08/2019/3:20:39 PM    Final     ASSESSMENT / PLAN:  Acute hypoxic respiratory  failure secondary to metabolic acidosis and [id number] pneumonia Additional complicating factor of MRSA bacteremia Supplemental O2 for dyspnea and/or hypoxia  Maintain O2 sats 88% and above  Continue  remdesivir last dose (4/20), hydrocortisone, and vitamin regimen  Continue vancomycin, on linezolid PRN bronchodilator therapy  Aggressive pulmonary hygiene and OOB to chair as tolerated Limited 2D echo showed no valvular vegetations will need TEE at a later time  Septic shock secondary to COVID-19 pneumonia and MRSA bacteremia Mildly elevated troponin secondary to demand ischemia in the setting of acute respiratory failure  Hx: HTN and Hyperlipidemia  Continuous telemetry monitoring  Trend troponin's  Neo-Synephrine being weaned off On midodrine  Acute renal failure  Lactic and metabolic acidosis  Trend BMP and lactic acid Replace electrolytes as indicated  Monitor UOP Avoid nephrotoxic medications as able Trend lactic acid  Anemia without obvious signs of bleeding  VTE px: heparin gtt  Trend CBC  Monitor for s/sx of bleeding and transfuse for hgb <7  CT Abd Pelvis 04/10: concerning for high suspicion for low-grade lymphoproliferative disorder  GERD  SUP px: iv pepcid  Oncology recommending once COVID-19 pneumonia resolves pt will need PET scan and and biopsy in the outpatient setting   Type II Diabetes Mellitus  CBG q4hrs  SSI   Severe protein calorie malnutrition Severe hypoalbuminemia Taking p.o. now dysphagia diet 1 Albumin 1.4 Supplemental albumin vis--vis septic shock  Acute encephalopathy likely secondary to sepsis  COVID-19/MRSA associated encephalopathy Frequent reorientation Avoid sedating medications when possible  Slowly improving  Polysubstance abuse Positive UDS for cocaine and opiates Query IV drug use, patient evasive as to form of use May explain MRSA bacteremia Counseling once able to participate  Best Practice: VTE px: heparin gtt  SUP px: iv  pepcid  Diet: Per speech dysphagia 1 diet  Discussed during multidisciplinary rounds.  Renold Don, MD [facility]   *This note was dictated using voice recognition software/Dragon.  Despite best efforts to proofread, errors can occur which can change the meaning.  Any change was purely unintentional.

## 2019-05-08 NOTE — Progress Notes (Signed)
Pharmacy Antibiotic Note  Victor Routh. is a 64 y.o. male admitted on 04/26/2019. Patient recently diagnosed with COVID, presented with worsening condition per family. Patient hypotensive requiring vasopressors, also started on stress dose steroids. Pharmacy has been consulted for vancomycin dosing. ID has started linezolid.   Plan: Blood cultures with MRSA in 4/4 bottles.  Continue vancomycin to 1500 mg IV q24h  Goal AUC 400-550 Expected AUC: 501.7 SCr used: 1.27   Height: 5\' 11"  (180.3 cm) Weight: 74.6 kg (164 lb 7.4 oz) IBW/kg (Calculated) : 75.3  Temp (24hrs), Avg:100.2 F (37.9 C), Min:98.2 F (36.8 C), Max:101.5 F (38.6 C)  Recent Labs  Lab 05/03/19 0208 05/03/19 0208 05/04/19 0700 05/02/2019 0306 04/30/2019 0555 05/05/2019 1055 05/10/2019 1328 05/07/19 0543 05/08/19 0318  WBC 7.1  --  6.4 6.2  --   --   --  14.1* 14.5*  CREATININE 0.69   < > 0.64 1.67*  --  1.49*  --  1.32* 1.27*  LATICACIDVEN  --   --   --  4.7* 3.2* 4.3* 3.4*  --  3.7*   < > = values in this interval not displayed.    Estimated Creatinine Clearance: 61.2 mL/min (A) (by C-G formula based on SCr of 1.27 mg/dL (H)).    No Known Allergies  Antimicrobials this admission: Vancomycin 4/15 >> Ceftriaxone 4/15 >> Azithromycin 4/15 >> Linezolid 4/17>>   Dose adjustments this admission: NA  Microbiology results: 4/15 BCx: 4/4 MRSA 4/15 UCx: MRSA   4/15 MRSA PCR: positive  Thank you for allowing pharmacy to be a part of this patient's care.  Pernell Dupre, PharmD, BCPS Clinical Pharmacist 05/08/2019 11:34 AM

## 2019-05-08 NOTE — Progress Notes (Signed)
[place] for Electrolyte Monitoring and Replacement   Recent Labs: Potassium (mmol/L)  Date Value  05/08/2019 3.2 (L)   Magnesium (mg/dL)  Date Value  05/08/2019 2.3   Calcium (mg/dL)  Date Value  05/08/2019 7.5 (L)   Albumin (g/dL)  Date Value  05/08/2019 1.4 (L)   Phosphorus (mg/dL)  Date Value  05/08/2019 3.9   Sodium (mmol/L)  Date Value  05/08/2019 141     Assessment: 64 year old male admitted after recent diagnosis of COVID and worsening condition. Patient hypotensive requiring pressors. Started on sodium bicarb drip.  Goal of Therapy:  Electrolytes WNL  Plan:  Will order KCL 5mEq IV x 4 doses.   Will follow up all electrolytes with morning labs and replace as indicated.  Pernell Dupre, PharmD, BCPS Clinical Pharmacist 05/08/2019 11:45 AM

## 2019-05-09 LAB — GLUCOSE, CAPILLARY
Glucose-Capillary: 105 mg/dL — ABNORMAL HIGH (ref 70–99)
Glucose-Capillary: 117 mg/dL — ABNORMAL HIGH (ref 70–99)
Glucose-Capillary: 89 mg/dL (ref 70–99)
Glucose-Capillary: 210 mg/dL — ABNORMAL HIGH (ref 70–99)
Glucose-Capillary: 330 mg/dL — ABNORMAL HIGH (ref 70–99)
Glucose-Capillary: 305 mg/dL — ABNORMAL HIGH (ref 70–99)

## 2019-05-09 LAB — CBC WITH DIFFERENTIAL/PLATELET
Abs Immature Granulocytes: 0.58 10*3/uL — ABNORMAL HIGH (ref 0.00–0.07)
Basophils Absolute: 0 10*3/uL (ref 0.0–0.1)
Basophils Relative: 0 %
Eosinophils Absolute: 0 10*3/uL (ref 0.0–0.5)
Eosinophils Relative: 0 %
HCT: 27.9 % — ABNORMAL LOW (ref 39.0–52.0)
Hemoglobin: 9.9 g/dL — ABNORMAL LOW (ref 13.0–17.0)
Immature Granulocytes: 8 %
Lymphocytes Relative: 6 %
Lymphs Abs: 0.4 10*3/uL — ABNORMAL LOW (ref 0.7–4.0)
MCH: 30.7 pg (ref 26.0–34.0)
MCHC: 35.5 g/dL (ref 30.0–36.0)
MCV: 86.6 fL (ref 80.0–100.0)
Monocytes Absolute: 0.2 10*3/uL (ref 0.1–1.0)
Monocytes Relative: 2 %
Neutro Abs: 5.9 10*3/uL (ref 1.7–7.7)
Neutrophils Relative %: 84 %
Platelets: 73 10*3/uL — ABNORMAL LOW (ref 150–400)
RBC: 3.22 MIL/uL — ABNORMAL LOW (ref 4.22–5.81)
RDW: 13.1 % (ref 11.5–15.5)
WBC: 7.1 10*3/uL (ref 4.0–10.5)
nRBC: 0 % (ref 0.0–0.2)

## 2019-05-09 LAB — MAGNESIUM: Magnesium: 2.5 mg/dL — ABNORMAL HIGH (ref 1.7–2.4)

## 2019-05-09 LAB — COMPREHENSIVE METABOLIC PANEL
ALT: 29 U/L (ref 0–44)
AST: 55 U/L — ABNORMAL HIGH (ref 15–41)
Albumin: 1.7 g/dL — ABNORMAL LOW (ref 3.5–5.0)
Alkaline Phosphatase: 183 U/L — ABNORMAL HIGH (ref 38–126)
Anion gap: 9 (ref 5–15)
BUN: 49 mg/dL — ABNORMAL HIGH (ref 8–23)
CO2: 25 mmol/L (ref 22–32)
Calcium: 7.3 mg/dL — ABNORMAL LOW (ref 8.9–10.3)
Chloride: 108 mmol/L (ref 98–111)
Creatinine, Ser: 0.97 mg/dL (ref 0.61–1.24)
GFR calc Af Amer: >60 mL/min (ref >60)
GFR calc non Af Amer: >60 mL/min (ref >60)
Glucose, Bld: 113 mg/dL — ABNORMAL HIGH (ref 70–99)
Potassium: 3 mmol/L — ABNORMAL LOW (ref 3.5–5.1)
Sodium: 142 mmol/L (ref 135–145)
Total Bilirubin: 1.8 mg/dL — ABNORMAL HIGH (ref 0.3–1.2)
Total Protein: 6.2 g/dL — ABNORMAL LOW (ref 6.5–8.1)

## 2019-05-09 LAB — FIBRIN DERIVATIVES D-DIMER (ARMC ONLY): Fibrin derivatives D-dimer (ARMC): 3573.72 ng/mL (FEU) — ABNORMAL HIGH (ref 0.00–499.00)

## 2019-05-09 LAB — LACTIC ACID, PLASMA: Lactic Acid, Venous: 2.2 mmol/L (ref 0.5–1.9)

## 2019-05-09 LAB — C-REACTIVE PROTEIN
CRP: 18.4 mg/dL — ABNORMAL HIGH (ref <1.0)
CRP: 17.5 mg/dL — ABNORMAL HIGH (ref <1.0)

## 2019-05-09 LAB — FERRITIN: Ferritin: 2145 ng/mL — ABNORMAL HIGH (ref 24–336)

## 2019-05-09 LAB — PHOSPHORUS: Phosphorus: 3.7 mg/dL (ref 2.5–4.6)

## 2019-05-09 LAB — APTT
aPTT: 38 seconds — ABNORMAL HIGH (ref 24–36)
aPTT: 57 seconds — ABNORMAL HIGH (ref 24–36)
aPTT: 66 seconds — ABNORMAL HIGH (ref 24–36)
aPTT: 70 seconds — ABNORMAL HIGH (ref 24–36)

## 2019-05-09 LAB — PROTIME-INR
INR: 1.4 — ABNORMAL HIGH (ref 0.8–1.2)
Prothrombin Time: 16.6 seconds — ABNORMAL HIGH (ref 11.4–15.2)

## 2019-05-09 MED ORDER — DEXAMETHASONE 4 MG PO TABS
6.0000 mg | ORAL_TABLET | Freq: Every day | ORAL | Status: DC
  Administered 2019-05-09 – 2019-05-18 (×10): 6 mg via ORAL
  Filled 2019-05-09: qty 2
  Filled 2019-05-09 (×11): qty 1.5

## 2019-05-09 MED ORDER — ARGATROBAN 50 MG/50ML IV SOLN
0.5000 ug/kg/min | INTRAVENOUS | Status: DC
  Administered 2019-05-09 – 2019-05-12 (×4): 0.5 ug/kg/min via INTRAVENOUS
  Filled 2019-05-09 (×5): qty 50

## 2019-05-09 MED ORDER — POTASSIUM CHLORIDE 20 MEQ PO PACK
40.0000 meq | PACK | ORAL | Status: AC
  Administered 2019-05-09 (×2): 40 meq via ORAL
  Filled 2019-05-09 (×3): qty 2

## 2019-05-09 NOTE — Progress Notes (Addendum)
[place] for Electrolyte Monitoring and Replacement   Recent Labs: Potassium (mmol/L)  Date Value  05/09/2019 3.0 (L)   Magnesium (mg/dL)  Date Value  05/09/2019 2.5 (H)   Calcium (mg/dL)  Date Value  05/09/2019 7.3 (L)   Albumin (g/dL)  Date Value  05/09/2019 1.7 (L)   Phosphorus (mg/dL)  Date Value  05/09/2019 3.7   Sodium (mmol/L)  Date Value  05/09/2019 142   Corrected Calcium: 8.34  Assessment: 64 year old male admitted after recent diagnosis of COVID and worsening condition. Patient hypotensive requiring pressors. Started on sodium bicarb drip.  Goal of Therapy:  Electrolytes WNL  Plan:  Patient has been taking PO meds since 4/16. Will order KCL 34mq every 4 hours x 2   Will follow up all electrolytes with morning labs and replace as indicated.  Pernell Dupre, PharmD, BCPS Clinical Pharmacist 05/09/2019 7:23 AM

## 2019-05-09 NOTE — Progress Notes (Signed)
Name: Victor Arnold. MRN: [id number] DOB: [birth year]    ADMISSION DATE:  04/27/2019 INITIAL CONSULTATION DATE: 04/28/2019  REFERRING MD : Dr. Owens Shark   CHIEF COMPLAINT: Shortness of Breath   BRIEF PATIENT DESCRIPTION:  64 yo male with recent CT Abd Pelvis results on 04/10 concerning for high suspicion of low-grade lymphoproliferative disorder oncology recommended once COVID-19 pneumonia resolves pt will need PET scan and biopsy in the outpatient setting.  He was recently hospitalized and diagnosed with COVID-19 on 04/10 but left AMA on 04/14.  He is currently admitted with septic shock and acute hypoxic respiratory failure secondary to COVID-19 pneumonia and acute renal failure with metabolic/lactic acidosis requiring levophed gtt   SIGNIFICANT EVENTS/STUDIES:  04/15: Pt admitted to ICU with COVID-19 pneumonia  04/16: MRSA bacteremia, encephalopathy 04/17: Mentation clearing, no increase in oxygen requirement 04/18: Worsening thrombocytopenia, work-up for HIT, switched to argatroban, discontinued linezolid  CULTURES:  COVID-19 04/10>>positive  MRSA PCR 04/15>> positive Blood x2 04/15>> positive,  MRSA Urine 04/15>> positive, MRSA   . sodium chloride 75 mL/hr at 05/09/19 1550  . sodium chloride Stopped (04/22/2019 0641)  . albumin human 25 g (05/09/19 1507)  . argatroban 0.5 mcg/kg/min (05/09/19 1106)  . famotidine (PEPCID) IV 20 mg (05/09/19 0908)  . phenylephrine (NEO-SYNEPHRINE) Adult infusion Stopped (05/09/19 0930)  . remdesivir 100 mg in NS 100 mL 100 mg (05/09/19 1039)  . thiamine injection 500 mg (05/09/19 0947)  . vancomycin 1,500 mg (05/09/19 1135)    Allergies  Allergen Reactions  . Heparin     HIT Ab pending     REVIEW OF SYSTEMS: Limited due to patient's critically ill status  SUBJECTIVE:  No distress on 2L O2 via nasal canula.  Voices no complaint today.  Still slow with responses.  VITAL SIGNS: Temp:  [98.4 F (36.9 C)-101.5 F (38.6 C)] 98.6 F (37  C) (04/18 1100) Pulse Rate:  [88-108] 92 (04/18 1100) Resp:  [17-26] 21 (04/18 1100) BP: (102-125)/(60-89) 114/70 (04/18 1100) SpO2:  [95 %-99 %] 95 % (04/18 1100)  PHYSICAL EXAMINATION: General: acutely ill appearing male, no respiratory distress, eating meals with assistance, no dysphagia Neuro: Sleepy but easily arousable, PERRL, following commands  HEENT: Poor dentition, oral mucosa moist. Neck: No JVD present  Cardiovascular: sinus tach on monitor, difficult to assess for murmurs due to PPE/CAPR Lungs: Rhonchi, mild tachypnea, difficult to assess due to PPE/CAPR Abdomen: +BS x4, soft, non tender, slightly distended but soft, has been having bowel movements Musculoskeletal: Trace bilateral lower extremity edema  Skin: intact no rashes or lesions present   Recent Labs  Lab 05/07/19 0543 05/08/19 0318 05/09/19 0334  NA 139 141 142  K 3.5 3.2* 3.0*  CL 101 104 108  CO2 26 26 25   BUN 45* 50* 49*  CREATININE 1.32* 1.27* 0.97  GLUCOSE 106* 286* 113*   Recent Labs  Lab 05/07/19 0543 05/08/19 0318 05/09/19 0334  HGB 10.9* 10.7* 9.9*  HCT 29.8* 30.5* 27.9*  WBC 14.1* 14.5* 7.1  PLT 174 128* 73*   ECHOCARDIOGRAM LIMITED  Result Date: 05/08/2019    ECHOCARDIOGRAM LIMITED REPORT   Patient Name:   Victor Arnold. Date of Exam: 05/08/2019 Medical Rec #:  [id number]          Height:       71.0 in Accession #:    [id number]         Weight:       164.5 lb Date of Birth:  02/26/1955  BSA:          1.940 m Patient Age:    37 years           BP:           100/63 mmHg Patient Gender: M                  HR:           98 bpm. Exam Location:  [facility] Procedure: Limited Echo Indications:     SBE  History:         Patient has prior history of Echocardiogram examinations. Risk                  Factors:Hypertension, Dyslipidemia and Covid +.  Sonographer:     Raliegh Ip Thornton-Maynard Referring Phys:  [id number] Annalyce Lanpher Veda Canning Diagnosing Phys: Ida Rogue MD IMPRESSIONS  1. Left ventricular  ejection fraction, by estimation, is 45%. The left ventricle has mildly decreased function. The left ventricle has no regional wall motion abnormalities. Left ventricular diastolic parameters are consistent with Grade I diastolic dysfunction (impaired relaxation).  2. Right ventricular systolic function is normal. The right ventricular size is normal. There is normal pulmonary artery systolic pressure.  3. No valve vegetation noted. FINDINGS  Left Ventricle: Left ventricular ejection fraction, by estimation, is 40 to 45%. The left ventricle has mildly decreased function. The left ventricle has no regional wall motion abnormalities. The left ventricular internal cavity size was normal in size. There is no left ventricular hypertrophy. Right Ventricle: The right ventricular size is normal. No increase in right ventricular wall thickness. Right ventricular systolic function is normal. There is normal pulmonary artery systolic pressure. The tricuspid regurgitant velocity is 2.32 m/s, and  with an assumed right atrial pressure of 5 mmHg, the estimated right ventricular systolic pressure is [id number] mmHg. Left Atrium: Left atrial size was normal in size. Right Atrium: Right atrial size was normal in size. Pericardium: There is no evidence of pericardial effusion. Mitral Valve: The mitral valve is normal in structure. Normal mobility of the mitral valve leaflets. Mild mitral valve regurgitation. No evidence of mitral valve stenosis. Tricuspid Valve: The tricuspid valve is normal in structure. Tricuspid valve regurgitation is mild . No evidence of tricuspid stenosis. Aortic Valve: The aortic valve was not well visualized. Aortic valve regurgitation is not visualized. Mild aortic valve sclerosis is present, with no evidence of aortic valve stenosis. Aortic valve peak gradient measures 4.8 mmHg. Pulmonic Valve: The pulmonic valve was normal in structure. Pulmonic valve regurgitation is not visualized. No evidence of pulmonic  stenosis. Aorta: The aortic root is normal in size and structure. Venous: The inferior vena cava is normal in size with greater than 50% respiratory variability, suggesting right atrial pressure of 3 mmHg. IAS/Shunts: No atrial level shunt detected by color flow Doppler.  LEFT VENTRICLE PLAX 2D LVOT diam:     2.30 cm  Diastology LV SV:         45       LV e' lateral:   4.57 cm/s LV SV Index:   23       LV E/e' lateral: 11.6 LVOT Area:     4.15 cm LV e' medial:    6.42 cm/s                         LV E/e' medial:  8.2  AORTIC VALVE AV Area (Vmax): 2.18 cm AV Vmax:  110.00 cm/s AV Peak Grad:   4.8 mmHg LVOT Vmax:      57.60 cm/s LVOT Vmean:     45.800 cm/s LVOT VTI:       0.109 m MV E velocity: 52.90 cm/s  TRICUSPID VALVE MV A velocity: 59.20 cm/s  TR Peak grad:   21.5 mmHg MV E/A ratio:  0.89        TR Vmax:        232.00 cm/s                             SHUNTS                            Systemic VTI:  0.11 m                            Systemic Diam: 2.30 cm Ida Rogue MD Electronically signed by Ida Rogue MD Signature Date/Time: 05/08/2019/3:20:39 PM    Final     ASSESSMENT / PLAN:  Acute hypoxic respiratory failure secondary to metabolic acidosis and [id number] pneumonia Additional complicating factor of MRSA bacteremia Supplemental O2 for dyspnea and/or hypoxia  Maintain O2 sats 88% and above  Continue remdesivir last dose (4/20), decrease steroids, switch to dexamethasone, and vitamin regimen  Continue vancomycin, discontinue linezolid due to precipitous drop in platelets PRN bronchodilator therapy  Aggressive pulmonary hygiene and OOB to chair as tolerated Limited 2D echo showed no valvular vegetations will need TEE at a later time  Septic shock secondary to COVID-19 pneumonia and MRSA bacteremia Mildly elevated troponin secondary to demand ischemia in the setting of acute respiratory failure  Hx: HTN and Hyperlipidemia  Continuous telemetry monitoring  Troponins have leveled  off Off of Neo-Synephrine On midodrine Discontinue stress dose steroids  Acute renal failure  Lactic and metabolic acidosis  Trend BMP and lactic acid Replace electrolytes as indicated  Monitor UOP Avoid nephrotoxic medications as able Trend lactic acid on downward trend  Anemia without obvious signs of bleeding  VTE px: Had been switched to Lovenox, because of thrombocytopenia assessing HIT panel Trend CBC  Monitor for s/sx of bleeding and transfuse for hgb <7  CT Abd Pelvis 04/10: concerning for high suspicion for low-grade lymphoproliferative disorder  GERD  SUP px: iv pepcid  Oncology recommending once COVID-19 pneumonia resolves pt will need PET scan and and biopsy in the outpatient setting   Type II Diabetes Mellitus Poor control CBG q4hrs  SSI  Adjusted insulin Discontinue stress dose steroids switch to single dose dexamethasone  Severe protein calorie malnutrition Severe hypoalbuminemia Taking p.o. now dysphagia diet 1 Albumin 1.7 Supplementing albumin   Acute encephalopathy likely secondary to sepsis  COVID-19/MRSA associated encephalopathy Frequent reorientation Avoid sedating medications when possible  Continues to improve  Polysubstance abuse Positive UDS for cocaine and opiates Query IV drug use, patient evasive as to form of use May explain MRSA bacteremia Counseling once able to participate  Best Practice: VTE px: Switched to argatroban due to thrombocytopenia, HIT work-up in progress SUP px: iv pepcid  Diet: Per speech dysphagia 1 diet, patient tolerating  May be able to transfer out of ICU in the morning.  Renold Don, MD [facility]   *This note was dictated using voice recognition software/Dragon.  Despite best efforts to proofread, errors can occur which can change the meaning.  Any change  was purely unintentional.

## 2019-05-09 NOTE — Progress Notes (Addendum)
Pharmacy Antibiotic Note  Victor Arnold. is a 64 y.o. male admitted on 04/30/2019. Patient recently diagnosed with COVID, presented with worsening condition per family. Patient hypotensive requiring vasopressors, also started on stress dose steroids. Pharmacy has been consulted for vancomycin dosing. ID has started linezolid.   Blood cultures with MRSA in 4/4 bottles.  Day 4 vancomycin, day 3 Linezolid.   Plan: Continue vancomycin to 1500 mg IV q24h  Goal AUC 400-550 Expected AUC: 501.7 SCr used: 1.27  Will order peak level after 4/19 AM dose mornings dose and trough level prior to 4/20 morning dose.    Height: 5\' 11"  (180.3 cm) Weight: 74.6 kg (164 lb 7.4 oz) IBW/kg (Calculated) : 75.3  Temp (24hrs), Avg:99.8 F (37.7 C), Min:98.2 F (36.8 C), Max:101.5 F (38.6 C)  Recent Labs  Lab 05/04/19 0700 05/04/19 0700 05/05/2019 0306 04/24/2019 0306 05/17/2019 0555 05/09/2019 1055 05/11/2019 1328 05/07/19 0543 05/08/19 0318 05/09/19 0334  WBC 6.4  --  6.2  --   --   --   --  14.1* 14.5* 7.1  CREATININE 0.64   < > 1.67*  --   --  1.49*  --  1.32* 1.27* 0.97  LATICACIDVEN  --   --  4.7*   < > 3.2* 4.3* 3.4*  --  3.7* 2.2*   < > = values in this interval not displayed.    Estimated Creatinine Clearance: 80.1 mL/min (by C-G formula based on SCr of 0.97 mg/dL).    No Known Allergies  Antimicrobials this admission: Vancomycin 4/15 >> Ceftriaxone 4/15 >> Azithromycin 4/15 >> Linezolid 4/17>>   Dose adjustments this admission: NA  Microbiology results: 4/15 BCx: 4/4 MRSA 4/15 UCx: MRSA   4/15 MRSA PCR: positive  Thank you for allowing pharmacy to be a part of this patient's care.  Pernell Dupre, PharmD, BCPS Clinical Pharmacist 05/09/2019 7:27 AM

## 2019-05-09 NOTE — Consult Note (Addendum)
ANTICOAGULATION CONSULT NOTE - Initial Consult  Pharmacy Consult for Argatroban  Indication: Possible HIT  Allergies  Allergen Reactions  . Heparin     HIT Ab pending    Patient Measurements: Height: 5\' 11"  (180.3 cm) Weight: 74.6 kg (164 lb 7.4 oz) IBW/kg (Calculated) : 75.3  Vital Signs: Temp: 99 F (37.2 C) (04/18 0600) BP: 123/89 (04/18 0600) Pulse Rate: 89 (04/18 0600)  Labs: Recent Labs    05/07/19 0543 05/07/19 0543 05/08/19 0318 05/09/19 0334  HGB 10.9*   < > 10.7* 9.9*  HCT 29.8*  --  30.5* 27.9*  PLT 174  --  128* 73*  CREATININE 1.32*  --  1.27* 0.97   < > = values in this interval not displayed.    Estimated Creatinine Clearance: 80.1 mL/min (by C-G formula based on SCr of 0.97 mg/dL).   Medical History: Past Medical History:  Diagnosis Date  . Diabetes mellitus without complication ([place])   . GERD (gastroesophageal reflux disease)   . Hypertension      Assessment: Pharmacy consulted for argatroban dosing and monitoring in 64 yo male for suspected HIT. 4Ts Score 5 (Intermediate Probability of HIT ~14%).  Patient is being treated with vancomycin for MRSA bacteremia. Patient is also COVID (+), D-Dimer: [id number]. Linezolid started 4/16 PM for possible MRSA PNA. Now Dcd per Dr. Patsey Berthold.   Patient received Enoxaparin 40mg  4/17 @ 2327  PLts: 241>>220>> 174>>128>>73  DATE TIME APTT Argatroban Rate 4/18 0812 38 Started @ 0.5 mcg/kg/min 4/18 1112 57 Continue current rate   Goal of Therapy:  aPTT 50-90 seconds Monitor platelets by anticoagulation protocol: Yes   Plan:  Argatroban @ 0.75mcg/kg/min. aPTT level order 2 hours after argatroban infusion in started.   Pharmacy will adjust per aPTT response every 2 hours until two therapeutic levels are obtained.   CBC and aPTT order at least once daily per protocol.   Pernell Dupre, PharmD, BCPS Clinical Pharmacist 05/09/2019 8:09 AM

## 2019-05-09 NOTE — Consult Note (Signed)
ANTICOAGULATION CONSULT NOTE - Initial Consult  Pharmacy Consult for Argatroban  Indication: Possible HIT  Allergies  Allergen Reactions  . Heparin     HIT Ab pending    Patient Measurements: Height: 5\' 11"  (180.3 cm) Weight: 74.6 kg (164 lb 7.4 oz) IBW/kg (Calculated) : 75.3  Vital Signs: Temp: 98.6 F (37 C) (04/18 1100) Temp Source: Core (04/18 0800) BP: 114/70 (04/18 1100) Pulse Rate: 92 (04/18 1100)  Labs: Recent Labs    05/07/19 0543 05/07/19 0543 05/08/19 0318 05/09/19 0334 05/09/19 0812 05/09/19 1112 05/09/19 1340  HGB 10.9*   < > 10.7* 9.9*  --   --   --   HCT 29.8*  --  30.5* 27.9*  --   --   --   PLT 174  --  128* 73*  --   --   --   APTT  --   --   --   --  38* 57* 66*  LABPROT  --   --   --   --  16.6*  --   --   INR  --   --   --   --  1.4*  --   --   CREATININE 1.32*  --  1.27* 0.97  --   --   --    < > = values in this interval not displayed.    Estimated Creatinine Clearance: 80.1 mL/min (by C-G formula based on SCr of 0.97 mg/dL).   Medical History: Past Medical History:  Diagnosis Date  . Diabetes mellitus without complication ([place])   . GERD (gastroesophageal reflux disease)   . Hypertension      Assessment: Pharmacy consulted for argatroban dosing and monitoring in 64 yo male for suspected HIT. 4Ts Score 5 (Intermediate Probability of HIT ~14%).  Patient is being treated with vancomycin for MRSA bacteremia. Patient is also COVID (+), D-Dimer: [id number]. Linezolid started 4/16 PM for possible MRSA PNA. Now Dcd per Dr. Patsey Berthold.   Patient received Enoxaparin 40mg  4/17 @ 2327  PLts: 241>>220>> 174>>128>>73  DATE TIME APTT Argatroban Rate 4/18 0812 38 Started @ 0.5 mcg/kg/min 4/18 1112 57 Continue current rate 4/18  1340    64        Continue current rate    Goal of Therapy:  aPTT 50-90 seconds Monitor platelets by anticoagulation protocol: Yes   Plan:  4/18 @ 1340. APTT 66. Level in therapeutic x2.  Continue Argatroban @  0.57mcg/kg/min.  Due to jump in aPTT from 57 to 66, will order one more confirmatory level in 2 hours.   Pharmacy will adjust per aPTT response every 2 hours until two therapeutic levels are obtained.   CBC and aPTT order at least once daily per protocol.   Pernell Dupre, PharmD, BCPS Clinical Pharmacist 05/09/2019 2:22 PM

## 2019-05-09 NOTE — Consult Note (Signed)
ANTICOAGULATION CONSULT NOTE - Initial Consult  Pharmacy Consult for Argatroban  Indication: Possible HIT  Allergies  Allergen Reactions  . Heparin     HIT Ab pending    Patient Measurements: Height: 5\' 11"  (180.3 cm) Weight: 74.6 kg (164 lb 7.4 oz) IBW/kg (Calculated) : 75.3  Vital Signs: Temp: 98.6 F (37 C) (04/18 1600) Temp Source: Core (04/18 0800) BP: 113/65 (04/18 1800) Pulse Rate: 94 (04/18 1800)  Labs: Recent Labs    05/07/19 0543 05/07/19 0543 05/08/19 0318 05/09/19 0334 05/09/19 0812 05/09/19 0812 05/09/19 1112 05/09/19 1340 05/09/19 1615  HGB 10.9*   < > 10.7* 9.9*  --   --   --   --   --   HCT 29.8*  --  30.5* 27.9*  --   --   --   --   --   PLT 174  --  128* 73*  --   --   --   --   --   APTT  --   --   --   --  38*   < > 57* 66* 70*  LABPROT  --   --   --   --  16.6*  --   --   --   --   INR  --   --   --   --  1.4*  --   --   --   --   CREATININE 1.32*  --  1.27* 0.97  --   --   --   --   --    < > = values in this interval not displayed.    Estimated Creatinine Clearance: 80.1 mL/min (by C-G formula based on SCr of 0.97 mg/dL).   Medical History: Past Medical History:  Diagnosis Date  . Diabetes mellitus without complication ([place])   . GERD (gastroesophageal reflux disease)   . Hypertension      Assessment: Pharmacy consulted for argatroban dosing and monitoring in 64 yo male for suspected HIT. 4Ts Score 5 (Intermediate Probability of HIT ~14%).  Patient is being treated with vancomycin for MRSA bacteremia. Patient is also COVID (+), D-Dimer: [id number]. Linezolid started 4/16 PM for possible MRSA PNA. Now Dcd per Dr. Patsey Berthold.   Patient received Enoxaparin 40mg  4/17 @ 2327  PLts: 241>>220>> 174>>128>>73  DATE TIME APTT Argatroban Rate 4/18 0812 38 Started @ 0.5 mcg/kg/min 4/18 1112 57 Continue current rate 4/18  1340    64        Continue current rate    Goal of Therapy:  aPTT 50-90 seconds Monitor platelets by anticoagulation  protocol: Yes   Plan:  4/18 @ 1340. APTT 66. Level in therapeutic x2.  Continue Argatroban @ 0.76mcg/kg/min.  Due to jump in aPTT from 57 to 66, will order one more confirmatory level in 2 hours.   Pharmacy will adjust per aPTT response every 2 hours until two therapeutic levels are obtained.   4/18: APTT @ 1615 = 70 Will continue pt on current rate and recheck aPTT on 4/19 with AM labs.   CBC and aPTT order at least once daily per protocol.   Orene Desanctis, PharmD Clinical Pharmacist 05/09/2019 6:32 PM

## 2019-05-10 LAB — COMPREHENSIVE METABOLIC PANEL
ALT: 25 U/L (ref 0–44)
AST: 35 U/L (ref 15–41)
Albumin: 2.2 g/dL — ABNORMAL LOW (ref 3.5–5.0)
Alkaline Phosphatase: 161 U/L — ABNORMAL HIGH (ref 38–126)
Anion gap: 8 (ref 5–15)
BUN: 43 mg/dL — ABNORMAL HIGH (ref 8–23)
CO2: 23 mmol/L (ref 22–32)
Calcium: 7.8 mg/dL — ABNORMAL LOW (ref 8.9–10.3)
Chloride: 115 mmol/L — ABNORMAL HIGH (ref 98–111)
Creatinine, Ser: 0.67 mg/dL (ref 0.61–1.24)
GFR calc Af Amer: >60 mL/min (ref >60)
GFR calc non Af Amer: >60 mL/min (ref >60)
Glucose, Bld: 144 mg/dL — ABNORMAL HIGH (ref 70–99)
Potassium: 3 mmol/L — ABNORMAL LOW (ref 3.5–5.1)
Sodium: 146 mmol/L — ABNORMAL HIGH (ref 135–145)
Total Bilirubin: 1.5 mg/dL — ABNORMAL HIGH (ref 0.3–1.2)
Total Protein: 6 g/dL — ABNORMAL LOW (ref 6.5–8.1)

## 2019-05-10 LAB — FIBRIN DERIVATIVES D-DIMER (ARMC ONLY): Fibrin derivatives D-dimer (ARMC): 4331.09 ng/mL (FEU) — ABNORMAL HIGH (ref 0.00–499.00)

## 2019-05-10 LAB — CBC WITH DIFFERENTIAL/PLATELET
Abs Immature Granulocytes: 0.05 10*3/uL (ref 0.00–0.07)
Basophils Absolute: 0 10*3/uL (ref 0.0–0.1)
Basophils Relative: 1 %
Eosinophils Absolute: 0 10*3/uL (ref 0.0–0.5)
Eosinophils Relative: 0 %
HCT: 26.9 % — ABNORMAL LOW (ref 39.0–52.0)
Hemoglobin: 9.1 g/dL — ABNORMAL LOW (ref 13.0–17.0)
Immature Granulocytes: 1 %
Lymphocytes Relative: 6 %
Lymphs Abs: 0.3 10*3/uL — ABNORMAL LOW (ref 0.7–4.0)
MCH: 30 pg (ref 26.0–34.0)
MCHC: 33.8 g/dL (ref 30.0–36.0)
MCV: 88.8 fL (ref 80.0–100.0)
Monocytes Absolute: 0.1 10*3/uL (ref 0.1–1.0)
Monocytes Relative: 2 %
Neutro Abs: 4.1 10*3/uL (ref 1.7–7.7)
Neutrophils Relative %: 90 %
Platelets: 50 10*3/uL — ABNORMAL LOW (ref 150–400)
RBC: 3.03 MIL/uL — ABNORMAL LOW (ref 4.22–5.81)
RDW: 13.3 % (ref 11.5–15.5)
WBC Morphology: INCREASED
WBC: 4.6 10*3/uL (ref 4.0–10.5)
nRBC: 0 % (ref 0.0–0.2)

## 2019-05-10 LAB — GLUCOSE, CAPILLARY
Glucose-Capillary: 112 mg/dL — ABNORMAL HIGH (ref 70–99)
Glucose-Capillary: 121 mg/dL — ABNORMAL HIGH (ref 70–99)
Glucose-Capillary: 211 mg/dL — ABNORMAL HIGH (ref 70–99)
Glucose-Capillary: 214 mg/dL — ABNORMAL HIGH (ref 70–99)
Glucose-Capillary: 228 mg/dL — ABNORMAL HIGH (ref 70–99)
Glucose-Capillary: 254 mg/dL — ABNORMAL HIGH (ref 70–99)

## 2019-05-10 LAB — MAGNESIUM: Magnesium: 2.6 mg/dL — ABNORMAL HIGH (ref 1.7–2.4)

## 2019-05-10 LAB — APTT
aPTT: 84 seconds — ABNORMAL HIGH (ref 24–36)
aPTT: 83 seconds — ABNORMAL HIGH (ref 24–36)

## 2019-05-10 LAB — PHOSPHORUS: Phosphorus: 3.5 mg/dL (ref 2.5–4.6)

## 2019-05-10 LAB — FERRITIN: Ferritin: 1704 ng/mL — ABNORMAL HIGH (ref 24–336)

## 2019-05-10 LAB — C-REACTIVE PROTEIN: CRP: 14.7 mg/dL — ABNORMAL HIGH (ref <1.0)

## 2019-05-10 LAB — VANCOMYCIN, PEAK: Vancomycin Pk: 29 ug/mL — ABNORMAL LOW (ref 30–40)

## 2019-05-10 MED ORDER — MUPIROCIN 2 % EX OINT
1.0000 "application " | TOPICAL_OINTMENT | Freq: Two times a day (BID) | CUTANEOUS | Status: AC
  Administered 2019-05-10 – 2019-05-14 (×10): 1 via NASAL
  Filled 2019-05-10 (×2): qty 22

## 2019-05-10 MED ORDER — CHLORHEXIDINE GLUCONATE CLOTH 2 % EX PADS: 6.0000 | MEDICATED_PAD | Freq: Every day | CUTANEOUS | Status: DC

## 2019-05-10 MED ORDER — INSULIN ASPART 100 UNIT/ML ~~LOC~~ SOLN
0.0000 [IU] | Freq: Every day | SUBCUTANEOUS | Status: DC
  Administered 2019-05-10 – 2019-05-15 (×3): 2 [IU] via SUBCUTANEOUS
  Filled 2019-05-10 (×5): qty 1

## 2019-05-10 MED ORDER — POTASSIUM CHLORIDE CRYS ER 20 MEQ PO TBCR
40.0000 meq | EXTENDED_RELEASE_TABLET | Freq: Two times a day (BID) | ORAL | Status: AC
  Administered 2019-05-10 (×2): 40 meq via ORAL
  Filled 2019-05-10 (×2): qty 2

## 2019-05-10 MED ORDER — INSULIN ASPART 100 UNIT/ML ~~LOC~~ SOLN
4.0000 [IU] | Freq: Three times a day (TID) | SUBCUTANEOUS | Status: DC
  Administered 2019-05-10 – 2019-05-12 (×5): 4 [IU] via SUBCUTANEOUS
  Filled 2019-05-10: qty 1

## 2019-05-10 MED ORDER — INSULIN ASPART 100 UNIT/ML ~~LOC~~ SOLN
0.0000 [IU] | Freq: Three times a day (TID) | SUBCUTANEOUS | Status: DC
  Administered 2019-05-10: 8 [IU] via SUBCUTANEOUS
  Administered 2019-05-11 – 2019-05-12 (×4): 5 [IU] via SUBCUTANEOUS
  Administered 2019-05-12: 3 [IU] via SUBCUTANEOUS
  Administered 2019-05-12: 11 [IU] via SUBCUTANEOUS
  Administered 2019-05-13: 3 [IU] via SUBCUTANEOUS
  Administered 2019-05-13: 2 [IU] via SUBCUTANEOUS
  Administered 2019-05-13: 8 [IU] via SUBCUTANEOUS
  Administered 2019-05-14: 2 [IU] via SUBCUTANEOUS
  Administered 2019-05-14: 3 [IU] via SUBCUTANEOUS
  Administered 2019-05-15: 5 [IU] via SUBCUTANEOUS
  Administered 2019-05-15: 2 [IU] via SUBCUTANEOUS
  Administered 2019-05-16 (×2): 8 [IU] via SUBCUTANEOUS
  Administered 2019-05-16: 5 [IU] via SUBCUTANEOUS
  Administered 2019-05-18 (×2): 3 [IU] via SUBCUTANEOUS
  Filled 2019-05-10 (×22): qty 1

## 2019-05-10 NOTE — Progress Notes (Signed)
Inpatient Diabetes Program Recommendations  AACE/ADA: New Consensus Statement on Inpatient Glycemic Control (2015)  Target Ranges:  Prepandial:   less than 140 mg/dL      Peak postprandial:   less than 180 mg/dL (1-2 hours)      Critically ill patients:  140 - 180 mg/dL   Results for Victor Arnold, Victor A JR. Milus Banister (MRN [id number]) as of 05/10/2019 09:47  Ref. Range 05/08/2019 23:31 05/09/2019 03:32 05/09/2019 08:23 05/09/2019 12:37 05/09/2019 16:16 05/09/2019 20:39  Glucose-Capillary Latest Ref Range: 70 - 99 mg/dL 105 (H) 117 (H) 89    10 units LANTUS given at 9:16am 210 (H)  5 units NOVOLOG  330 (H)  11 units NOVOLOG  305 (H)  11 units NOVOLOG    Results for Victor Arnold, Victor A JR. Milus Banister (MRN [id number]) as of 05/10/2019 13:39  Ref. Range 05/10/2019 00:07 05/10/2019 04:38 05/10/2019 07:28 05/10/2019 11:53  Glucose-Capillary Latest Ref Range: 70 - 99 mg/dL 214 (H)  5 units NOVOLOG 112 (H) 121 (H) 211 (H)     Home DM Meds: Glipizide XL 5 mg daily (has been out of Glipizide for a few months)       Metformin 500 mg BID  Current Orders: Lantus 10 units Daily      Novolog Moderate Correction Scale/ SSI (0-15 units) Q4 hours     MD- Note Solucortef stopped yesterday--last dose given yesterday at 11:37am.  Now getting Decadron 6 mg Daily.  Getting Dysphagic 1 PO diet.  Please consider:  1. Change Novolog SSi to TID AC + HS (currently ordered Q4 hours)  2. Start low dose Novolog Meal Coverage: Novolog 4 units TID with meals (Please add the following Hold Parameters: Hold if pt eats <50% of meal, Hold if pt NPO)     --Will follow patient during hospitalization--  Wyn Quaker RN, MSN, CDE Diabetes Coordinator Inpatient Glycemic Control Team Team Pager: [phone] (8a-5p)

## 2019-05-10 NOTE — Progress Notes (Signed)
[place] for Electrolyte Monitoring and Replacement   Recent Labs: Potassium (mmol/L)  Date Value  05/10/2019 3.0 (L)   Magnesium (mg/dL)  Date Value  05/10/2019 2.6 (H)   Calcium (mg/dL)  Date Value  05/10/2019 7.8 (L)   Albumin (g/dL)  Date Value  05/10/2019 2.2 (L)   Phosphorus (mg/dL)  Date Value  05/10/2019 3.5   Sodium (mmol/L)  Date Value  05/10/2019 146 (H)   Corrected Calcium: 8.34  Assessment: 64 year old male admitted after recent diagnosis of COVID and worsening condition. Patient hypotensive requiring pressors, which have been discontinued. Patient no longer on stress dose steroids.  Goal of Therapy:  Electrolytes WNL  Plan:  Order for potassium 40 mEq PO x 2 doses this morning.  Will follow up electrolytes with morning labs and replace as indicated.  Tawnya Crook, PharmD Clinical Pharmacist 05/10/2019 11:09 AM

## 2019-05-10 NOTE — Progress Notes (Signed)
SLP Cancellation Note  Patient Details Name: Victor Arnold. MRN: [id number] DOB: 1955/07/13   Cancelled treatment:       Reason Eval/Treat Not Completed: (chart removed; consulted NSG re: pt's status). Discussed w/ NSG pt's current status. NSG reported good toleration of current dysphagia diet w/ pt eating 100% at breakfast meal this morning and no overt s/s of aspiration noted. Pt continues to require much assistance w/ tasks; remains in CCU  admitted with septic shock and acute hypoxic respiratory failure secondary to COVID-19 pneumonia and acute renal failure with metabolic/lactic acidosis.  As pt is tolerating this diet well w/ good intake in light of several medical issues, recommend continue w/ current diet next 1-2 days for safe oral intake meeting nutrition/hydration needs. ST services will f/u next 1-2 days for trials to upgrade diet when appropriate. NSG fully agreed.     Orinda Kenner, MS, CCC-SLP Jillisa Harris 05/10/2019, 2:45 PM

## 2019-05-10 NOTE — Consult Note (Signed)
[facility] for Argatroban  Indication: Possible HIT  Allergies  Allergen Reactions  . Heparin     HIT Ab pending    Patient Measurements: Height: 5\' 11"  (180.3 cm) Weight: 81.3 kg (179 lb 3.7 oz) IBW/kg (Calculated) : 75.3  Vital Signs: Temp: 98 F (36.7 C) (04/19 0800) Temp Source: Oral (04/19 0800) BP: 117/63 (04/19 0900) Pulse Rate: 92 (04/19 0900)  Labs: Recent Labs    05/08/19 0318 05/08/19 0318 05/09/19 0334 05/09/19 0812 05/09/19 1112 05/09/19 1615 05/10/19 0405 05/10/19 0752  HGB 10.7*   < > 9.9*  --   --   --  9.1*  --   HCT 30.5*  --  27.9*  --   --   --  26.9*  --   PLT 128*  --  73*  --   --   --  50*  --   APTT  --   --   --  38*   < > 70* 84* 83*  LABPROT  --   --   --  16.6*  --   --   --   --   INR  --   --   --  1.4*  --   --   --   --   CREATININE 1.27*  --  0.97  --   --   --  0.67  --    < > = values in this interval not displayed.    Estimated Creatinine Clearance: 98 mL/min (by C-G formula based on SCr of 0.67 mg/dL).   Medical History: Past Medical History:  Diagnosis Date  . Diabetes mellitus without complication ([place])   . GERD (gastroesophageal reflux disease)   . Hypertension    Assessment: Pharmacy consulted for argatroban dosing and monitoring in 64 yo male for suspected HIT. 4Ts Score 5 (Intermediate Probability of HIT ~14%).  Patient is being treated with vancomycin for MRSA bacteremia. Patient is also COVID (+), D-Dimer: [id number]. Linezolid started 4/16 PM for possible MRSA PNA. Now Dcd per Dr. Patsey Berthold.   Patient received Enoxaparin 40mg  4/17 @ 2327  PLts: 241>220>174>128>73>50   Goal of Therapy:  aPTT 50-90 seconds Monitor platelets by anticoagulation protocol: Yes   Plan:  4/19 0752 aPTT 83 therapeutic for several draws. Will continue argatroban at current rate of 0.5 mcg/kg/min.  CBC and aPTT with morning labs.  Tawnya Crook, PharmD Clinical Pharmacist 05/10/2019 9:53  AM

## 2019-05-10 NOTE — Progress Notes (Signed)
ID     Date of Admission:  05/20/2019     ID: Victor Arnold. is a 64 y.o. male  Active Problems:   COVID-19  64 y.o. male with a history of HTN, DM, GERD was in [facility] between 4/10-4/14 for abdominal pain and diagnosed with DKA/COVID infection-. He left AMA on 4/14 because son was in [place].and returned on 4/15 with worsening condition, altered mental status, and high blood sugar Temp 102.4, BP 76/54.Bld glucose 412. Found to have severe resp distress- CXR showed Patchy bibasilar airspace disease consistent with multifocal pneumonia or edema  Subjective: Of oxygen Feeling a little better  Medications:  . albuterol  2 puff Inhalation Q6H  . vitamin C  500 mg Oral Daily  . Chlorhexidine Gluconate Cloth  6 each Topical Q0600  . dexamethasone  6 mg Oral Daily  . insulin aspart  0-15 Units Subcutaneous TID WC  . insulin aspart  0-5 Units Subcutaneous QHS  . insulin aspart  4 Units Subcutaneous TID WC  . insulin glargine  10 Units Subcutaneous Daily  . midodrine  10 mg Oral TID WC  . mupirocin ointment  1 application Nasal BID  . potassium chloride  40 mEq Oral BID  . sodium chloride flush  10-40 mL Intracatheter Q12H  . zinc sulfate  220 mg Oral Daily    Objective: Vital signs in last 24 hours: Temp:  [97.8 F (36.6 C)-98.2 F (36.8 C)] 98.2 F (36.8 C) (04/19 1400) Pulse Rate:  [74-100] 79 (04/19 1800) Resp:  [11-23] 18 (04/19 1800) BP: (102-126)/(58-75) 124/72 (04/19 1800) SpO2:  [92 %-98 %] 92 % (04/19 1800) Weight:  [81.3 kg] 81.3 kg (04/19 0300)  PHYSICAL EXAM:  General: more awake, some confusion Lungs:b /l crepts present. Heart: Tachycardia Abdomen: Soft, non-tender,not distended. Bowel sounds normal. No masses Extremities:less swollen Skin: No obvious rash Lymph: Cervical, supraclavicular normal. Neurologic: cannot be assessed Neurologic: moves all limbs Lab Results Recent Labs    05/09/19 0334 05/10/19 0405  WBC 7.1 4.6  HGB 9.9* 9.1*  HCT 27.9* 26.9*   NA 142 146*  K 3.0* 3.0*  CL 108 115*  CO2 25 23  BUN 49* 43*  CREATININE 0.97 0.67   Liver Panel Recent Labs    05/09/19 0334 05/10/19 0405  PROT 6.2* 6.0*  ALBUMIN 1.7* 2.2*  AST 55* 35  ALT 29 25  ALKPHOS 183* 161*  BILITOT 1.8* 1.5*   Sedimentation Rate No results for input(s): ESRSEDRATE in the last 72 hours. C-Reactive Protein Recent Labs    05/09/19 0334 05/10/19 0405  CRP 17.5* 14.7*    Microbiology: SARS COV 2 05/01/19 05/02/2019- BC MRSA- MIC < 0.5 05/04/2019 MRSA Studies/Results: ECHo EF 45%  Assessment/Plan: MRSA bacteremia with septic shock-which has resolved on vancomycin  Very likely has MRSA pneumonia linezolid was DC after 3 doses as  his platelets had dropped and HIT was considered as well Needs TEE Repeat Blood cultures done today No hardware  Pt had a PICC placed rt arm on 04/28/2019 while bacteremic - Will have to remove it when possible- A new PICC cannot be placed until he has cleared the bacteremia  COVID illness recently diagnosed- need to get ct value. On remdisivir  Acute hypoxic respiratory failure- due to pulmonary infiltrates which could be covid pneumonia with secondary MRSA infection  AKI-  DM- poorly controlled- management as per primary team  Encephalopathy due to sepsis, improving  Chronic bladder distension seen on recent CT- pt has a  foley now  Previous Ct showed lymphadenopathy in the retroperitoneal and mesenteric chain questioning lymphoproliferative disorder- PET as OP  Discussed the management with care team

## 2019-05-10 NOTE — Progress Notes (Signed)
Name: Victor Arnold. MRN: [id number] DOB: 1956/01/05    ADMISSION DATE:  05/05/2019 INITIAL CONSULTATION DATE: 04/26/2019  REFERRING MD : Dr. Owens Shark   CHIEF COMPLAINT: Shortness of Breath   BRIEF PATIENT DESCRIPTION:  64 yo male with recent CT Abd Pelvis results on 04/10 concerning for high suspicion of low-grade lymphoproliferative disorder oncology recommended once COVID-19 pneumonia resolves pt will need PET scan and biopsy in the outpatient setting.  He was recently hospitalized and diagnosed with COVID-19 on 04/10 but left AMA on 04/14.  He is currently admitted with septic shock and acute hypoxic respiratory failure secondary to COVID-19 pneumonia and acute renal failure with metabolic/lactic acidosis requiring levophed gtt   SIGNIFICANT EVENTS/STUDIES:  04/15: Pt admitted to ICU with COVID-19 pneumonia  04/16: MRSA bacteremia, encephalopathy 04/17: Mentation clearing, no increase in oxygen requirement 04/18: Worsening thrombocytopenia, work-up for HIT, switched to argatroban, discontinued linezolid  CULTURES:  COVID-19 04/10>>positive  MRSA PCR 04/15>> positive Blood x2 04/15>> positive,  MRSA Urine 04/15>> positive, MRSA   . sodium chloride 75 mL/hr at 05/10/19 1616  . sodium chloride Stopped (05/17/2019 0641)  . argatroban 0.5 mcg/kg/min (05/10/19 1612)  . famotidine (PEPCID) IV 20 mg (05/10/19 0904)  . thiamine injection 500 mg (05/10/19 1259)  . vancomycin 1,500 mg (05/10/19 [id number])    Allergies  Allergen Reactions  . Heparin     HIT Ab pending     REVIEW OF SYSTEMS: Limited due to patient's critically ill status  SUBJECTIVE:  No distress on 2L O2 via nasal canula.  Voices no complaint today.  Still slow with responses.  VITAL SIGNS: Temp:  [97.8 F (36.6 C)-98.2 F (36.8 C)] 98.2 F (36.8 C) (04/19 1400) Pulse Rate:  [74-100] 84 (04/19 1700) Resp:  [11-30] 17 (04/19 1700) BP: (102-126)/(58-75) 126/72 (04/19 1700) SpO2:  [95 %-98 %] 96 % (04/19  1700) Weight:  [81.3 kg] 81.3 kg (04/19 0300)  PHYSICAL EXAMINATION Physical Exam  Constitutional: He appears well-developed.  HENT:  Head: Normocephalic and atraumatic.  Eyes: Pupils are equal, round, and reactive to light.  Neck: No JVD present. No tracheal deviation present. No thyromegaly present.  Cardiovascular:  No murmur heard. Pulmonary/Chest: He is in respiratory distress. He has no wheezes. He has rales.  Abdominal: He exhibits no distension.  Musculoskeletal:        General: No edema.     Cervical back: Normal range of motion.  Skin: Skin is dry.  Vitals reviewed.     Recent Labs  Lab 05/08/19 0318 05/09/19 0334 05/10/19 0405  NA 141 142 146*  K 3.2* 3.0* 3.0*  CL 104 108 115*  CO2 26 25 23   BUN 50* 49* 43*  CREATININE 1.27* 0.97 0.67  GLUCOSE 286* 113* 144*   Recent Labs  Lab 05/08/19 0318 05/09/19 0334 05/10/19 0405  HGB 10.7* 9.9* 9.1*  HCT 30.5* 27.9* 26.9*  WBC 14.5* 7.1 4.6  PLT 128* 73* 50*   No results found.  ASSESSMENT / PLAN:  Acute hypoxic respiratory failure secondary to metabolic acidosis and [id number] pneumonia Additional complicating factor of MRSA bacteremia Supplemental O2 for dyspnea and/or hypoxia  Maintain O2 sats 88% and above  Continue remdesivir last dose (4/20), decrease steroids, switch to dexamethasone, and vitamin regimen  Continue vancomycin, discontinue linezolid due to precipitous drop in platelets PRN bronchodilator therapy  Aggressive pulmonary hygiene and OOB to chair as tolerated Limited 2D echo showed no valvular vegetations will need TEE at a later time  Septic shock  secondary to COVID-19 pneumonia and MRSA bacteremia Mildly elevated troponin secondary to demand ischemia in the setting of acute respiratory failure  Hx: HTN and Hyperlipidemia  Continuous telemetry monitoring  Troponins have leveled off Off of Neo-Synephrine On midodrine Discontinue stress dose steroids  Acute renal failure  Lactic and  metabolic acidosis  Trend BMP and lactic acid Replace electrolytes as indicated  Monitor UOP Avoid nephrotoxic medications as able Trend lactic acid on downward trend  Anemia without obvious signs of bleeding  VTE px: Had been switched to Lovenox, because of thrombocytopenia assessing HIT panel Trend CBC  Monitor for s/sx of bleeding and transfuse for hgb <7  CT Abd Pelvis 04/10: concerning for high suspicion for low-grade lymphoproliferative disorder  GERD  SUP px: iv pepcid  Oncology recommending once COVID-19 pneumonia resolves pt will need PET scan and and biopsy in the outpatient setting   Type II Diabetes Mellitus Poor control CBG q4hrs  SSI  Adjusted insulin Discontinue stress dose steroids switch to single dose dexamethasone  Severe protein calorie malnutrition Severe hypoalbuminemia Taking p.o. now dysphagia diet 1 Albumin 1.7 Supplementing albumin   Acute encephalopathy likely secondary to sepsis  COVID-19/MRSA associated encephalopathy Frequent reorientation Avoid sedating medications when possible  Continues to improve  Polysubstance abuse Positive UDS for cocaine and opiates Query IV drug use, patient evasive as to form of use May explain MRSA bacteremia Counseling once able to participate  Best Practice: VTE px: Switched to argatroban due to thrombocytopenia, HIT work-up in progress SUP px: iv pepcid  Diet: Per speech dysphagia 1 diet, patient tolerating   Critical care provider statement:    Critical care time (minutes):  33   Critical care time was exclusive of:  Separately billable procedures and  treating other patients   Critical care was necessary to treat or prevent imminent or  life-threatening deterioration of the following conditions:  acute hypoxemic respiratory failure, MRSA bacteremia, multiple comorbid conditions.    Critical care was time spent personally by me on the following  activities:  Development of treatment plan with patient  or surrogate,  discussions with consultants, evaluation of patient's response to  treatment, examination of patient, obtaining history from patient or  surrogate, ordering and performing treatments and interventions, ordering  and review of laboratory studies and re-evaluation of patient's condition   I assumed direction of critical care for this patient from another  provider in my specialty: no      Ottie Glazier, M.D.  [facility]

## 2019-05-10 NOTE — Consult Note (Signed)
ANTICOAGULATION CONSULT NOTE -  Pharmacy Consult for Argatroban  Indication: Possible HIT  Allergies  Allergen Reactions  . Heparin     HIT Ab pending    Patient Measurements: Height: 5\' 11"  (180.3 cm) Weight: 74.6 kg (164 lb 7.4 oz) IBW/kg (Calculated) : 75.3  Vital Signs: Temp: 98 F (36.7 C) (04/18 2300) Temp Source: Oral (04/18 2300) BP: 124/73 (04/19 0200) Pulse Rate: 75 (04/19 0200)  Labs: Recent Labs    05/07/19 0543 05/07/19 0543 05/08/19 0318 05/09/19 0334 05/09/19 0812 05/09/19 1112 05/09/19 1340 05/09/19 1615 05/10/19 0405  HGB 10.9*   < > 10.7* 9.9*  --   --   --   --   --   HCT 29.8*  --  30.5* 27.9*  --   --   --   --   --   PLT 174  --  128* 73*  --   --   --   --   --   APTT  --   --   --   --  38*   < > 66* 70* 84*  LABPROT  --   --   --   --  16.6*  --   --   --   --   INR  --   --   --   --  1.4*  --   --   --   --   CREATININE 1.32*  --  1.27* 0.97  --   --   --   --   --    < > = values in this interval not displayed.    Estimated Creatinine Clearance: 80.1 mL/min (by C-G formula based on SCr of 0.97 mg/dL).   Medical History: Past Medical History:  Diagnosis Date  . Diabetes mellitus without complication ([place])   . GERD (gastroesophageal reflux disease)   . Hypertension    Assessment: Pharmacy consulted for argatroban dosing and monitoring in 64 yo male for suspected HIT. 4Ts Score 5 (Intermediate Probability of HIT ~14%).  Patient is being treated with vancomycin for MRSA bacteremia. Patient is also COVID (+), D-Dimer: [id number]. Linezolid started 4/16 PM for possible MRSA PNA. Now Dcd per Dr. Patsey Berthold.   Patient received Enoxaparin 40mg  4/17 @ 2327  PLts: 241>>220>> 174>>128>>73  DATE TIME APTT Argatroban Rate 4/18 0812 38 Started @ 0.5 mcg/kg/min 4/18 1112 57 Continue current rate 4/18  1340    66        Continue current rate    Goal of Therapy:  aPTT 50-90 seconds Monitor platelets by anticoagulation protocol: Yes   Plan:   4/18 @ 1340. APTT 66. Level in therapeutic x2.  Continue Argatroban @ 0.25mcg/kg/min.  Due to jump in aPTT from 57 to 66, will order one more confirmatory level in 2 hours.   Pharmacy will adjust per aPTT response every 2 hours until two therapeutic levels are obtained.   4/18: APTT @ 1615 = 70 Will continue pt on current rate and recheck aPTT on 4/19 with AM labs.   4/19 0405 aPTT = 84, therapeutic.  Continue current rate and recheck aPTT in 4 hrs  CBC and aPTT order at least once daily per protocol.   Ena Dawley, PharmD Clinical Pharmacist 05/10/2019 4:56 AM

## 2019-05-11 LAB — GLUCOSE, CAPILLARY
Glucose-Capillary: 204 mg/dL — ABNORMAL HIGH (ref 70–99)
Glucose-Capillary: 210 mg/dL — ABNORMAL HIGH (ref 70–99)
Glucose-Capillary: 221 mg/dL — ABNORMAL HIGH (ref 70–99)
Glucose-Capillary: 228 mg/dL — ABNORMAL HIGH (ref 70–99)
Glucose-Capillary: 261 mg/dL — ABNORMAL HIGH (ref 70–99)

## 2019-05-11 LAB — VANCOMYCIN, TROUGH: Vancomycin Tr: 13 ug/mL — ABNORMAL LOW (ref 15–20)

## 2019-05-11 LAB — CBC WITH DIFFERENTIAL/PLATELET
Abs Immature Granulocytes: 0.03 10*3/uL (ref 0.00–0.07)
Basophils Absolute: 0 10*3/uL (ref 0.0–0.1)
Basophils Relative: 1 %
Eosinophils Absolute: 0 10*3/uL (ref 0.0–0.5)
Eosinophils Relative: 0 %
HCT: 26.3 % — ABNORMAL LOW (ref 39.0–52.0)
Hemoglobin: 9.1 g/dL — ABNORMAL LOW (ref 13.0–17.0)
Immature Granulocytes: 1 %
Lymphocytes Relative: 10 %
Lymphs Abs: 0.5 10*3/uL — ABNORMAL LOW (ref 0.7–4.0)
MCH: 30.6 pg (ref 26.0–34.0)
MCHC: 34.6 g/dL (ref 30.0–36.0)
MCV: 88.6 fL (ref 80.0–100.0)
Monocytes Absolute: 0.1 10*3/uL (ref 0.1–1.0)
Monocytes Relative: 2 %
Neutro Abs: 4.1 10*3/uL (ref 1.7–7.7)
Neutrophils Relative %: 86 %
Platelets: 60 10*3/uL — ABNORMAL LOW (ref 150–400)
RBC: 2.97 MIL/uL — ABNORMAL LOW (ref 4.22–5.81)
RDW: 13.4 % (ref 11.5–15.5)
Smear Review: NORMAL
WBC: 4.7 10*3/uL (ref 4.0–10.5)
nRBC: 0 % (ref 0.0–0.2)

## 2019-05-11 LAB — PHOSPHORUS: Phosphorus: 3 mg/dL (ref 2.5–4.6)

## 2019-05-11 LAB — COMPREHENSIVE METABOLIC PANEL
ALT: 22 U/L (ref 0–44)
AST: 29 U/L (ref 15–41)
Albumin: 1.9 g/dL — ABNORMAL LOW (ref 3.5–5.0)
Alkaline Phosphatase: 166 U/L — ABNORMAL HIGH (ref 38–126)
Anion gap: 6 (ref 5–15)
BUN: 43 mg/dL — ABNORMAL HIGH (ref 8–23)
CO2: 23 mmol/L (ref 22–32)
Calcium: 7.7 mg/dL — ABNORMAL LOW (ref 8.9–10.3)
Chloride: 117 mmol/L — ABNORMAL HIGH (ref 98–111)
Creatinine, Ser: 0.84 mg/dL (ref 0.61–1.24)
GFR calc Af Amer: >60 mL/min (ref >60)
GFR calc non Af Amer: >60 mL/min (ref >60)
Glucose, Bld: 217 mg/dL — ABNORMAL HIGH (ref 70–99)
Potassium: 3.5 mmol/L (ref 3.5–5.1)
Sodium: 146 mmol/L — ABNORMAL HIGH (ref 135–145)
Total Bilirubin: 1.2 mg/dL (ref 0.3–1.2)
Total Protein: 5.9 g/dL — ABNORMAL LOW (ref 6.5–8.1)

## 2019-05-11 LAB — APTT: aPTT: 75 seconds — ABNORMAL HIGH (ref 24–36)

## 2019-05-11 LAB — HEPARIN INDUCED PLATELET AB (HIT ANTIBODY): Heparin Induced Plt Ab: 0.096 OD (ref 0.000–0.400)

## 2019-05-11 LAB — MAGNESIUM: Magnesium: 2.4 mg/dL (ref 1.7–2.4)

## 2019-05-11 NOTE — Progress Notes (Signed)
Pharmacy Antibiotic Note  Victor Arnold. is a 64 y.o. male admitted on 05/14/2019. Patient recently diagnosed with COVID, presented with worsening condition per family. Patient hypotensive requiring vasopressors, also started on stress dose steroids. Pharmacy has been consulted for vancomycin dosing. ID has started linezolid (now stopped due low platelets).   Blood cultures with MRSA in 4/4 bottles.  Today, 05/11/2019 - day #6 vancomycin (also on linezolid 4/17 - 4/18, but stopped d/t low thrombocytopenia)  - Repeat blood cultures ordered, No growth < 12h - WBC to WNL - SCr WNL, appears relatively stable - 4/17 TTE no evidence of endocarditis - vancomycin levels performed with 4/19 dose (on 1500mg  IV q24h @ 0942)  - vanco peak = 29 mcg/mL at 12:51  - random vanco = 13 mcg/mL at 05:18  - calculated AUC = 455 (trough = 10.5)  Plan:  Continue vancomycin to 1500 mg IV q24h as AUC is at goal and patient improving per notes  Follow repeat Blood culture result  Follow renal function  Await plan for TEE   Height: 5\' 11"  (180.3 cm) Weight: 81.3 kg (179 lb 3.7 oz) IBW/kg (Calculated) : 75.3  Temp (24hrs), Avg:98 F (36.7 C), Min:97.9 F (36.6 C), Max:98.2 F (36.8 C)  Recent Labs  Lab 04/24/2019 0306 05/15/2019 0555 04/30/2019 1055 05/21/2019 1328 05/07/19 0543 05/08/19 0318 05/09/19 0334 05/10/19 0405 05/10/19 1251 05/11/19 0518  WBC   < >  --   --   --  14.1* 14.5* 7.1 4.6  --  4.7  CREATININE   < >  --  1.49*  --  1.32* 1.27* 0.97 0.67  --  0.84  LATICACIDVEN  --  3.2* 4.3* 3.4*  --  3.7* 2.2*  --   --   --   VANCOTROUGH  --   --   --   --   --   --   --   --   --  13*  VANCOPEAK  --   --   --   --   --   --   --   --  29*  --    < > = values in this interval not displayed.    Estimated Creatinine Clearance: 93.4 mL/min (by C-G formula based on SCr of 0.84 mg/dL).    Allergies  Allergen Reactions  . Heparin     HIT Ab pending     Antimicrobials this  admission: Vancomycin 4/15 >>4/16 Ceftriaxone 4/15 >>4/16 Azithromycin 4/15 >> Linezolid 4/17>> 4/18  Dose adjustments this admission: NA  Microbiology results: 4/15 BCx: 4/4 MRSA 4/15 UCx: MRSA   4/15 MRSA PCR: positive 4/19 Bcx: NGTD  Thank you for allowing pharmacy to be a part of this patient's care.  Doreene Eland, PharmD, BCPS.   Work Cell: [phone] 05/11/2019 9:37 AM

## 2019-05-11 NOTE — Progress Notes (Signed)
[facility] for Electrolyte Monitoring and Replacement   Recent Labs: Potassium (mmol/L)  Date Value  05/11/2019 3.5   Magnesium (mg/dL)  Date Value  05/11/2019 2.4   Calcium (mg/dL)  Date Value  05/11/2019 7.7 (L)   Albumin (g/dL)  Date Value  05/11/2019 1.9 (L)   Phosphorus (mg/dL)  Date Value  05/11/2019 3.0   Sodium (mmol/L)  Date Value  05/11/2019 146 (H)   Corrected Calcium: 8.34  Assessment: 64 year old male admitted after recent diagnosis of COVID and worsening condition. Patient hypotensive requiring pressors, which have been discontinued. Patient no longer on stress dose steroids.  Goal of Therapy:  Electrolytes WNL  Plan:  Electrolytes WNL. Patient tolerating diet. Will defer replacement today.  Will follow up electrolytes with morning labs and replace as indicated.  Tawnya Crook, PharmD Clinical Pharmacist 05/11/2019 11:46 AM

## 2019-05-11 NOTE — Progress Notes (Signed)
Inpatient Diabetes Program Recommendations  AACE/ADA: New Consensus Statement on Inpatient Glycemic Control (2015)  Target Ranges:  Prepandial:   less than 140 mg/dL      Peak postprandial:   less than 180 mg/dL (1-2 hours)      Critically ill patients:  140 - 180 mg/dL   Results for Junkins, Adekunle A JR. Milus Banister (MRN [id number]) as of 05/11/2019 13:09  Ref. Range 05/11/2019 07:42 05/11/2019 11:46  Glucose-Capillary Latest Ref Range: 70 - 99 mg/dL 210 (H)  9 units NOVOLOG +  10 units LANTUS  204 (H)  9 units NOVOLOG     Home DM Meds: Glipizide XL 5 mg daily (has been out of Glipizide for a few months)                             Metformin 500 mg BID  Current Orders: Lantus 10 units Daily                            Novolog Moderate Correction Scale/ SSI (0-15 units) TID AC + HS      Novolog 4 units TID with meals     MD- Getting Decadron 6 mg Daily.  Getting Dysphagic 1 PO diet.  Please consider:  1. Increase Lantus slightly to 12 units Daily  2. Increase Novolog Meal Coverage to : Novolog 6 units TID with meals    --Will follow patient during hospitalization--  Wyn Quaker RN, MSN, CDE Diabetes Coordinator Inpatient Glycemic Control Team Team Pager: [phone] (8a-5p)

## 2019-05-11 NOTE — Consult Note (Signed)
[facility] for Argatroban  Indication: Possible HIT  Allergies  Allergen Reactions  . Heparin     HIT Ab pending    Patient Measurements: Height: 5\' 11"  (180.3 cm) Weight: 81.3 kg (179 lb 3.7 oz) IBW/kg (Calculated) : 75.3  Vital Signs: Temp: 98 F (36.7 C) (04/20 0800) Temp Source: Axillary (04/20 0800) BP: 117/68 (04/20 1100) Pulse Rate: 88 (04/20 1100)  Labs: Recent Labs     0000 05/09/19 0334 05/09/19 0812 05/09/19 1112 05/10/19 0405 05/10/19 0752 05/11/19 0518  HGB   < > 9.9*  --   --  9.1*  --  9.1*  HCT  --  27.9*  --   --  26.9*  --  26.3*  PLT  --  73*  --   --  50*  --  60*  APTT  --   --  38*   < > 84* 83* 75*  LABPROT  --   --  16.6*  --   --   --   --   INR  --   --  1.4*  --   --   --   --   CREATININE  --  0.97  --   --  0.67  --  0.84   < > = values in this interval not displayed.    Estimated Creatinine Clearance: 93.4 mL/min (by C-G formula based on SCr of 0.84 mg/dL).   Medical History: Past Medical History:  Diagnosis Date  . Diabetes mellitus without complication ([place])   . GERD (gastroesophageal reflux disease)   . Hypertension    Assessment: Pharmacy consulted for argatroban dosing and monitoring in 64 yo male for suspected HIT. 4Ts Score 5 (Intermediate Probability of HIT ~14%).  Patient is being treated with vancomycin for MRSA bacteremia. Patient is also COVID (+), D-Dimer: [id number]. Linezolid started 4/16 PM for possible MRSA PNA. Now Dcd per Dr. Patsey Berthold.   Patient received Enoxaparin 40mg  4/17 @ 2327  PLts: 241>220>174>128>73>50>60   Goal of Therapy:  aPTT 50-90 seconds Monitor platelets by anticoagulation protocol: Yes   Plan:  04/20 @ 0500 aPTT 75 seconds therapeutic. Will continue argatroban at 0.5 mcg/kg/min. Will check aPTT and CBC daily with morning labs as patient has remained therapeutic.  CBC and aPTT with morning labs.  Tawnya Crook, PharmD Clinical  Pharmacist 05/11/2019 11:47 AM

## 2019-05-11 NOTE — Progress Notes (Signed)
ID MRSA bacteremia On vanco Repeat culture no growth so far Needs TEE

## 2019-05-11 NOTE — Progress Notes (Signed)
Name: Victor Arnold. MRN: [id number] DOB: 1955-03-31    ADMISSION DATE:  05/15/2019 INITIAL CONSULTATION DATE: 05/01/2019  REFERRING MD : Dr. Owens Shark   CHIEF COMPLAINT: Shortness of Breath   BRIEF PATIENT DESCRIPTION:  64 yo male with recent CT Abd Pelvis results on 04/10 concerning for high suspicion of low-grade lymphoproliferative disorder oncology recommended once COVID-19 pneumonia resolves pt will need PET scan and biopsy in the outpatient setting.  He was recently hospitalized and diagnosed with COVID-19 on 04/10 but left AMA on 04/14.  He is currently admitted with septic shock and acute hypoxic respiratory failure secondary to COVID-19 pneumonia and acute renal failure with metabolic/lactic acidosis requiring levophed gtt   SIGNIFICANT EVENTS/STUDIES:  04/15: Pt admitted to ICU with COVID-19 pneumonia  04/16: MRSA bacteremia, encephalopathy 04/17: Mentation clearing, no increase in oxygen requirement 04/18: Worsening thrombocytopenia, work-up for HIT, switched to argatroban, discontinued linezolid  CULTURES:  COVID-19 04/10>>positive  MRSA PCR 04/15>> positive Blood x2 04/15>> positive,  MRSA Urine 04/15>> positive, MRSA   . sodium chloride 75 mL/hr at 05/10/19 1616  . sodium chloride Stopped (05/17/2019 0641)  . argatroban 0.5 mcg/kg/min (05/11/19 0834)  . famotidine (PEPCID) IV 20 mg (05/11/19 0906)  . thiamine injection 500 mg (05/10/19 1259)  . vancomycin 1,500 mg (05/11/19 1001)    Allergies  Allergen Reactions  . Heparin     HIT Ab pending     REVIEW OF SYSTEMS: Limited due to patient's critically ill status  SUBJECTIVE:  No distress on 2L O2 via nasal canula.  Voices no complaint today.  Still slow with responses.  VITAL SIGNS: Temp:  [97.9 F (36.6 C)-98.2 F (36.8 C)] 98 F (36.7 C) (04/20 0800) Pulse Rate:  [77-94] 94 (04/20 1000) Resp:  [13-21] 21 (04/20 1000) BP: (107-140)/(63-86) 121/69 (04/20 1000) SpO2:  [91 %-98 %] 93 % (04/20  1000)  PHYSICAL EXAMINATION Physical Exam  Constitutional: He appears well-developed.  HENT:  Head: Normocephalic and atraumatic.  Eyes: Pupils are equal, round, and reactive to light.  Neck: No JVD present. No tracheal deviation present. No thyromegaly present.  Cardiovascular:  No murmur heard. Pulmonary/Chest: He is in respiratory distress. He has no wheezes. He has rales.  Abdominal: He exhibits no distension.  Musculoskeletal:        General: No edema.     Cervical back: Normal range of motion.  Skin: Skin is dry.  Vitals reviewed.     Recent Labs  Lab 05/09/19 0334 05/10/19 0405 05/11/19 0518  NA 142 146* 146*  K 3.0* 3.0* 3.5  CL 108 115* 117*  CO2 25 23 23   BUN 49* 43* 43*  CREATININE 0.97 0.67 0.84  GLUCOSE 113* 144* 217*   Recent Labs  Lab 05/09/19 0334 05/10/19 0405 05/11/19 0518  HGB 9.9* 9.1* 9.1*  HCT 27.9* 26.9* 26.3*  WBC 7.1 4.6 4.7  PLT 73* 50* 60*   No results found.  ASSESSMENT / PLAN:  Acute hypoxic respiratory failure secondary to metabolic acidosis and [id number] pneumonia Additional complicating factor of MRSA bacteremia Supplemental O2 for dyspnea and/or hypoxia  Maintain O2 sats 88% and above  Continue remdesivir last dose (4/20), decrease steroids, switch to dexamethasone, and vitamin regimen  Continue vancomycin, discontinue linezolid due to precipitous drop in platelets PRN bronchodilator therapy  Aggressive pulmonary hygiene and OOB to chair as tolerated Limited 2D echo showed no valvular vegetations will need TEE at a later time -plan to remove PICC place midline due to no acess  Septic shock secondary to COVID-19 pneumonia and MRSA bacteremia Mildly elevated troponin secondary to demand ischemia in the setting of acute respiratory failure  Hx: HTN and Hyperlipidemia  Continuous telemetry monitoring  Troponins have leveled off Off of Neo-Synephrine On midodrine Discontinue stress dose steroids  Acute renal failure   Lactic and metabolic acidosis  Trend BMP and lactic acid Replace electrolytes as indicated  Monitor UOP Avoid nephrotoxic medications as able Trend lactic acid on downward trend   Possible HITS     t3 score moderate    - more likely due to sepsis and covid19     - on argatroban empirically     -platelets trending up      Anemia without obvious signs of bleeding  VTE px: Had been switched to Lovenox, because of thrombocytopenia assessing HIT panel Trend CBC  Monitor for s/sx of bleeding and transfuse for hgb <7  CT Abd Pelvis 04/10: concerning for high suspicion for low-grade lymphoproliferative disorder  GERD  SUP px: iv pepcid  Oncology recommending once COVID-19 pneumonia resolves pt will need PET scan and and biopsy in the outpatient setting   Type II Diabetes Mellitus Poor control CBG q4hrs  SSI  Adjusted insulin Discontinue stress dose steroids switch to single dose dexamethasone  Severe protein calorie malnutrition Severe hypoalbuminemia Taking p.o. now dysphagia diet 1 Albumin 1.7 Supplementing albumin   Acute encephalopathy likely secondary to sepsis  COVID-19/MRSA associated encephalopathy Frequent reorientation Avoid sedating medications when possible  Continues to improve  Polysubstance abuse Positive UDS for cocaine and opiates Query IV drug use, patient evasive as to form of use May explain MRSA bacteremia Counseling once able to participate  Best Practice: VTE px: Switched to argatroban due to thrombocytopenia, HIT work-up in progress SUP px: iv pepcid  Diet: Per speech dysphagia 1 diet, patient tolerating   Critical care provider statement:    Critical care time (minutes):  33   Critical care time was exclusive of:  Separately billable procedures and  treating other patients   Critical care was necessary to treat or prevent imminent or  life-threatening deterioration of the following conditions:  acute hypoxemic respiratory failure, MRSA  bacteremia, multiple comorbid conditions.    Critical care was time spent personally by me on the following  activities:  Development of treatment plan with patient or surrogate,  discussions with consultants, evaluation of patient's response to  treatment, examination of patient, obtaining history from patient or  surrogate, ordering and performing treatments and interventions, ordering  and review of laboratory studies and re-evaluation of patient's condition   I assumed direction of critical care for this patient from another  provider in my specialty: no      Ottie Glazier, M.D.  [facility]

## 2019-05-11 NOTE — Consult Note (Addendum)
[place] for Argatroban  Indication: Possible HIT  Allergies  Allergen Reactions  . Heparin     HIT Ab pending    Patient Measurements: Height: 5\' 11"  (180.3 cm) Weight: 81.3 kg (179 lb 3.7 oz) IBW/kg (Calculated) : 75.3  Vital Signs: Temp: 97.9 F (36.6 C) (04/20 0300) Temp Source: Oral (04/20 0300) BP: 125/75 (04/20 0300) Pulse Rate: 80 (04/20 0300)  Labs: Recent Labs    05/09/19 0334 05/09/19 0812 05/09/19 1112 05/10/19 0405 05/10/19 0752 05/11/19 0518  HGB 9.9*  --   --  9.1*  --   --   HCT 27.9*  --   --  26.9*  --   --   PLT 73*  --   --  50*  --   --   APTT  --  38*   < > 84* 83* 75*  LABPROT  --  16.6*  --   --   --   --   INR  --  1.4*  --   --   --   --   CREATININE 0.97  --   --  0.67  --  0.84   < > = values in this interval not displayed.    Estimated Creatinine Clearance: 93.4 mL/min (by C-G formula based on SCr of 0.84 mg/dL).   Medical History: Past Medical History:  Diagnosis Date  . Diabetes mellitus without complication ([place])   . GERD (gastroesophageal reflux disease)   . Hypertension    Assessment: Pharmacy consulted for argatroban dosing and monitoring in 64 yo male for suspected HIT. 4Ts Score 5 (Intermediate Probability of HIT ~14%).  Patient is being treated with vancomycin for MRSA bacteremia. Patient is also COVID (+), D-Dimer: [id number]. Linezolid started 4/16 PM for possible MRSA PNA. Now Dcd per Dr. Patsey Berthold.   Patient received Enoxaparin 40mg  4/17 @ 2327  PLts: 241>220>174>128>73>50   Goal of Therapy:  aPTT 50-90 seconds Monitor platelets by anticoagulation protocol: Yes   Plan:  04/20 @ 0500 aPTT 75 seconds therapeutic. Will continue argatroban at 0.5 mcg/kg/min and will recheck aPTT at 1100 and continue to monitor.  CBC and aPTT with morning labs.  Tobie Lords, PharmD Clinical Pharmacist 05/11/2019 6:34 AM

## 2019-05-12 DIAGNOSIS — E119 Type 2 diabetes mellitus without complications: Secondary | ICD-10-CM

## 2019-05-12 LAB — BASIC METABOLIC PANEL
Anion gap: 8 (ref 5–15)
BUN: 38 mg/dL — ABNORMAL HIGH (ref 8–23)
CO2: 22 mmol/L (ref 22–32)
Calcium: 7.6 mg/dL — ABNORMAL LOW (ref 8.9–10.3)
Chloride: 115 mmol/L — ABNORMAL HIGH (ref 98–111)
Creatinine, Ser: 0.86 mg/dL (ref 0.61–1.24)
GFR calc Af Amer: >60 mL/min (ref >60)
GFR calc non Af Amer: >60 mL/min (ref >60)
Glucose, Bld: 243 mg/dL — ABNORMAL HIGH (ref 70–99)
Potassium: 3.4 mmol/L — ABNORMAL LOW (ref 3.5–5.1)
Sodium: 145 mmol/L (ref 135–145)

## 2019-05-12 LAB — GLUCOSE, CAPILLARY
Glucose-Capillary: 215 mg/dL — ABNORMAL HIGH (ref 70–99)
Glucose-Capillary: 194 mg/dL — ABNORMAL HIGH (ref 70–99)
Glucose-Capillary: 159 mg/dL — ABNORMAL HIGH (ref 70–99)

## 2019-05-12 LAB — APTT: aPTT: 66 seconds — ABNORMAL HIGH (ref 24–36)

## 2019-05-12 LAB — MAGNESIUM: Magnesium: 2 mg/dL (ref 1.7–2.4)

## 2019-05-12 LAB — CBC
HCT: 25 % — ABNORMAL LOW (ref 39.0–52.0)
Hemoglobin: 9 g/dL — ABNORMAL LOW (ref 13.0–17.0)
MCH: 30.6 pg (ref 26.0–34.0)
MCHC: 36 g/dL (ref 30.0–36.0)
MCV: 85 fL (ref 80.0–100.0)
Platelets: 72 10*3/uL — ABNORMAL LOW (ref 150–400)
RBC: 2.94 MIL/uL — ABNORMAL LOW (ref 4.22–5.81)
RDW: 13.6 % (ref 11.5–15.5)
WBC: 5.1 10*3/uL (ref 4.0–10.5)
nRBC: 0 % (ref 0.0–0.2)

## 2019-05-12 LAB — PHOSPHORUS: Phosphorus: 3.4 mg/dL (ref 2.5–4.6)

## 2019-05-12 MED ORDER — ENOXAPARIN SODIUM 40 MG/0.4ML ~~LOC~~ SOLN
40.0000 mg | SUBCUTANEOUS | Status: DC
  Administered 2019-05-12 – 2019-05-18 (×7): 40 mg via SUBCUTANEOUS
  Filled 2019-05-12 (×7): qty 0.4

## 2019-05-12 MED ORDER — INSULIN GLARGINE 100 UNIT/ML ~~LOC~~ SOLN
12.0000 [IU] | Freq: Every day | SUBCUTANEOUS | Status: DC
  Administered 2019-05-13 – 2019-05-18 (×4): 12 [IU] via SUBCUTANEOUS
  Filled 2019-05-12 (×7): qty 0.12

## 2019-05-12 MED ORDER — POTASSIUM CHLORIDE CRYS ER 20 MEQ PO TBCR
20.0000 meq | EXTENDED_RELEASE_TABLET | ORAL | Status: AC
  Administered 2019-05-12 (×2): 20 meq via ORAL
  Filled 2019-05-12 (×2): qty 1

## 2019-05-12 MED ORDER — INSULIN ASPART 100 UNIT/ML ~~LOC~~ SOLN
6.0000 [IU] | Freq: Three times a day (TID) | SUBCUTANEOUS | Status: DC
  Administered 2019-05-12 – 2019-05-18 (×11): 6 [IU] via SUBCUTANEOUS
  Filled 2019-05-12 (×10): qty 1

## 2019-05-12 NOTE — Progress Notes (Signed)
  Speech Language Pathology Treatment: Dysphagia  Patient Details Name: Victor Arnold. MRN: [id number] DOB: March 28, 1955 Today's Date: 05/12/2019 Time: [id number] SLP Time Calculation (min) (ACUTE ONLY): 45 min  Assessment / Plan / Recommendation Clinical Impression  Pt seen today for ongoing assessment of swallowing, toleration of diet, and trials to upgrade diet consistency. Pt has been tolerating current dysphagia diet w/ no overt s/s of aspiration noted; good oral intake. Pt remains somewhat confused and easily distracted but more easily directed w/ po tasks and follow through w/ precautions w/ verbal/visual/tactile cues(min). Pt was eager to have "water".  Pt appeared to adequately tolerate trials of thin liquids and soft solids w/ no overt s/s of aspiration noted; no decline in O2 sats(96%) or respiratory presentation. Oral phase appeared [facility] for bolus mastication and management w/ appropriate oral clearing b/t trials. Education on need for aspiration precautions and NO impulsive drinking; rest breaks to calm breathing w/ challenge of po tasks/intake Recommend a Mech Soft diet w/ thin liquids; aspiration precautions; Pills in Puree for safer swallowing. Feeding Support at meals d/t UE weakness. ST services can be available for any new needs, education while admitted.     HPI HPI: Pt is a 64 y.o. male with medical history significant for type 2 diabetes, hyperlipidemia and GERD who presents with concerns of abdominal pain. Patient reports that symptoms started about 2 days ago when he felt left upper abdominal pain that felt like a "knot" in his stomach.  Also began to have nausea, vomiting as well as some diarrhea. CXR: patchy bibasilar airspace disease consistent with multifocal pneumonia or edema. MR of the Abd.: Bulky retroperitoneal adenopathy worse along the left periaortic chain, suspicious for lymphoma or metastatic disease. Pt is Positive for COVID-19. Pt is admitted with septic shock and  acute hypoxic respiratory failure secondary to COVID-19 pneumonia and acute renal failure with metabolic/lactic acidosis requiring levophed gtt.       SLP Plan  Continue with current plan of care       Recommendations  Diet recommendations: Dysphagia 3 (mechanical soft);Thin liquid Liquids provided via: Cup;Straw(monitor) Medication Administration: Whole meds with puree(for safer swallowing) Supervision: Patient able to self feed;Staff to assist with self feeding;Intermittent supervision to cue for compensatory strategies Compensations: Minimize environmental distractions;Slow rate;Small sips/bites;Lingual sweep for clearance of pocketing;Follow solids with liquid Postural Changes and/or Swallow Maneuvers: Seated upright 90 degrees;Upright 30-60 min after meal                General recommendations: (Dietician f/u) Oral Care Recommendations: Oral care BID;Oral care before and after PO;Staff/trained caregiver to provide oral care Follow up Recommendations: Skilled Nursing facility SLP Visit Diagnosis: Dysphagia, oropharyngeal phase (R13.12) Plan: Continue with current plan of care       [place], [place], CCC-SLP [place] 05/12/2019, 4:42 PM

## 2019-05-12 NOTE — Consult Note (Signed)
[place] for Argatroban  Indication: Possible HIT  Allergies  Allergen Reactions  . Heparin     HIT Ab pending    Patient Measurements: Height: 5\' 11"  (180.3 cm) Weight: 81.3 kg (179 lb 3.7 oz) IBW/kg (Calculated) : 75.3  Vital Signs: Temp: 98 F (36.7 C) (04/21 0000) Temp Source: Axillary (04/21 0000) BP: 118/67 (04/21 0500) Pulse Rate: 80 (04/21 0500)  Labs: Recent Labs    05/09/19 [id number] 05/09/19 1112 05/10/19 0405 05/10/19 0405 05/10/19 0752 05/11/19 0518 05/12/19 0416  HGB  --   --  9.1*   < >  --  9.1* 9.0*  HCT  --   --  26.9*  --   --  26.3* 25.0*  PLT  --   --  50*  --   --  60* 72*  APTT 38*   < > 84*   < > 83* 75* 66*  LABPROT 16.6*  --   --   --   --   --   --   INR 1.4*  --   --   --   --   --   --   CREATININE  --   --  0.67  --   --  0.84 0.86   < > = values in this interval not displayed.    Estimated Creatinine Clearance: 91.2 mL/min (by C-G formula based on SCr of 0.86 mg/dL).   Medical History: Past Medical History:  Diagnosis Date  . Diabetes mellitus without complication ([place])   . GERD (gastroesophageal reflux disease)   . Hypertension    Assessment: Pharmacy consulted for argatroban dosing and monitoring in 64 yo male for suspected HIT. 4Ts Score 5 (Intermediate Probability of HIT ~14%).  Patient is being treated with vancomycin for MRSA bacteremia. Patient is also COVID (+), D-Dimer: [id number]. Linezolid started 4/16 PM for possible MRSA PNA. Now Dcd per Dr. Patsey Berthold.   Patient received Enoxaparin 40mg  4/17 @ 2327  PLts: 241>220>174>128>73>50>60   Goal of Therapy:  aPTT 50-90 seconds Monitor platelets by anticoagulation protocol: Yes   Plan:  04/21 @ 0500 aPTT 66 seconds therapeutic. Will continue argatroban at 0.5 mcg/kg/min. Will check aPTT and CBC daily with morning labs as patient has remained therapeutic.  Tobie Lords, PharmD Clinical Pharmacist 05/12/2019 5:20 AM

## 2019-05-12 NOTE — Progress Notes (Signed)
[place] for Electrolyte Monitoring and Replacement   Recent Labs: Potassium (mmol/L)  Date Value  05/12/2019 3.4 (L)   Magnesium (mg/dL)  Date Value  05/12/2019 2.0   Calcium (mg/dL)  Date Value  05/12/2019 7.6 (L)   Albumin (g/dL)  Date Value  05/11/2019 1.9 (L)   Phosphorus (mg/dL)  Date Value  05/12/2019 3.4   Sodium (mmol/L)  Date Value  05/12/2019 145   Corrected Calcium: 8.34  Assessment: 64 year old male admitted after recent diagnosis of COVID and worsening condition. Patient hypotensive requiring pressors, which have been discontinued. Patient no longer on stress dose steroids.  Goal of Therapy:  Electrolytes WNL  Plan:  Potassium slightly low. Replaced this morning. Patient tolerating diet.  Will follow up electrolytes with morning labs and replace as indicated.  Tawnya Crook, PharmD Clinical Pharmacist 05/12/2019 12:09 PM

## 2019-05-12 NOTE — Consult Note (Signed)
[place] for Argatroban  Indication: Possible HIT  No Active Allergies Patient Measurements: Height: 5\' 11"  (180.3 cm) Weight: 81.3 kg (179 lb 3.7 oz) IBW/kg (Calculated) : 75.3  Vital Signs: Temp: 97.6 F (36.4 C) (04/21 0800) Temp Source: Oral (04/21 0800) BP: 109/67 (04/21 1100) Pulse Rate: 86 (04/21 1100)  Labs: Recent Labs    05/10/19 0405 05/10/19 0405 05/10/19 0752 05/11/19 0518 05/12/19 0416  HGB 9.1*   < >  --  9.1* 9.0*  HCT 26.9*  --   --  26.3* 25.0*  PLT 50*  --   --  60* 72*  APTT 84*   < > 83* 75* 66*  CREATININE 0.67  --   --  0.84 0.86   < > = values in this interval not displayed.    Estimated Creatinine Clearance: 91.2 mL/min (by C-G formula based on SCr of 0.86 mg/dL).   Medical History: Past Medical History:  Diagnosis Date  . Diabetes mellitus without complication ([place])   . GERD (gastroesophageal reflux disease)   . Hypertension    Assessment: Pharmacy consulted for argatroban dosing and monitoring in 64 yo male for suspected HIT. 4Ts Score 5 (Intermediate Probability of HIT ~14%).  Patient is being treated with vancomycin for MRSA bacteremia. Patient is also COVID (+), D-Dimer: [id number]. Linezolid started 4/16 PM for possible MRSA PNA. Now Dcd per Dr. Patsey Berthold.   Patient received Enoxaparin 40mg  4/17 @ 2327  PLts: 241>220>174>128>73>50>60   Goal of Therapy:  aPTT 50-90 seconds Monitor platelets by anticoagulation protocol: Yes   Plan:  HIT antibody negative. Low suspicion for HIT with other likely causes of drop in platelets. Platelet count now improving. Per discussion with MD, will discontinue argatroban at this time. Patient has been placed on Lovenox 40 mg daily for VTE prophylaxis. Heparin allergy has been removed from chart.  Tawnya Crook, PharmD Clinical Pharmacist 05/12/2019 12:04 PM

## 2019-05-12 NOTE — Progress Notes (Signed)
ID OUT of ICU  Awake , more alert, but some confusion persist Says he is hungry  Patient Vitals for the past 24 hrs:  BP Temp Temp src Pulse Resp SpO2  05/12/19 1958 -- -- -- -- -- 100 %  05/12/19 1605 137/71 97.9 F (36.6 C) Oral 85 (!) 22 97 %  05/12/19 1200 119/74 -- -- 88 (!) 24 91 %  05/12/19 1100 109/67 -- -- 86 20 96 %  05/12/19 1000 107/63 -- -- 90 (!) 22 93 %  05/12/19 0900 116/75 -- -- 84 (!) 0 95 %  05/12/19 0800 112/67 97.6 F (36.4 C) Oral 85 (!) 23 94 %  05/12/19 0700 110/67 -- -- 86 (!) 23 94 %  05/12/19 0600 114/67 -- -- 83 (!) 22 95 %  05/12/19 0500 118/67 -- -- 80 19 96 %  05/12/19 0400 117/74 -- -- 87 17 96 %  05/12/19 0300 123/72 -- -- 83 16 94 %  05/12/19 0200 122/72 -- -- 83 (!) 21 91 %  05/12/19 0100 128/73 -- -- 85 18 92 %  05/12/19 0000 121/71 98 F (36.7 C) Axillary 86 (!) 22 95 %  05/11/19 2300 114/73 -- -- 82 (!) 22 93 %  05/11/19 2200 120/74 -- -- 86 (!) 24 93 %  05/11/19 2100 121/71 -- -- 84 (!) 29 (!) 89 %    Chest b/l air entry- crepts Hs s1s2 r tPicc removed yesterday abd soft Swelling arms and feet  MRSA bacteremia with septic shock-off pressors, out of ICU on vancomycin  Concern for superadded MRSA pneumonia linezolid was DC after 3 doses as  his platelets had dropped and HIT ab sent Needs TEE down the road  Repeat Blood cultures 05/10/19 NG so far No hardware   COVID illness recently diagnosed- need to get ct value. On remdisivir  Acute hypoxic respiratory failure- due to pulmonary infiltrates which could be covid pneumonia with secondary MRSA infection has improved  AKI-resolved  DM-  management as per primary team  Encephalopathy due to sepsis, much improved  Chronic bladder distension seen on recent CT- pt has a foley now  Previous Ct showed lymphadenopathy in the retroperitoneal and mesenteric chain questioning lymphoproliferative disorder- PET as OP Discussed the management with care team

## 2019-05-13 ENCOUNTER — Inpatient Hospital Stay: Payer: Medicare Other

## 2019-05-13 DIAGNOSIS — R591 Generalized enlarged lymph nodes: Secondary | ICD-10-CM

## 2019-05-13 DIAGNOSIS — L8990 Pressure ulcer of unspecified site, unspecified stage: Secondary | ICD-10-CM | POA: Insufficient documentation

## 2019-05-13 LAB — CBC WITH DIFFERENTIAL/PLATELET
Abs Immature Granulocytes: 0.01 10*3/uL (ref 0.00–0.07)
Basophils Absolute: 0 10*3/uL (ref 0.0–0.1)
Basophils Relative: 1 %
Eosinophils Absolute: 0 10*3/uL (ref 0.0–0.5)
Eosinophils Relative: 1 %
HCT: 25.6 % — ABNORMAL LOW (ref 39.0–52.0)
Hemoglobin: 9 g/dL — ABNORMAL LOW (ref 13.0–17.0)
Immature Granulocytes: 0 %
Lymphocytes Relative: 9 %
Lymphs Abs: 0.4 10*3/uL — ABNORMAL LOW (ref 0.7–4.0)
MCH: 30.3 pg (ref 26.0–34.0)
MCHC: 35.2 g/dL (ref 30.0–36.0)
MCV: 86.2 fL (ref 80.0–100.0)
Monocytes Absolute: 0.1 10*3/uL (ref 0.1–1.0)
Monocytes Relative: 3 %
Neutro Abs: 3.3 10*3/uL (ref 1.7–7.7)
Neutrophils Relative %: 86 %
Platelets: 98 10*3/uL — ABNORMAL LOW (ref 150–400)
RBC: 2.97 MIL/uL — ABNORMAL LOW (ref 4.22–5.81)
RDW: 13.2 % (ref 11.5–15.5)
Smear Review: NORMAL
WBC: 3.8 10*3/uL — ABNORMAL LOW (ref 4.0–10.5)
nRBC: 0 % (ref 0.0–0.2)

## 2019-05-13 LAB — COMPREHENSIVE METABOLIC PANEL
ALT: 28 U/L (ref 0–44)
AST: 50 U/L — ABNORMAL HIGH (ref 15–41)
Albumin: 1.4 g/dL — ABNORMAL LOW (ref 3.5–5.0)
Alkaline Phosphatase: 348 U/L — ABNORMAL HIGH (ref 38–126)
Anion gap: 5 (ref 5–15)
BUN: 31 mg/dL — ABNORMAL HIGH (ref 8–23)
CO2: 21 mmol/L — ABNORMAL LOW (ref 22–32)
Calcium: 7.4 mg/dL — ABNORMAL LOW (ref 8.9–10.3)
Chloride: 113 mmol/L — ABNORMAL HIGH (ref 98–111)
Creatinine, Ser: 0.8 mg/dL (ref 0.61–1.24)
GFR calc Af Amer: >60 mL/min (ref >60)
GFR calc non Af Amer: >60 mL/min (ref >60)
Glucose, Bld: 154 mg/dL — ABNORMAL HIGH (ref 70–99)
Potassium: 3.9 mmol/L (ref 3.5–5.1)
Sodium: 139 mmol/L (ref 135–145)
Total Bilirubin: 1.4 mg/dL — ABNORMAL HIGH (ref 0.3–1.2)
Total Protein: 5.6 g/dL — ABNORMAL LOW (ref 6.5–8.1)

## 2019-05-13 LAB — POTASSIUM: Potassium: 3.8 mmol/L (ref 3.5–5.1)

## 2019-05-13 LAB — PROCALCITONIN: Procalcitonin: 1.11 ng/mL

## 2019-05-13 LAB — GLUCOSE, CAPILLARY
Glucose-Capillary: 336 mg/dL — ABNORMAL HIGH (ref 70–99)
Glucose-Capillary: 145 mg/dL — ABNORMAL HIGH (ref 70–99)
Glucose-Capillary: 184 mg/dL — ABNORMAL HIGH (ref 70–99)
Glucose-Capillary: 207 mg/dL — ABNORMAL HIGH (ref 70–99)
Glucose-Capillary: 259 mg/dL — ABNORMAL HIGH (ref 70–99)
Glucose-Capillary: 182 mg/dL — ABNORMAL HIGH (ref 70–99)
Glucose-Capillary: 136 mg/dL — ABNORMAL HIGH (ref 70–99)

## 2019-05-13 LAB — BRAIN NATRIURETIC PEPTIDE: B Natriuretic Peptide: 100 pg/mL (ref 0.0–100.0)

## 2019-05-13 LAB — FERRITIN: Ferritin: 1438 ng/mL — ABNORMAL HIGH (ref 24–336)

## 2019-05-13 LAB — C-REACTIVE PROTEIN: CRP: 13.7 mg/dL — ABNORMAL HIGH (ref <1.0)

## 2019-05-13 LAB — FIBRIN DERIVATIVES D-DIMER (ARMC ONLY): Fibrin derivatives D-dimer (ARMC): 5245.25 ng/mL (FEU) — ABNORMAL HIGH (ref 0.00–499.00)

## 2019-05-13 LAB — MAGNESIUM: Magnesium: 1.7 mg/dL (ref 1.7–2.4)

## 2019-05-13 LAB — PHOSPHORUS: Phosphorus: 3.1 mg/dL (ref 2.5–4.6)

## 2019-05-13 MED ORDER — SODIUM CHLORIDE 0.9 % IV SOLN
3.0000 g | Freq: Four times a day (QID) | INTRAVENOUS | Status: DC
  Administered 2019-05-13 – 2019-05-14 (×5): 3 g via INTRAVENOUS
  Filled 2019-05-13 (×3): qty 8
  Filled 2019-05-13: qty 3
  Filled 2019-05-13: qty 8
  Filled 2019-05-13 (×3): qty 3
  Filled 2019-05-13 (×2): qty 8

## 2019-05-13 MED ORDER — MELATONIN 5 MG PO TABS
10.0000 mg | ORAL_TABLET | Freq: Every day | ORAL | Status: DC
  Administered 2019-05-13 – 2019-05-17 (×5): 10 mg via ORAL
  Filled 2019-05-13 (×5): qty 2

## 2019-05-13 MED ORDER — FLUDROCORTISONE ACETATE 0.1 MG PO TABS
0.2000 mg | ORAL_TABLET | Freq: Two times a day (BID) | ORAL | Status: DC
  Administered 2019-05-13 – 2019-05-18 (×12): 0.2 mg via ORAL
  Filled 2019-05-13 (×13): qty 2

## 2019-05-13 MED ORDER — MAGNESIUM SULFATE 2 GM/50ML IV SOLN
2.0000 g | Freq: Once | INTRAVENOUS | Status: AC
  Administered 2019-05-13: 09:00:00 2 g via INTRAVENOUS
  Filled 2019-05-13: qty 50

## 2019-05-13 NOTE — Progress Notes (Signed)
Patient remains AOx4.  He is still dependent on assistance for feeding as his arms are still edematous in the wrist and hands.  He received vancomycin and Unasyn today.  He is still receiving 1/2 NS at 84ml/hr.  His family called to inquire about his status today and was quite upset that he was back in the ICU.  His UOP has been WDL.  His foley catheter is still in place.  He did not have a bowel movement today.  His breath sounds are diminished and he is receiving 15L HFNC w/humidifier.  Patient Vitals are WDL.

## 2019-05-13 NOTE — Progress Notes (Signed)
Date of Admission:  04/25/2019   Vancomycin from 05/13/2019       Subjective: Back to icu as he became SOB early this morning and was desaturation Pt says he is hungry  Medications:  . albuterol  2 puff Inhalation Q6H  . vitamin C  500 mg Oral Daily  . Chlorhexidine Gluconate Cloth  6 each Topical Q0600  . dexamethasone  6 mg Oral Daily  . enoxaparin (LOVENOX) injection  40 mg Subcutaneous Q24H  . insulin aspart  0-15 Units Subcutaneous TID WC  . insulin aspart  0-5 Units Subcutaneous QHS  . insulin aspart  6 Units Subcutaneous TID WC  . insulin glargine  12 Units Subcutaneous Daily  . melatonin  10 mg Oral QHS  . midodrine  10 mg Oral TID WC  . mupirocin ointment  1 application Nasal BID  . zinc sulfate  220 mg Oral Daily    Objective: Vital signs in last 24 hours: Temp:  [97.7 F (36.5 C)-100 F (37.8 C)] 98.8 F (37.1 C) (04/22 0957) Pulse Rate:  [85-114] 97 (04/22 0957) Resp:  [20-29] 29 (04/22 0957) BP: (95-137)/(52-76) 95/52 (04/22 0957) SpO2:  [91 %-100 %] 97 % (04/22 0957)  PHYSICAL EXAM:  General: Alert, cooperative, some resp distress, oriented in person, place, year, knows the president's name- answers questions appropriately- some restlessness  Head: Normocephalic, without obvious abnormality, atraumatic. Lungs: b/l air entry- few rhonchi, crepts Heart: irregular Abdomen: Soft, non-tender,not distended. Bowel sounds normal. No masses Extremities: atraumatic, no cyanosis. No edema. No clubbing Skin: No rashes or lesions. Or bruising Lymph: Cervical, supraclavicular normal. Neurologic: Grossly non-focal  Lab Results Recent Labs    05/11/19 0518 05/11/19 0518 05/12/19 0416 05/13/19 0626  WBC 4.7   < > 5.1 3.8*  HGB 9.1*   < > 9.0* 9.0*  HCT 26.3*   < > 25.0* 25.6*  NA 146*  --  145  --   K 3.5   < > 3.4* 3.8  CL 117*  --  115*  --   CO2 23  --  22  --   BUN 43*  --  38*  --   CREATININE 0.84  --  0.86  --    < > = values in this interval not  displayed.   Liver Panel Recent Labs    05/11/19 0518  PROT 5.9*  ALBUMIN 1.9*  AST 29  ALT 22  ALKPHOS 166*  BILITOT 1.2   Sedimentation Rate No results for input(s): ESRSEDRATE in the last 72 hours. C-Reactive Protein No results for input(s): CRP in the last 72 hours.  Microbiology:  Studies/Results: DG Chest Port 1 View  Result Date: 05/13/2019 CLINICAL DATA:  COVID pneumonia. Acute respiratory failure. EXAM: PORTABLE CHEST 1 VIEW COMPARISON:  One-view chest x-ray 09/05/2019 FINDINGS: Heart size is normal. Progressive diffuse interstitial and airspace disease is evident bilaterally. Small effusions are noted bilaterally. Axial skeleton is unremarkable. IMPRESSION: 1. Progressive diffuse interstitial and airspace disease bilaterally compatible with pneumonia. Superimposed edema is not excluded. 2. Small bilateral pleural effusions. Electronically Signed   By: San Morelle M.D.   On: 05/13/2019 07:34   05/13/19   05/11/2019  Assessment/Plan:  Acute hypoxic respiratory failure- worsening inflitrates b/l rt > let since this morning compared to last week Pulmonary edema VS aspiration Check procalcitonin and BNP Concern for MRSa pneumonia unasyn has been started this morning - already on vanco  MRSA bacteremia with septic shock-off pressors, out of ICU on vancomycin  Concern for superadded MRSA pneumonia linezolid was DC after 3 doses as his platelets had dropped and HIT ab sent Needs TEE down the road  Repeat Blood cultures4/19/21 NG so far No hardware   COVID illness recently diagnosed- need to get ct value. On remdisivir  Acute hypoxic respiratory failure- due to pulmonary infiltrates which could be covid pneumonia with secondary MRSA infection has improved  AKI-resolved  DM-  management as per primary team  Encephalopathy due to sepsis, much improved  Chronic bladder distension seen on recent CT- pt has a foley now  Previous Ct showed  lymphadenopathy in the retroperitoneal and mesenteric chain questioning lymphoproliferative disorder- PET as OP   Discussed the management with care team

## 2019-05-13 NOTE — Progress Notes (Signed)
Patient's oxygen requirement has been increasing. Patient has been tachypneic with SpO2 in the 80's on nasal cannula. Oxygen increased to bubble HFNC 15L with a 100% NRB mask over cannula. Current SpO2 94-96%. Will continue to monitor. Patient may require higher level of care if oxygen demands increase further.

## 2019-05-13 NOTE — Progress Notes (Signed)
[place] for Electrolyte Monitoring and Replacement   Recent Labs: Potassium (mmol/L)  Date Value  05/13/2019 3.8   Magnesium (mg/dL)  Date Value  05/13/2019 1.7   Calcium (mg/dL)  Date Value  05/12/2019 7.6 (L)   Albumin (g/dL)  Date Value  05/11/2019 1.9 (L)   Phosphorus (mg/dL)  Date Value  05/13/2019 3.1   Sodium (mmol/L)  Date Value  05/12/2019 145   Corrected Calcium: 8.34  Assessment: 64 year old male admitted after recent diagnosis of COVID and worsening condition. Patient was admitted to ICU and started on pressors. Patient has now been transferred to floor.   Goal of Therapy:  Electrolytes WNL  Plan:  4/22 Mg: 1.7. Provider has already order magnesium 2g IV replacement.   No additional replacement needed at this time.   Will follow up electrolytes with morning labs and replace as indicated.  Pernell Dupre, PharmD, BCPS Clinical Pharmacist 05/13/2019 7:51 AM

## 2019-05-13 NOTE — Progress Notes (Signed)
Pharmacy Antibiotic Note  Victor Gunion. is a 64 y.o. male admitted on 05/10/2019. Patient recently diagnosed with COVID, presented with worsening condition per family. Patient was hypotensive requiring vasopressors, also started on stress dose steroids in ICU. Pharmacy has been consulted for vancomycin dosing for positive blood cultures. ID started linezolid for possible MRSA PNA (now stopped due low platelets).   Blood cultures with MRSA in 4/4 bottles.  4/22 Pharmacy has been consulted for Unasyn dosing for aspiration PNA  Day #8 vancomycin (also on linezolid 4/17 - 4/18, but stopped d/t low thrombocytopenia)  - Repeat blood cultures ordered, No growth x2 days - WBC:3.8  - SCr WNL 4/21, appears relatively stable - 4/17 TTE no evidence of endocarditis - vancomycin levels performed with 4/19 dose (on 1500mg  IV q24h @ 0942)  - vanco peak = 29 mcg/mL at 12:51  - random vanco = 13 mcg/mL at 05:18  - calculated AUC = 455 (trough = 10.5)  Plan:  Start Unasyn 3g IV every 6 hours  Continue vancomycin to 1500 mg IV q24h as AUC is at goal and patient improving per notes  Follow repeat Blood culture result  Follow renal function  Await plan for TEE   Height: 5\' 11"  (180.3 cm) Weight: 81.3 kg (179 lb 3.7 oz) IBW/kg (Calculated) : 75.3  Temp (24hrs), Avg:98 F (36.7 C), Min:97.6 F (36.4 C), Max:98.7 F (37.1 C)  Recent Labs  Lab 05/16/2019 1055 04/23/2019 1328 05/07/19 0543 05/08/19 0318 05/08/19 0318 05/09/19 0334 05/10/19 0405 05/10/19 1251 05/11/19 0518 05/12/19 0416 05/13/19 0626  WBC  --   --    < > 14.5*   < > 7.1 4.6  --  4.7 5.1 3.8*  CREATININE 1.49*  --    < > 1.27*  --  0.97 0.67  --  0.84 0.86  --   LATICACIDVEN 4.3* 3.4*  --  3.7*  --  2.2*  --   --   --   --   --   VANCOTROUGH  --   --   --   --   --   --   --   --  13*  --   --   VANCOPEAK  --   --   --   --   --   --   --  29*  --   --   --    < > = values in this interval not displayed.    Estimated  Creatinine Clearance: 91.2 mL/min (by C-G formula based on SCr of 0.86 mg/dL).    No Active Allergies  Antimicrobials this admission: Vancomycin 4/15 >> Ceftriaxone 4/15 >>4/16 Azithromycin 4/15 >> Linezolid 4/17>> 4/18  Dose adjustments this admission: NA  Microbiology results: 4/15 BCx: 4/4 MRSA 4/15 UCx: MRSA   4/15 MRSA PCR: positive 4/19 Bcx: NGTD  Thank you for allowing pharmacy to be a part of this patient's care.  Pernell Dupre, PharmD, BCPS Clinical Pharmacist 05/13/2019 7:38 AM

## 2019-05-13 NOTE — Progress Notes (Signed)
Notified verbally by ICU Charge nurse Hiral during rounds that respiratory had communicated to her that this patient was having some distress. I called the unit to speak with the nurse caring for this patient. She communicated that the patient had been having increased work of breathing for the last hour or so and she had contacted respiratory who advised her to increase the oxygen flow by applying a NRB over the HFNC. Stanton Kidney also reported that she contacted Hassan Rowan NP for hospitalist service for assistance. She reports that she was told by Hassan Rowan that this patient was not on the hospitalist service. Stanton Kidney was concerned about the patient respiratory status and felt she needed someone to assist her in assessing his need to transfer to a higher level of care. I went to the room to assist her. Patient was laying awake in bed, breathing was fast and labored. Patient denied feeling short of breath. Sats 99%. Patient was able to answer questions without shortness of breath. Patient able to follow commands. Patient was moving good air in all lung fields. HFNC readjusted as it had come completely out of patients nares. NRB liter flow increased to 15L to allow full expansion of bag. Patient repositioned in bed. Respiratory rate confirmed at 34 breaths per minute. Stanton Kidney reports patient has been somewhat confused for her. In the interim orders were placed by ICU Np to transfer patient and order for chest xray placed.    0645-Received call from Shawn in patient placement about the transfer. Advised that ICU was aware of admission and patient should be assigned. Communicated to [facility] plan to transfer patient to ICU.  0655-ICU Charge Nurse Hiral contacted to give update on patient. She will identify bed for assignment.

## 2019-05-13 NOTE — Care Management Important Message (Signed)
Important Message  Patient Details  Name: Victor Arnold. MRN: [id number] Date of Birth: 09-Aug-1955   Medicare Important Message Given:  Yes  Pt in a [place] room will give to bedside RN to give to pt   Elease Hashimoto, LCSW 05/13/2019, 11:18 AM

## 2019-05-13 NOTE — Progress Notes (Signed)
Speech Language Pathology Treatment: Dysphagia  Patient Details Name: Victor Arnold. MRN: [id number] DOB: [birth year] Today's Date: 05/13/2019 Time: [id number] SLP Time Calculation (min) (ACUTE ONLY): 55 min  Assessment / Plan / Recommendation Clinical Impression  Pt seen today for ongoing assessment of swallowing, toleration of diet -- a reassessment of status d/t concern for aspiration last night shift when pt was noted to "drink out of the [employer]" and may have aspirated. Pt has since required increased O2 support -- currently on non-rebreather mask w/ [place](15L). Pt had appeared to tolerate the upgraded mech soft diet w/ no overt s/s of aspiration noted yesterday w/ SLP and at dinner. Pt remains somewhat confused and easily distracted but more easily directed w/ po tasks and follow through w/ precautions w/ verbal/visual/tactile cues(min). Per MD note, recent CT Abd Pelvis results on 04/10 concerning for high suspicion of low-grade lymphoproliferative disorder oncology recommended once COVID-19 pneumonia resolves pt will need PET scan and biopsy in the outpatient setting.  Due to pt's declined Pulmonary status, and SLP having to monitor removing/replacing O2 mask, no trials of thin liquids were presented. Pt appeared to adequate tolerate single ice chips, Nectar liquids via Cup/Straw(small sips, slowly) and purees/soft solids w/ no overt clinical s/s of aspiration noted -- clear vocal quality following each trial, no decline in O2 sats following each trial(99%). Rest breaks were given b/t trials to lessen any increased WOB -- noted tachypnea w/ RR in the low 30s before/during/post session. NSG reported same prior. Oral phase appeared [facility] for bolus management of trials given. Pt required feeding support. Pt swallowed all Pills Whole in puree w/ SLP, NSG.  Recommend a Dysphagia level 2 (minced foods, moistened) diet w/ Nectar consistency liquids; strict aspiration precautions and monitoring of  Pulmonary status during po intake. Pills Whole in puree. Rest Breaks for conservation of energy during meals. ST services will f/u w/ pt's status and toleration of diet next 1-3 days; potential need for objective swallowing assessment depending on pt's progress and clinical presentation. NSG/MD updated. Precautions posted in room.     HPI HPI: Pt is a 64 y.o. male with medical history significant for type 2 diabetes, hyperlipidemia and GERD who presents with concerns of abdominal pain. Patient reports that symptoms started about 2 days ago when he felt left upper abdominal pain that felt like a "knot" in his stomach.  Also began to have nausea, vomiting as well as some diarrhea. CXR: patchy bibasilar airspace disease consistent with multifocal pneumonia or edema. MR of the Abd.: Bulky retroperitoneal adenopathy worse along the left periaortic chain, suspicious for lymphoma or metastatic disease. Pt is Positive for COVID-19. Pt is admitted with septic shock and acute hypoxic respiratory failure secondary to COVID-19 pneumonia and acute renal failure with metabolic/lactic acidosis requiring levophed gtt.       SLP Plan  Continue with current plan of care(mbss tbd)       Recommendations  Diet recommendations: Dysphagia 2 (fine chop);Nectar-thick liquid Liquids provided via: Cup;Straw(monitor) Medication Administration: Whole meds with puree(for safer swallowing) Supervision: Patient able to self feed;Staff to assist with self feeding;Intermittent supervision to cue for compensatory strategies Compensations: Minimize environmental distractions;Slow rate;Small sips/bites;Lingual sweep for clearance of pocketing;Follow solids with liquid Postural Changes and/or Swallow Maneuvers: Seated upright 90 degrees;Upright 30-60 min after meal                General recommendations: (Dietician f/u) Oral Care Recommendations: Oral care BID;Oral care before and after PO;Staff/trained caregiver to provide  oral care Follow up Recommendations: Skilled Nursing facility SLP Visit Diagnosis: Dysphagia, oropharyngeal phase (R13.12) Plan: Continue with current plan of care(mbss tbd)       GO                 Orinda Kenner, [place], CCC-SLP Cybele Maule 05/13/2019, 2:37 PM

## 2019-05-13 NOTE — Progress Notes (Signed)
PT Cancellation Note  Patient Details Name: Victor Arnold. MRN: [id number] DOB: 01-25-1955   Cancelled Treatment:    Reason Eval/Treat Not Completed: Medical issues which prohibited therapy;Patient not medically ready(Chart reviewed. Most recent vitals show RR at 29, flow rate still at 15L (increased from 3L <12hr ago), SpO2 90%. Will hold evaluation until vitals more welcoming of OOB activity.)  1:26 PM, 05/13/19 Etta Grandchild, PT, DPT Physical Therapist - [facility]  [phone] ([place])   Orlovista C 05/13/2019, 1:26 PM

## 2019-05-13 NOTE — Plan of Care (Signed)
Pt started to de-sat on 3L around 5:00.  When he dropped to 83%, put him on Hi-flo at 15L. He is also tachypneic w/labored breathing and tachycardic.  He only got up to 87-88% on the Hi-flo.  Reached out to respiratory who told me to put him also on a non-rebreather over the Hi-flo cannula.  He is sating well at 98% but still tachypneic and tachycardic.  He is resting on his side. Have also reached out to the on-call NP - don't know if he needs a higher level of care.  Transferred from ICU yesterday afternoon after being treated for septic shock.  Concerned that he's aspirated.

## 2019-05-13 NOTE — Progress Notes (Signed)
Name: Victor Arnold. MRN: [id number] DOB: May 14, 1955    ADMISSION DATE:  05/08/2019 INITIAL CONSULTATION DATE: 04/25/2019  REFERRING MD : Dr. Owens Shark   CHIEF COMPLAINT: Shortness of Breath   BRIEF PATIENT DESCRIPTION:  64 yo male with recent CT Abd Pelvis results on 04/10 concerning for high suspicion of low-grade lymphoproliferative disorder oncology recommended once COVID-19 pneumonia resolves pt will need PET scan and biopsy in the outpatient setting.  He was recently hospitalized and diagnosed with COVID-19 on 04/10 but left AMA on 04/14.  He is currently admitted with septic shock and acute hypoxic respiratory failure secondary to COVID-19 pneumonia and acute renal failure with metabolic/lactic acidosis requiring levophed gtt   SIGNIFICANT EVENTS/STUDIES:  04/15: Pt admitted to ICU with COVID-19 pneumonia  04/16: MRSA bacteremia, encephalopathy 04/17: [place] clearing, no increase in oxygen requirement 04/18: Worsening thrombocytopenia, work-up for HIT, switched to argatroban, discontinued linezolid  CULTURES:  COVID-19 04/10>>positive  MRSA PCR 04/15>> positive Blood x2 04/15>> positive,  MRSA Urine 04/15>> positive, MRSA   . sodium chloride Stopped (05/13/19 1232)  . sodium chloride Stopped (05/13/2019 0641)  . ampicillin-sulbactam (UNASYN) IV Stopped (05/13/19 1217)  . famotidine (PEPCID) IV Stopped (05/13/19 0932)  . vancomycin 150 mL/hr at 05/13/19 1300    No Active Allergies  REVIEW OF SYSTEMS: Limited due to patient's critically ill status  SUBJECTIVE:  No distress on 2L O2 via nasal canula.  Voices no complaint today.  Still slow with responses.  VITAL SIGNS: Temp:  [97.7 F (36.5 C)-100 F (37.8 C)] 97.9 F (36.6 C) (04/22 1145) Pulse Rate:  [85-114] 95 (04/22 1300) Resp:  [20-32] 29 (04/22 1300) BP: (95-137)/(52-76) 126/64 (04/22 1300) SpO2:  [88 %-100 %] 90 % (04/22 1300)  PHYSICAL EXAMINATION Physical Exam  Constitutional: He appears  well-developed.  HENT:  Head: Normocephalic and atraumatic.  Eyes: Pupils are equal, round, and reactive to light.  Neck: No JVD present. No tracheal deviation present. No thyromegaly present.  Cardiovascular:  No murmur heard. Pulmonary/Chest: He is in respiratory distress. He has no wheezes. He has rales.  Abdominal: He exhibits no distension.  Musculoskeletal:        General: No edema.     Cervical back: Normal range of motion.  Skin: Skin is dry.  Vitals reviewed.     Recent Labs  Lab 05/11/19 0518 05/12/19 0416 05/13/19 0626  NA 146* 145 139  K 3.5 3.4* 3.9  3.8  CL 117* 115* 113*  CO2 23 22 21*  BUN 43* 38* 31*  CREATININE 0.84 0.86 0.80  GLUCOSE 217* 243* 154*   Recent Labs  Lab 05/11/19 0518 05/12/19 0416 05/13/19 0626  HGB 9.1* 9.0* 9.0*  HCT 26.3* 25.0* 25.6*  WBC 4.7 5.1 3.8*  PLT 60* 72* 98*   DG Chest Port 1 View  Result Date: 05/13/2019 CLINICAL DATA:  COVID pneumonia. Acute respiratory failure. EXAM: PORTABLE CHEST 1 VIEW COMPARISON:  One-view chest x-ray 09/05/2019 FINDINGS: Heart size is normal. Progressive diffuse interstitial and airspace disease is evident bilaterally. Small effusions are noted bilaterally. Axial skeleton is unremarkable. IMPRESSION: 1. Progressive diffuse interstitial and airspace disease bilaterally compatible with pneumonia. Superimposed edema is not excluded. 2. Small bilateral pleural effusions. Electronically Signed   By: San Morelle M.D.   On: 05/13/2019 07:34    ASSESSMENT / PLAN:  Acute hypoxic respiratory failure secondary to metabolic acidosis and [id number] pneumonia Additional complicating factor of MRSA bacteremia Supplemental O2 for dyspnea and/or hypoxia  Maintain O2 sats 88%  and above  Continue remdesivir last dose (4/20), decrease steroids, switch to dexamethasone, and vitamin regimen  Continue vancomycin, discontinue linezolid due to precipitous drop in platelets PRN bronchodilator therapy   Aggressive pulmonary hygiene and OOB to chair as tolerated Limited 2D echo showed no valvular vegetations will need TEE at a later time -I had lengthy conversation with brother of patient today and had discussed entire case and current plan with him as well as answered questions.   Septic shock secondary to COVID-19 pneumonia and MRSA bacteremia Mildly elevated troponin secondary to demand ischemia in the setting of acute respiratory failure  Hx: HTN and Hyperlipidemia  Continuous telemetry monitoring  Troponins have leveled off Off of Neo-Synephrine On midodrine Discontinue stress dose steroids   Acute renal failure  Lactic and metabolic acidosis  Trend BMP and lactic acid Replace electrolytes as indicated  Monitor UOP Avoid nephrotoxic medications as able Trend lactic acid on downward trend   Possible HITS     t3 score moderate    - more likely due to sepsis and covid19     - on argatroban empirically     -platelets trending up      Anemia without obvious signs of bleeding  VTE px: Had been switched to Lovenox, because of thrombocytopenia assessing HIT panel Trend CBC  Monitor for s/sx of bleeding and transfuse for hgb <7  CT Abd Pelvis 04/10: concerning for high suspicion for low-grade lymphoproliferative disorder  GERD  SUP px: iv pepcid  Oncology recommending once COVID-19 pneumonia resolves pt will need PET scan and and biopsy in the outpatient setting   Type II Diabetes Mellitus Poor control CBG q4hrs  SSI  Adjusted insulin Discontinue stress dose steroids switch to single dose dexamethasone  Severe protein calorie malnutrition Severe hypoalbuminemia Taking p.o. now dysphagia diet 1 Albumin 1.7 Supplementing albumin   Acute encephalopathy likely secondary to sepsis  COVID-19/MRSA associated encephalopathy Frequent reorientation Avoid sedating medications when possible  Continues to improve  Polysubstance abuse Positive UDS for cocaine and  opiates Query IV drug use, patient evasive as to form of use May explain MRSA bacteremia Counseling once able to participate  Best Practice: VTE px: Switched to argatroban due to thrombocytopenia, HIT work-up in progress SUP px: iv pepcid  Diet: Per speech dysphagia 1 diet, patient tolerating   Critical care provider statement:    Critical care time (minutes):  33   Critical care time was exclusive of:  Separately billable procedures and  treating other patients   Critical care was necessary to treat or prevent imminent or  life-threatening deterioration of the following conditions:  acute hypoxemic respiratory failure, MRSA bacteremia, multiple comorbid conditions.    Critical care was time spent personally by me on the following  activities:  Development of treatment plan with patient or surrogate,  discussions with consultants, evaluation of patient's response to  treatment, examination of patient, obtaining history from patient or  surrogate, ordering and performing treatments and interventions, ordering  and review of laboratory studies and re-evaluation of patient's condition   I assumed direction of critical care for this patient from another  provider in my specialty: no      Ottie Glazier, M.D.  Pulmonary & [place]

## 2019-05-13 NOTE — Progress Notes (Addendum)
   05/13/19 0655  Vitals  Temp 98.7 F (37.1 C)  Temp Source Oral  BP 98/61  BP Location Left Arm  BP Method Automatic  Patient Position (if appropriate) Lying  Pulse Rate 94  Pulse Rate Source Dinamap  ECG Heart Rate (!) 113  Resp (!) 23  Oxygen Therapy  SpO2 99 %  O2 Device Non-rebreather Mask;HFNC  O2 Flow Rate (L/min) 15 L/min  MEWS Score  MEWS Temp 0  MEWS Systolic 1  MEWS Pulse 2  MEWS RR 1  MEWS LOC 0  MEWS Score 4  MEWS Score Color Red  Sharion Settler communicated with the ICU NP who put in lab orders and chest xray and order to transfer.  AC also came to evaluate patient.  Will hold transfer until results of chest xray are in. Pt states he's not struggling to breath but AC agreed that breathing looks labored. BP is also now a little soft.

## 2019-05-14 ENCOUNTER — Inpatient Hospital Stay: Payer: Medicare Other

## 2019-05-14 DIAGNOSIS — G9341 Metabolic encephalopathy: Secondary | ICD-10-CM

## 2019-05-14 DIAGNOSIS — R918 Other nonspecific abnormal finding of lung field: Secondary | ICD-10-CM

## 2019-05-14 LAB — SEROTONIN RELEASE ASSAY (SRA)
SRA .2 IU/mL UFH Ser-aCnc: <1 % (ref 0–20)
SRA 100IU/mL UFH Ser-aCnc: <1 % (ref 0–20)

## 2019-05-14 LAB — COMPREHENSIVE METABOLIC PANEL
ALT: 24 U/L (ref 0–44)
AST: 36 U/L (ref 15–41)
Albumin: 1.3 g/dL — ABNORMAL LOW (ref 3.5–5.0)
Alkaline Phosphatase: 263 U/L — ABNORMAL HIGH (ref 38–126)
Anion gap: 7 (ref 5–15)
BUN: 27 mg/dL — ABNORMAL HIGH (ref 8–23)
CO2: 21 mmol/L — ABNORMAL LOW (ref 22–32)
Calcium: 7.3 mg/dL — ABNORMAL LOW (ref 8.9–10.3)
Chloride: 111 mmol/L (ref 98–111)
Creatinine, Ser: 0.76 mg/dL (ref 0.61–1.24)
GFR calc Af Amer: >60 mL/min (ref >60)
GFR calc non Af Amer: >60 mL/min (ref >60)
Glucose, Bld: 120 mg/dL — ABNORMAL HIGH (ref 70–99)
Potassium: 3.4 mmol/L — ABNORMAL LOW (ref 3.5–5.1)
Sodium: 139 mmol/L (ref 135–145)
Total Bilirubin: 1.5 mg/dL — ABNORMAL HIGH (ref 0.3–1.2)
Total Protein: 5.6 g/dL — ABNORMAL LOW (ref 6.5–8.1)

## 2019-05-14 LAB — BLOOD GAS, ARTERIAL
Acid-base deficit: 2.1 mmol/L — ABNORMAL HIGH (ref 0.0–2.0)
Bicarbonate: 21.1 mmol/L (ref 20.0–28.0)
Delivery systems: POSITIVE
FIO2: 60
O2 Saturation: 94.9 %
Patient temperature: 37
pCO2 arterial: 31 mmHg — ABNORMAL LOW (ref 32.0–48.0)
pH, Arterial: 7.44 (ref 7.350–7.450)
pO2, Arterial: 72 mmHg — ABNORMAL LOW (ref 83.0–108.0)

## 2019-05-14 LAB — CBC WITH DIFFERENTIAL/PLATELET
Abs Immature Granulocytes: 0.04 10*3/uL (ref 0.00–0.07)
Basophils Absolute: 0 10*3/uL (ref 0.0–0.1)
Basophils Relative: 1 %
Eosinophils Absolute: 0 10*3/uL (ref 0.0–0.5)
Eosinophils Relative: 0 %
HCT: 24 % — ABNORMAL LOW (ref 39.0–52.0)
Hemoglobin: 8.3 g/dL — ABNORMAL LOW (ref 13.0–17.0)
Immature Granulocytes: 1 %
Lymphocytes Relative: 8 %
Lymphs Abs: 0.3 10*3/uL — ABNORMAL LOW (ref 0.7–4.0)
MCH: 30.2 pg (ref 26.0–34.0)
MCHC: 34.6 g/dL (ref 30.0–36.0)
MCV: 87.3 fL (ref 80.0–100.0)
Monocytes Absolute: 0.1 10*3/uL (ref 0.1–1.0)
Monocytes Relative: 2 %
Neutro Abs: 3.4 10*3/uL (ref 1.7–7.7)
Neutrophils Relative %: 88 %
Platelets: 106 10*3/uL — ABNORMAL LOW (ref 150–400)
RBC: 2.75 MIL/uL — ABNORMAL LOW (ref 4.22–5.81)
RDW: 13.5 % (ref 11.5–15.5)
Smear Review: DECREASED
WBC: 3.9 10*3/uL — ABNORMAL LOW (ref 4.0–10.5)
nRBC: 0 % (ref 0.0–0.2)

## 2019-05-14 LAB — PHOSPHORUS: Phosphorus: 3.7 mg/dL (ref 2.5–4.6)

## 2019-05-14 LAB — GLUCOSE, CAPILLARY
Glucose-Capillary: 103 mg/dL — ABNORMAL HIGH (ref 70–99)
Glucose-Capillary: 184 mg/dL — ABNORMAL HIGH (ref 70–99)
Glucose-Capillary: 149 mg/dL — ABNORMAL HIGH (ref 70–99)
Glucose-Capillary: 53 mg/dL — ABNORMAL LOW (ref 70–99)
Glucose-Capillary: 83 mg/dL (ref 70–99)

## 2019-05-14 LAB — MAGNESIUM: Magnesium: 1.8 mg/dL (ref 1.7–2.4)

## 2019-05-14 MED ORDER — SODIUM CHLORIDE 0.9 % IV SOLN
25.0000 ug/min | INTRAVENOUS | Status: DC
  Administered 2019-05-14: 25 ug/min via INTRAVENOUS
  Filled 2019-05-14: qty 10

## 2019-05-14 MED ORDER — FUROSEMIDE 10 MG/ML IJ SOLN: 20.0000 mg | INTRAMUSCULAR | Status: DC

## 2019-05-14 MED ORDER — DEXTROSE 50 % IV SOLN
INTRAVENOUS | Status: AC
  Administered 2019-05-14: 50 mL
  Filled 2019-05-14: qty 50

## 2019-05-14 MED ORDER — IPRATROPIUM-ALBUTEROL 0.5-2.5 (3) MG/3ML IN SOLN
RESPIRATORY_TRACT | Status: AC
  Filled 2019-05-14: qty 3

## 2019-05-14 MED ORDER — SODIUM CHLORIDE 0.9 % IV SOLN
250.0000 mL | INTRAVENOUS | Status: DC
  Administered 2019-05-14: 250 mL via INTRAVENOUS

## 2019-05-14 MED ORDER — ATROPINE SULFATE 1 MG/10ML IJ SOSY
PREFILLED_SYRINGE | INTRAMUSCULAR | Status: AC
  Filled 2019-05-14: qty 10

## 2019-05-14 MED ORDER — POTASSIUM CHLORIDE 20 MEQ PO PACK
40.0000 meq | PACK | Freq: Once | ORAL | Status: AC
  Administered 2019-05-14: 40 meq via ORAL
  Filled 2019-05-14: qty 2

## 2019-05-14 MED ORDER — HALOPERIDOL LACTATE 5 MG/ML IJ SOLN
INTRAMUSCULAR | Status: AC
  Administered 2019-05-14: 2 mg
  Filled 2019-05-14: qty 1

## 2019-05-14 MED ORDER — FENTANYL CITRATE (PF) 100 MCG/2ML IJ SOLN
INTRAMUSCULAR | Status: AC
  Filled 2019-05-14: qty 2

## 2019-05-14 MED ORDER — FAMOTIDINE 20 MG PO TABS
20.0000 mg | ORAL_TABLET | Freq: Two times a day (BID) | ORAL | Status: DC
  Administered 2019-05-14 – 2019-05-18 (×9): 20 mg via ORAL
  Filled 2019-05-14 (×10): qty 1

## 2019-05-14 MED ORDER — IPRATROPIUM-ALBUTEROL 0.5-2.5 (3) MG/3ML IN SOLN
3.0000 mL | Freq: Four times a day (QID) | RESPIRATORY_TRACT | Status: DC | PRN
  Administered 2019-05-14: 3 mL via RESPIRATORY_TRACT

## 2019-05-14 MED ORDER — MORPHINE SULFATE (PF) 2 MG/ML IV SOLN
2.0000 mg | Freq: Once | INTRAVENOUS | Status: AC
  Administered 2019-05-14: 2 mg via INTRAVENOUS

## 2019-05-14 NOTE — Progress Notes (Signed)
Patient O2 noted in the 60s. Nasal Canula noted out of the patient nose, but still on his face. Patient noted lethargic. Nasal Cannula placed back in patient nose & NRB @ 15L placed on patient. Patient O2 slowly increased and patient noted to become more responsive. Hinton Dyer, NP & RT notified. Bipap placed on patient without incident. Patient educated on the importance of leaving his oxygen on to avoid from being intubated. Patient stated that he understand.

## 2019-05-14 NOTE — Progress Notes (Signed)
Date of Admission:  04/22/2019   Vancomycin from 04/22/2019       Subjective: Sob On Bipap Events of the day noted- spoke to his nurse He had pulled off the oxygen and his stas dropped precipitously He is on Bipap  Medications:  . albuterol  2 puff Inhalation Q6H  . vitamin C  500 mg Oral Daily  . atropine      . Chlorhexidine Gluconate Cloth  6 each Topical Q0600  . dexamethasone  6 mg Oral Daily  . enoxaparin (LOVENOX) injection  40 mg Subcutaneous Q24H  . famotidine  20 mg Oral BID  . fludrocortisone  0.2 mg Oral BID  . insulin aspart  0-15 Units Subcutaneous TID WC  . insulin aspart  0-5 Units Subcutaneous QHS  . insulin aspart  6 Units Subcutaneous TID WC  . insulin glargine  12 Units Subcutaneous Daily  . melatonin  10 mg Oral QHS  . midodrine  10 mg Oral TID WC  . mupirocin ointment  1 application Nasal BID  . zinc sulfate  220 mg Oral Daily    Objective: Vital signs in last 24 hours: Temp:  [96.7 F (35.9 C)-98.3 F (36.8 C)] 96.7 F (35.9 C) (04/23 1546) Pulse Rate:  [38-106] 88 (04/23 1600) Resp:  [5-30] 27 (04/23 1600) BP: (53-141)/(41-70) 141/70 (04/23 1600) SpO2:  [39 %-100 %] 96 % (04/23 1600) FiO2 (%):  [60 %-100 %] 60 % (04/23 1600)  PHYSICAL EXAM:  General: On BIPAP restless Lungs: b/l air entry- few rhonchi, crepts Heart: irregular Abdomen: Soft, non-tender,not distended. Bowel sounds normal. No masses foley catheter Extremities: swelling of extremities Neurologic: moves all limbs  LDA- foley catheter  Lab Results Recent Labs    05/13/19 0626 05/14/19 0606  WBC 3.8* 3.9*  HGB 9.0* 8.3*  HCT 25.6* 24.0*  NA 139 139  K 3.9  3.8 3.4*  CL 113* 111  CO2 21* 21*  BUN 31* 27*  CREATININE 0.80 0.76   Liver Panel Recent Labs    05/13/19 0626 05/14/19 0606  PROT 5.6* 5.6*  ALBUMIN 1.4* 1.3*  AST 50* 36  ALT 28 24  ALKPHOS 348* 263*  BILITOT 1.4* 1.5*   Sedimentation Rate No results for input(s): ESRSEDRATE in the last 72  hours. C-Reactive Protein Recent Labs    05/13/19 0626  CRP 13.7*    Microbiology:  Studies/Results: DG Chest Port 1 View  Result Date: 05/14/2019 CLINICAL DATA:  Acute respiratory failure with hypoxia. COVID positive 05/01/2019 EXAM: PORTABLE CHEST 1 VIEW COMPARISON:  Radiograph yesterday, additional priors. FINDINGS: Overlying monitoring devices partially obscure evaluation. Heterogeneous bilateral lung opacities are not significantly changed. Unchanged heart size and mediastinal contours. Possible small pleural effusions. No pneumothorax. IMPRESSION: Bilateral lung opacities consistent with COVID pneumonia, extensive and unchanged from yesterday. Electronically Signed   By: Keith Rake M.D.   On: 05/14/2019 03:09   DG Chest Port 1 View  Result Date: 05/13/2019 CLINICAL DATA:  COVID pneumonia. Acute respiratory failure. EXAM: PORTABLE CHEST 1 VIEW COMPARISON:  One-view chest x-ray 09/05/2019 FINDINGS: Heart size is normal. Progressive diffuse interstitial and airspace disease is evident bilaterally. Small effusions are noted bilaterally. Axial skeleton is unremarkable. IMPRESSION: 1. Progressive diffuse interstitial and airspace disease bilaterally compatible with pneumonia. Superimposed edema is not excluded. 2. Small bilateral pleural effusions. Electronically Signed   By: San Morelle M.D.   On: 05/13/2019 07:34    05/14/19  05/13/19   05/10/2019  Assessment/Plan:  Acute hypoxic respiratory failure-  worsening inflitrates b/l rt > left  Pulmonary edema VS aspiration procalcitonin and BNP near normal- so this is likely COVID related pneumonia Possible superadded MRSA pneumonia DC unasyn Pt on vancomycin  MRSA bacteremia with septic shock-off pressors,  on vancomycin  Concern for superadded MRSA pneumonia linezolid was DC after 3 doses as his platelets had dropped and HIT ab geative Needs TEE down the road  Repeat Blood cultures4/19/21 NG so far No  hardware  Urine tox positive for cocaine/opiates- the former could have put him at risk for severe covid pneumonia   Acute hypoxic respiratory failure- due to pulmonary infiltrates which could be covid pneumonia with secondary MRSA infection has improved  AKI-resolved  DM-  management as per primary team  Encephalopathy due to sepsis, much improved  Chronic bladder distension seen on recent CT- pt has a foley now  Previous Ct showed lymphadenopathy in the retroperitoneal and mesenteric chain questioning lymphoproliferative disorder- PET as OP   Discussed the management with care team

## 2019-05-14 NOTE — Progress Notes (Addendum)
PT Cancellation Note  Patient Details Name: Victor Arnold. MRN: [id number] DOB: 1955/08/24   Cancelled Treatment:    Reason Eval/Treat Not Completed: Other (comment).  Consult received and chart reviewed. Overnight, pt transitioned to bipap due to low respiratory sats. Currently on bipap. Will hold at this time for exertional mobility assessment and re-attempt later today if pt able to be weaned off bipap.  Addendum: [id number]- Pt continues to be on bipap due to respiratory issues. Will hold exertional activity at this time. Re-attempt next date if appropriate.   Yajaira Doffing 05/14/2019, 8:37 AM  Greggory Stallion, PT, DPT [phone]

## 2019-05-14 NOTE — Progress Notes (Signed)
SLP Cancellation Note  Patient Details Name: Victor Arnold. MRN: [id number] DOB: [birth year]   Cancelled treatment:       Reason Eval/Treat Not Completed: Medical issues which prohibited therapy;Patient not medically ready(chart reviewed; consulted NSG. Met at door.). Pt is currently on BiPAP for increased O2 support; noted NSG notes re: declined O2 sats w/out O2 support. NSG reported good toleration of po's at breakfast meal this morning w/ no overt s/s of aspiration noted during feeding.  SLP modified the diet consistency(foods) a little more in order for conservation of energy. Will monitor his toleration of a Pureed consistencies along w/ the Nectar liquids for readiness to upgrade diet consistency over next 1-3 days. NSG agreed.     Orinda Kenner, MS, CCC-SLP [place] Benassi 05/14/2019, 2:55 PM

## 2019-05-14 NOTE — Progress Notes (Signed)
Around 11:30 (see vital sign flowsheet for exact time), patient pulled off oxygen while he was napping. Unsure if intentional or unintentional. Oxygen saturations dropped rapidly. While RN was gearing up to enter isolation room, patient's oxygen saturations continued dropping lower from 70s and patient's breathing went from gasping to agonal. RN initially placed back on HFNC and was prepping patient to go back on bipap, but patient's agonal breathing prompting switching to bagging patient with RT who was entering room. Heart rate and oxygen saturations dropped into 30s but recovered within a few minutes to 90s while being bagged. Patient put back on bipap and within 30 minutes back to being alert. Patient never lost pulse during incident.

## 2019-05-14 NOTE — Progress Notes (Signed)
[place] for Electrolyte Monitoring and Replacement   Recent Labs: Potassium (mmol/L)  Date Value  05/14/2019 3.4 (L)   Magnesium (mg/dL)  Date Value  05/14/2019 1.8   Calcium (mg/dL)  Date Value  05/14/2019 7.3 (L)   Albumin (g/dL)  Date Value  05/14/2019 1.3 (L)   Phosphorus (mg/dL)  Date Value  05/14/2019 3.7   Sodium (mmol/L)  Date Value  05/14/2019 139   Corrected Calcium: 8.34  Assessment: 64 year old male admitted after recent diagnosis of COVID and worsening condition. Patient was admitted to ICU and started on pressors. Patient has now been transferred to floor.   Goal of Therapy:  Electrolytes WNL  Plan:  Potassium 40 mEq PO x 1.  Will follow up electrolytes with morning labs and replace as indicated.  Tawnya Crook, PharmD Clinical Pharmacist 05/14/2019 2:13 PM

## 2019-05-14 NOTE — Progress Notes (Signed)
Name: Victor Arnold. MRN: [id number] DOB: 08/10/1955    ADMISSION DATE:  05/17/2019 INITIAL CONSULTATION DATE: 05/05/2019  REFERRING MD : Dr. Owens Shark   CHIEF COMPLAINT: Shortness of Breath   BRIEF PATIENT DESCRIPTION:  64 yo male with recent CT Abd Pelvis results on 04/10 concerning for high suspicion of low-grade lymphoproliferative disorder oncology recommended once COVID-19 pneumonia resolves pt will need PET scan and biopsy in the outpatient setting.  He was recently hospitalized and diagnosed with COVID-19 on 04/10 but left AMA on 04/14.  He is currently admitted with septic shock and acute hypoxic respiratory failure secondary to COVID-19 pneumonia and acute renal failure with metabolic/lactic acidosis requiring levophed gtt   SIGNIFICANT EVENTS/STUDIES:  04/15: Pt admitted to ICU with COVID-19 pneumonia  04/16: MRSA bacteremia, encephalopathy 04/17: Mentation clearing, no increase in oxygen requirement 04/18: Worsening thrombocytopenia, work-up for HIT, switched to argatroban, discontinued linezolid 4/23- patient states he is feeling better, wanted me to help re-arrange bed for comfort during exam otherwise no complaints today  CULTURES:  COVID-19 04/10>>positive  MRSA PCR 04/15>> positive Blood x2 04/15>> positive,  MRSA Urine 04/15>> positive, MRSA   . sodium chloride Stopped (05/18/2019 0641)  . sodium chloride Stopped (05/14/19 0947)  . phenylephrine (NEO-SYNEPHRINE) Adult infusion Stopped (05/14/19 0834)  . vancomycin Stopped (05/14/19 1231)    No Active Allergies  REVIEW OF SYSTEMS: Limited due to patient's critically ill status  SUBJECTIVE:  No distress on 2L O2 via nasal canula.  Voices no complaint today.  Still slow with responses.  VITAL SIGNS: Temp:  [96.7 F (35.9 C)-98.3 F (36.8 C)] 96.7 F (35.9 C) (04/23 1546) Pulse Rate:  [38-106] 88 (04/23 1600) Resp:  [5-30] 27 (04/23 1600) BP: (53-141)/(41-70) 141/70 (04/23 1600) SpO2:  [39 %-100 %] 96 %  (04/23 1600) FiO2 (%):  [60 %-100 %] 60 % (04/23 1600)  PHYSICAL EXAMINATION Physical Exam  Constitutional: He appears well-developed.  HENT:  Head: Normocephalic and atraumatic.  Eyes: Pupils are equal, round, and reactive to light.  Neck: No JVD present. No tracheal deviation present. No thyromegaly present.  Cardiovascular:  No murmur heard. Pulmonary/Chest: No respiratory distress. He has no wheezes. He has rales.  Abdominal: He exhibits no distension.  Musculoskeletal:        General: No edema.     Cervical back: Normal range of motion.  Skin: Skin is dry.  Vitals reviewed.     Recent Labs  Lab 05/12/19 0416 05/13/19 0626 05/14/19 0606  NA 145 139 139  K 3.4* 3.9  3.8 3.4*  CL 115* 113* 111  CO2 22 21* 21*  BUN 38* 31* 27*  CREATININE 0.86 0.80 0.76  GLUCOSE 243* 154* 120*   Recent Labs  Lab 05/12/19 0416 05/13/19 0626 05/14/19 0606  HGB 9.0* 9.0* 8.3*  HCT 25.0* 25.6* 24.0*  WBC 5.1 3.8* 3.9*  PLT 72* 98* 106*   DG Chest Port 1 View  Result Date: 05/14/2019 CLINICAL DATA:  Acute respiratory failure with hypoxia. COVID positive 05/01/2019 EXAM: PORTABLE CHEST 1 VIEW COMPARISON:  Radiograph yesterday, additional priors. FINDINGS: Overlying monitoring devices partially obscure evaluation. Heterogeneous bilateral lung opacities are not significantly changed. Unchanged heart size and mediastinal contours. Possible small pleural effusions. No pneumothorax. IMPRESSION: Bilateral lung opacities consistent with COVID pneumonia, extensive and unchanged from yesterday. Electronically Signed   By: Keith Rake M.D.   On: 05/14/2019 03:09   DG Chest Port 1 View  Result Date: 05/13/2019 CLINICAL DATA:  COVID pneumonia. Acute  respiratory failure. EXAM: PORTABLE CHEST 1 VIEW COMPARISON:  One-view chest x-ray 09/05/2019 FINDINGS: Heart size is normal. Progressive diffuse interstitial and airspace disease is evident bilaterally. Small effusions are noted bilaterally. Axial  skeleton is unremarkable. IMPRESSION: 1. Progressive diffuse interstitial and airspace disease bilaterally compatible with pneumonia. Superimposed edema is not excluded. 2. Small bilateral pleural effusions. Electronically Signed   By: San Morelle M.D.   On: 05/13/2019 07:34    ASSESSMENT / PLAN:  Acute hypoxic respiratory failure secondary to metabolic acidosis and [id number] pneumonia Additional complicating factor of MRSA bacteremia Supplemental O2 for dyspnea and/or hypoxia  Maintain O2 sats 88% and above  Continue remdesivir last dose (4/20), decrease steroids, switch to dexamethasone, and vitamin regimen  Continue vancomycin, discontinue linezolid due to precipitous drop in platelets PRN bronchodilator therapy  Aggressive pulmonary hygiene and OOB to chair as tolerated Limited 2D echo showed no valvular vegetations will need TEE at a later time -I had lengthy conversation with brother of patient today and had discussed entire case and current plan with him as well as answered questions.   Septic shock secondary to COVID-19 pneumonia and MRSA bacteremia Mildly elevated troponin secondary to demand ischemia in the setting of acute respiratory failure  Hx: HTN and Hyperlipidemia  Continuous telemetry monitoring  Troponins have leveled off Off of Neo-Synephrine On midodrine Discontinue stress dose steroids -continue antimicrobials as per ID  Acute renal failure  Lactic and metabolic acidosis  Trend BMP and lactic acid Replace electrolytes as indicated  Monitor UOP Avoid nephrotoxic medications as able Trend lactic acid on downward trend   Possible HITS     t3 score moderate    - more likely due to sepsis and covid19     - on argatroban empirically     -platelets trending up      Anemia without obvious signs of bleeding  VTE px: Had been switched to Lovenox, because of thrombocytopenia assessing HIT panel Trend CBC  Monitor for s/sx of bleeding and transfuse for  hgb <7  CT Abd Pelvis 04/10: concerning for high suspicion for low-grade lymphoproliferative disorder  GERD  SUP px: iv pepcid  Oncology recommending once COVID-19 pneumonia resolves pt will need PET scan and and biopsy in the outpatient setting   Type II Diabetes Mellitus Poor control CBG q4hrs  SSI  Adjusted insulin Discontinue stress dose steroids switch to single dose dexamethasone  Severe protein calorie malnutrition Severe hypoalbuminemia Taking p.o. now dysphagia diet 1 Albumin 1.7 Supplementing albumin   Acute encephalopathy likely secondary to sepsis  COVID-19/MRSA associated encephalopathy Frequent reorientation Avoid sedating medications when possible  Continues to improve  Polysubstance abuse Positive UDS for cocaine and opiates Query IV drug use, patient evasive as to form of use May explain MRSA bacteremia Counseling once able to participate  Best Practice: VTE px: Switched to argatroban due to thrombocytopenia, HIT work-up in progress SUP px: iv pepcid  Diet: Per speech dysphagia 1 diet, patient tolerating   Critical care provider statement:    Critical care time (minutes):  34   Critical care time was exclusive of:  Separately billable procedures and  treating other patients   Critical care was necessary to treat or prevent imminent or  life-threatening deterioration of the following conditions:  acute hypoxemic respiratory failure, MRSA bacteremia, multiple comorbid conditions.    Critical care was time spent personally by me on the following  activities:  Development of treatment plan with patient or surrogate,  discussions with consultants, evaluation of  patient's response to  treatment, examination of patient, obtaining history from patient or  surrogate, ordering and performing treatments and interventions, ordering  and review of laboratory studies and re-evaluation of patient's condition   I assumed direction of critical care for this patient  from another  provider in my specialty: no      Ottie Glazier, M.D.  [facility] [place]

## 2019-05-15 ENCOUNTER — Inpatient Hospital Stay: Payer: Medicare Other

## 2019-05-15 LAB — CBC WITH DIFFERENTIAL/PLATELET
Abs Immature Granulocytes: 0.02 10*3/uL (ref 0.00–0.07)
Basophils Absolute: 0 10*3/uL (ref 0.0–0.1)
Basophils Relative: 1 %
Eosinophils Absolute: 0 10*3/uL (ref 0.0–0.5)
Eosinophils Relative: 0 %
HCT: 20.7 % — ABNORMAL LOW (ref 39.0–52.0)
Hemoglobin: 7.5 g/dL — ABNORMAL LOW (ref 13.0–17.0)
Immature Granulocytes: 1 %
Lymphocytes Relative: 6 %
Lymphs Abs: 0.2 10*3/uL — ABNORMAL LOW (ref 0.7–4.0)
MCH: 30.4 pg (ref 26.0–34.0)
MCHC: 36.2 g/dL — ABNORMAL HIGH (ref 30.0–36.0)
MCV: 83.8 fL (ref 80.0–100.0)
Monocytes Absolute: 0.1 10*3/uL (ref 0.1–1.0)
Monocytes Relative: 2 %
Neutro Abs: 3.1 10*3/uL (ref 1.7–7.7)
Neutrophils Relative %: 90 %
Platelets: 102 10*3/uL — ABNORMAL LOW (ref 150–400)
RBC: 2.47 MIL/uL — ABNORMAL LOW (ref 4.22–5.81)
RDW: 13.6 % (ref 11.5–15.5)
WBC Morphology: INCREASED
WBC: 3.5 10*3/uL — ABNORMAL LOW (ref 4.0–10.5)
nRBC: 0 % (ref 0.0–0.2)

## 2019-05-15 LAB — C-REACTIVE PROTEIN: CRP: 18.7 mg/dL — ABNORMAL HIGH (ref <1.0)

## 2019-05-15 LAB — COMPREHENSIVE METABOLIC PANEL
ALT: 24 U/L (ref 0–44)
AST: 46 U/L — ABNORMAL HIGH (ref 15–41)
Albumin: 1.2 g/dL — ABNORMAL LOW (ref 3.5–5.0)
Alkaline Phosphatase: 375 U/L — ABNORMAL HIGH (ref 38–126)
Anion gap: 5 (ref 5–15)
BUN: 26 mg/dL — ABNORMAL HIGH (ref 8–23)
CO2: 22 mmol/L (ref 22–32)
Calcium: 7.4 mg/dL — ABNORMAL LOW (ref 8.9–10.3)
Chloride: 115 mmol/L — ABNORMAL HIGH (ref 98–111)
Creatinine, Ser: 0.66 mg/dL (ref 0.61–1.24)
GFR calc Af Amer: >60 mL/min (ref >60)
GFR calc non Af Amer: >60 mL/min (ref >60)
Glucose, Bld: 124 mg/dL — ABNORMAL HIGH (ref 70–99)
Potassium: 4.3 mmol/L (ref 3.5–5.1)
Sodium: 142 mmol/L (ref 135–145)
Total Bilirubin: 1.3 mg/dL — ABNORMAL HIGH (ref 0.3–1.2)
Total Protein: 5.5 g/dL — ABNORMAL LOW (ref 6.5–8.1)

## 2019-05-15 LAB — CULTURE, BLOOD (ROUTINE X 2)
Culture: NO GROWTH
Culture: NO GROWTH
Special Requests: ADEQUATE
Special Requests: ADEQUATE

## 2019-05-15 LAB — MAGNESIUM: Magnesium: 1.8 mg/dL (ref 1.7–2.4)

## 2019-05-15 LAB — GLUCOSE, CAPILLARY
Glucose-Capillary: 124 mg/dL — ABNORMAL HIGH (ref 70–99)
Glucose-Capillary: 132 mg/dL — ABNORMAL HIGH (ref 70–99)

## 2019-05-15 LAB — PROCALCITONIN: Procalcitonin: 3.27 ng/mL

## 2019-05-15 LAB — FIBRIN DERIVATIVES D-DIMER (ARMC ONLY): Fibrin derivatives D-dimer (ARMC): 6546.13 ng/mL (FEU) — ABNORMAL HIGH (ref 0.00–499.00)

## 2019-05-15 LAB — PHOSPHORUS: Phosphorus: 3.7 mg/dL (ref 2.5–4.6)

## 2019-05-15 LAB — FERRITIN: Ferritin: 943 ng/mL — ABNORMAL HIGH (ref 24–336)

## 2019-05-15 LAB — LACTATE DEHYDROGENASE: LDH: 273 U/L — ABNORMAL HIGH (ref 98–192)

## 2019-05-15 MED ORDER — TECHNETIUM TO 99M ALBUMIN AGGREGATED
4.0000 | Freq: Once | INTRAVENOUS | Status: AC | PRN
  Administered 2019-05-15: 4.567 via INTRAVENOUS

## 2019-05-15 MED ORDER — DEXTROSE 10 % IV SOLN: INTRAVENOUS | Status: DC

## 2019-05-15 MED ORDER — SODIUM CHLORIDE 0.9 % IV SOLN
3.0000 g | Freq: Four times a day (QID) | INTRAVENOUS | Status: DC
  Administered 2019-05-15 – 2019-05-19 (×16): 3 g via INTRAVENOUS
  Filled 2019-05-15 (×3): qty 3
  Filled 2019-05-15: qty 8
  Filled 2019-05-15 (×2): qty 3
  Filled 2019-05-15: qty 8
  Filled 2019-05-15 (×3): qty 3
  Filled 2019-05-15 (×2): qty 8
  Filled 2019-05-15 (×2): qty 3
  Filled 2019-05-15 (×2): qty 8
  Filled 2019-05-15: qty 3
  Filled 2019-05-15 (×2): qty 8
  Filled 2019-05-15 (×2): qty 3

## 2019-05-15 NOTE — Progress Notes (Signed)
PT Cancellation Note  Patient Details Name: Victor Arnold. MRN: [id number] DOB: 02-02-1955   Cancelled Treatment:    Reason Eval/Treat Not Completed: Medical issues which prohibited therapy.Patient currently on bipap. due to low respiratory sats; will hold at this time for exertional mobility assessment and re-attempt if appropriate.Sherre Poot, Sherryl Barters, PT DPT 05/15/2019, 1:34 PM

## 2019-05-15 NOTE — Progress Notes (Signed)
[place] for Electrolyte Monitoring and Replacement   Recent Labs: Potassium (mmol/L)  Date Value  05/15/2019 4.3   Magnesium (mg/dL)  Date Value  05/15/2019 1.8   Calcium (mg/dL)  Date Value  05/15/2019 7.4 (L)   Albumin (g/dL)  Date Value  05/15/2019 1.2 (L)   Phosphorus (mg/dL)  Date Value  05/15/2019 3.7   Sodium (mmol/L)  Date Value  05/15/2019 142   Corrected Calcium: 8.34  Assessment: 64 year old male admitted after recent diagnosis of COVID and worsening condition. Patient was admitted to ICU and started on pressors.   Goal of Therapy:  Electrolytes WNL  Plan:  K 4.3  Mag 1.8  Phos 3.7  Scr 0.66 No supplementation at this time Will follow up electrolytes with morning labs and replace as indicated.  Noralee Space, PharmD Clinical Pharmacist 05/15/2019 10:33 AM

## 2019-05-15 NOTE — Progress Notes (Signed)
Pharmacy Antibiotic Note  Victor Deptula. is a 64 y.o. male admitted on 05/20/2019. Patient recently diagnosed with COVID, presented with worsening condition per family. Patient was hypotensive requiring vasopressors, also started on stress dose steroids in ICU. Pharmacy has been consulted for vancomycin dosing for positive blood cultures. ID started linezolid for possible MRSA PNA (now stopped due low platelets).   Blood cultures with MRSA in 4/4 bottles. Hx drug abuse- Urine tox positive for cocaine/opiates  Day #10 vancomycin (also on linezolid 4/17 - 4/18, but linezolide stopped d/t low thrombocytopenia)  - Repeat blood cultures ordered, No growth x2 days - WBC:3.5  - SCr WNL 4/21, appears relatively stable - 4/17 TTE no evidence of endocarditis - vancomycin levels performed with 4/19 dose (on 1500mg  IV q24h @ 0942)  - vanco peak = 29 mcg/mL at 12:51  - random vanco = 13 mcg/mL at 05:18  - calculated AUC = 455 (trough = 10.5)  Plan:  Continue vancomycin to 1500 mg IV q24h as AUC is at goal and patient improving per notes  Follow repeat Blood culture result  Follow renal function  Await plan for TEE   Height: 5\' 11"  (180.3 cm) Weight: 81.3 kg (179 lb 3.7 oz) IBW/kg (Calculated) : 75.3  Temp (24hrs), Avg:98.2 F (36.8 C), Min:96.7 F (35.9 C), Max:101 F (38.3 C)  Recent Labs  Lab 05/09/19 0334 05/10/19 0405 05/10/19 1251 05/11/19 0518 05/12/19 0416 05/13/19 0626 05/14/19 0606 05/15/19 0509  WBC 7.1   < >  --  4.7 5.1 3.8* 3.9* 3.5*  CREATININE 0.97   < >  --  0.84 0.86 0.80 0.76 0.66  LATICACIDVEN 2.2*  --   --   --   --   --   --   --   VANCOTROUGH  --   --   --  13*  --   --   --   --   VANCOPEAK  --   --  29*  --   --   --   --   --    < > = values in this interval not displayed.    Estimated Creatinine Clearance: 98 mL/min (by C-G formula based on SCr of 0.66 mg/dL).    No Active Allergies  Antimicrobials this admission: Vancomycin 4/15  >> Ceftriaxone 4/15 >>4/16 Azithromycin 4/15 >> Linezolid 4/17>> 4/18 Unasyn 4/22>> 4/23  Dose adjustments this admission: NA  Microbiology results: 4/15 BCx: 4/4 MRSA 4/15 UCx: MRSA   4/15 MRSA PCR: positive 4/19 Bcx: NGTD  Thank you for allowing pharmacy to be a part of this patient's care.  Victor Arnold, PharmD, BCPS Clinical Pharmacist 05/15/2019 10:40 AM

## 2019-05-15 NOTE — Progress Notes (Signed)
Name: Victor Arnold. MRN: [id number] DOB: 1955/05/03    ADMISSION DATE:  04/30/2019 INITIAL CONSULTATION DATE: 05/11/2019  REFERRING MD : Dr. Owens Shark   CHIEF COMPLAINT: Shortness of Breath   BRIEF PATIENT DESCRIPTION:  64 yo male with recent CT Abd Pelvis results on 04/10 concerning for high suspicion of low-grade lymphoproliferative disorder oncology recommended once COVID-19 pneumonia resolves pt will need PET scan and biopsy in the outpatient setting.  He was recently hospitalized and diagnosed with COVID-19 on 04/10 but left AMA on 04/14.  He is currently admitted with septic shock and acute hypoxic respiratory failure secondary to COVID-19 pneumonia and acute renal failure with metabolic/lactic acidosis requiring levophed gtt   SIGNIFICANT EVENTS/STUDIES:  04/15: Pt admitted to ICU with COVID-19 pneumonia  04/16: MRSA bacteremia, encephalopathy 04/17: Mentation clearing, no increase in oxygen requirement 04/18: Worsening thrombocytopenia, work-up for HIT, switched to argatroban, discontinued linezolid 4/23- patient states he is feeling better, wanted me to help re-arrange bed for comfort during exam otherwise no complaints today 4/24- patient is similar to yesterday , we had V/q scan done today with no PE found. Discussed care plan with ID , am holidng off on Actemra due to MRSA bacteremia   CULTURES:  COVID-19 04/10>>positive  MRSA PCR 04/15>> positive Blood x2 04/15>> positive,  MRSA Urine 04/15>> positive, MRSA   . sodium chloride Stopped (05/01/2019 0641)  . sodium chloride Stopped (05/14/19 0947)  . ampicillin-sulbactam (UNASYN) IV    . phenylephrine (NEO-SYNEPHRINE) Adult infusion Stopped (05/14/19 0834)  . vancomycin 1,500 mg (05/15/19 1019)    No Active Allergies  REVIEW OF SYSTEMS: Limited due to patient's critically ill status  SUBJECTIVE:  No distress on 2L O2 via nasal canula.  Voices no complaint today.  Still slow with responses.  VITAL SIGNS: Temp:  [97  F (36.1 C)-101 F (38.3 C)] 98.4 F (36.9 C) (04/24 1300) Pulse Rate:  [81-111] 102 (04/24 1500) Resp:  [18-32] 32 (04/24 1500) BP: (98-121)/(52-77) 98/53 (04/24 1500) SpO2:  [88 %-97 %] 90 % (04/24 1500) FiO2 (%):  [60 %-70 %] 70 % (04/24 0800)  PHYSICAL EXAMINATION Physical Exam  Constitutional: He appears well-developed.  HENT:  Head: Normocephalic and atraumatic.  Eyes: Pupils are equal, round, and reactive to light.  Neck: No JVD present. No tracheal deviation present. No thyromegaly present.  Cardiovascular:  No murmur heard. Pulmonary/Chest: No respiratory distress. He has no wheezes. He has rales.  Abdominal: He exhibits no distension.  Musculoskeletal:        General: No edema.     Cervical back: Normal range of motion.  Skin: Skin is dry.  Vitals reviewed.     Recent Labs  Lab 05/13/19 0626 05/14/19 0606 05/15/19 0509  NA 139 139 142  K 3.9  3.8 3.4* 4.3  CL 113* 111 115*  CO2 21* 21* 22  BUN 31* 27* 26*  CREATININE 0.80 0.76 0.66  GLUCOSE 154* 120* 124*   Recent Labs  Lab 05/13/19 0626 05/14/19 0606 05/15/19 0509  HGB 9.0* 8.3* 7.5*  HCT 25.6* 24.0* 20.7*  WBC 3.8* 3.9* 3.5*  PLT 98* 106* 102*   NM Pulmonary Perfusion  Result Date: 05/15/2019 CLINICAL DATA:  64 year old male with COVID pneumonia and acute hypoxemic respiratory failure concerning for PE. EXAM: NUCLEAR MEDICINE PERFUSION LUNG SCAN TECHNIQUE: Perfusion images were obtained in multiple projections after intravenous injection of radiopharmaceutical. Ventilation scans intentionally deferred if perfusion scan and chest x-ray adequate for interpretation during COVID 19 epidemic. RADIOPHARMACEUTICALS:  4.567 mCi Tc-28m MAA IV COMPARISON:  Chest x-ray 05/15/2019 FINDINGS: No peripheral wedge-shaped perfusion defects in either lung. Mildly mottled perfusion uptake slightly worse on the right than the left consistent with known underlying multifocal pneumonia. IMPRESSION: Low probability for  acute pulmonary embolus. Electronically Signed   By: Jacqulynn Cadet M.D.   On: 05/15/2019 13:54   DG Chest Port 1 View  Result Date: 05/15/2019 CLINICAL DATA:  Recent diagnosis of COVID-19.  Worsening. EXAM: PORTABLE CHEST 1 VIEW COMPARISON:  May 14, 2019 FINDINGS: Bilateral diffuse pulmonary infiltrates have worsened in the interval. Small pleural effusions. Stable cardiomediastinal silhouette. No pneumothorax. IMPRESSION: Worsening bilateral pulmonary infiltrates consistent with the patient's known pneumonia Electronically Signed   By: Dorise Bullion III M.D   On: 05/15/2019 11:13   DG Chest Port 1 View  Result Date: 05/14/2019 CLINICAL DATA:  Acute respiratory failure with hypoxia. COVID positive 05/01/2019 EXAM: PORTABLE CHEST 1 VIEW COMPARISON:  Radiograph yesterday, additional priors. FINDINGS: Overlying monitoring devices partially obscure evaluation. Heterogeneous bilateral lung opacities are not significantly changed. Unchanged heart size and mediastinal contours. Possible small pleural effusions. No pneumothorax. IMPRESSION: Bilateral lung opacities consistent with COVID pneumonia, extensive and unchanged from yesterday. Electronically Signed   By: Keith Rake M.D.   On: 05/14/2019 03:09    ASSESSMENT / PLAN:  Acute hypoxic respiratory failure secondary to metabolic acidosis and [id number] pneumonia Additional complicating factor of MRSA bacteremia Supplemental O2 for dyspnea and/or hypoxia  Maintain O2 sats 88% and above  Continue remdesivir last dose (4/20), decrease steroids, switch to dexamethasone, and vitamin regimen  Continue vancomycin, discontinue linezolid due to precipitous drop in platelets PRN bronchodilator therapy  Aggressive pulmonary hygiene and OOB to chair as tolerated Limited 2D echo showed no valvular vegetations will need TEE at a later time -I had lengthy conversation with brother of patient today and had discussed entire case and current plan with him  as well as answered questions.   Septic shock secondary to COVID-19 pneumonia and MRSA bacteremia Mildly elevated troponin secondary to demand ischemia in the setting of acute respiratory failure  Hx: HTN and Hyperlipidemia  Continuous telemetry monitoring  Troponins have leveled off Off of Neo-Synephrine On midodrine Discontinue stress dose steroids -continue antimicrobials as per ID  Acute renal failure  Lactic and metabolic acidosis  Trend BMP and lactic acid Replace electrolytes as indicated  Monitor UOP Avoid nephrotoxic medications as able Trend lactic acid on downward trend   Possible HITS     t3 score moderate    - more likely due to sepsis and covid19     - on argatroban empirically     -platelets trending up      Anemia without obvious signs of bleeding  VTE px: Had been switched to Lovenox, because of thrombocytopenia assessing HIT panel Trend CBC  Monitor for s/sx of bleeding and transfuse for hgb <7  CT Abd Pelvis 04/10: concerning for high suspicion for low-grade lymphoproliferative disorder  GERD  SUP px: iv pepcid  Oncology recommending once COVID-19 pneumonia resolves pt will need PET scan and and biopsy in the outpatient setting   Type II Diabetes Mellitus Poor control CBG q4hrs  SSI  Adjusted insulin Discontinue stress dose steroids switch to single dose dexamethasone  Severe protein calorie malnutrition Severe hypoalbuminemia Taking p.o. now dysphagia diet 1 Albumin 1.7 Supplementing albumin   Acute encephalopathy likely secondary to sepsis  COVID-19/MRSA associated encephalopathy Frequent reorientation Avoid sedating medications when possible  Continues to improve  Polysubstance  abuse Positive UDS for cocaine and opiates Query IV drug use, patient evasive as to form of use May explain MRSA bacteremia Counseling once able to participate  Best Practice: VTE px: Switched to argatroban due to thrombocytopenia, HIT work-up in  progress SUP px: iv pepcid  Diet: Per speech dysphagia 1 diet, patient tolerating   Critical care provider statement:    Critical care time (minutes):  34   Critical care time was exclusive of:  Separately billable procedures and  treating other patients   Critical care was necessary to treat or prevent imminent or  life-threatening deterioration of the following conditions:  acute hypoxemic respiratory failure, MRSA bacteremia, multiple comorbid conditions.    Critical care was time spent personally by me on the following  activities:  Development of treatment plan with patient or surrogate,  discussions with consultants, evaluation of patient's response to  treatment, examination of patient, obtaining history from patient or  surrogate, ordering and performing treatments and interventions, ordering  and review of laboratory studies and re-evaluation of patient's condition   I assumed direction of critical care for this patient from another  provider in my specialty: no      Ottie Glazier, M.D.  Pulmonary [facility] [place]

## 2019-05-15 NOTE — Progress Notes (Signed)
ID Chart reviewed and spoke to his nurse covid pneumonia Acute hypoxic resp failure MRSA bacteremia Cocaine use   Fever one episode 101 Still has hypoxia On BIPAP  Patient Vitals for the past 24 hrs:  BP Temp Temp src Pulse Resp SpO2  05/15/19 0800 121/62 (!) 101 F (38.3 C) Axillary (!) 111 (!) 23 (!) 88 %  05/15/19 0700 (!) 110/52 -- -- (!) 101 (!) 31 96 %  05/15/19 0400 -- 99.5 F (37.5 C) Axillary -- -- --  05/15/19 0000 -- 97.7 F (36.5 C) Axillary -- -- --  05/14/19 2000 -- 97.9 F (36.6 C) Axillary -- -- --  05/14/19 1800 110/77 (!) 97 F (36.1 C) Axillary 88 (!) 27 97 %  05/14/19 1700 110/62 -- -- 81 18 96 %  05/14/19 1600 (!) 141/70 -- -- 88 (!) 27 96 %  05/14/19 1546 -- (!) 96.7 F (35.9 C) Axillary -- -- --  05/14/19 1500 120/65 -- -- 91 (!) 29 99 %  05/14/19 1400 108/65 -- -- 84 (!) 27 100 %  05/14/19 1315 112/63 (!) 97.5 F (36.4 C) Axillary 87 (!) 27 99 %  05/14/19 1300 (!) 91/57 -- -- 89 (!) 21 97 %     CBC Latest Ref Rng & Units 05/15/2019 05/14/2019 05/13/2019  WBC 4.0 - 10.5 K/uL 3.5(L) 3.9(L) 3.8(L)  Hemoglobin 13.0 - 17.0 g/dL 7.5(L) 8.3(L) 9.0(L)  Hematocrit 39.0 - 52.0 % 20.7(L) 24.0(L) 25.6(L)  Platelets 150 - 400 K/uL 102(L) 106(L) 98(L)    CMP Latest Ref Rng & Units 05/15/2019 05/14/2019 05/13/2019  Glucose 70 - 99 mg/dL 124(H) 120(H) 154(H)  BUN 8 - 23 mg/dL 26(H) 27(H) 31(H)  Creatinine 0.61 - 1.24 mg/dL 0.66 0.76 0.80  Sodium 135 - 145 mmol/L 142 139 139  Potassium 3.5 - 5.1 mmol/L 4.3 3.4(L) 3.9  Chloride 98 - 111 mmol/L 115(H) 111 113(H)  CO2 22 - 32 mmol/L 22 21(L) 21(L)  Calcium 8.9 - 10.3 mg/dL 7.4(L) 7.3(L) 7.4(L)  Total Protein 6.5 - 8.1 g/dL 5.5(L) 5.6(L) 5.6(L)  Total Bilirubin 0.3 - 1.2 mg/dL 1.3(H) 1.5(H) 1.4(H)  Alkaline Phos 38 - 126 U/L 375(H) 263(H) 348(H)  AST 15 - 41 U/L 46(H) 36 50(H)  ALT 0 - 44 U/L 24 24 28    BC 4/15 MRSA BC- 4/19 - NG 4/15 UC-MRSA  CXR fom 4/24 Worsening bilateral pulmonary infiltrates  consistent with the patient's known pneumonia  Impression/recommendation MRSA bacteremia -on vancomycin- because of b/l lung infiltrates /b/l pneumonia MRSA superinfection was a concern and he was started on PO linezolid but it had to be stopped because of drop in platelet count after 3 doses only!! Restart Unasyn as fever after stopping -there is  concern for aspiration  One episode of fever- if it recurs repeat blood culture   COVID pneumonia with acute hypoxic resp failure- management as per pulmonologist .    Increasing alkaline phosphatase may need to look at the [place] with [place]  Cocaine use- A risk factor for worsening COVID pneumonitis  Chronic urinary retention-has foley Pt seriously ill                 Discussed the management with his nurse

## 2019-05-16 ENCOUNTER — Inpatient Hospital Stay: Payer: Medicare Other

## 2019-05-16 LAB — CBC WITH DIFFERENTIAL/PLATELET
Abs Immature Granulocytes: 0.03 10*3/uL (ref 0.00–0.07)
Basophils Absolute: 0 10*3/uL (ref 0.0–0.1)
Basophils Relative: 1 %
Eosinophils Absolute: 0 10*3/uL (ref 0.0–0.5)
Eosinophils Relative: 0 %
HCT: 23.9 % — ABNORMAL LOW (ref 39.0–52.0)
Hemoglobin: 8.1 g/dL — ABNORMAL LOW (ref 13.0–17.0)
Immature Granulocytes: 1 %
Lymphocytes Relative: 7 %
Lymphs Abs: 0.3 10*3/uL — ABNORMAL LOW (ref 0.7–4.0)
MCH: 29.9 pg (ref 26.0–34.0)
MCHC: 33.9 g/dL (ref 30.0–36.0)
MCV: 88.2 fL (ref 80.0–100.0)
Monocytes Absolute: 0.1 10*3/uL (ref 0.1–1.0)
Monocytes Relative: 2 %
Neutro Abs: 3.1 10*3/uL (ref 1.7–7.7)
Neutrophils Relative %: 89 %
Platelets: 89 10*3/uL — ABNORMAL LOW (ref 150–400)
RBC: 2.71 MIL/uL — ABNORMAL LOW (ref 4.22–5.81)
RDW: 14.1 % (ref 11.5–15.5)
WBC Morphology: INCREASED
WBC: 3.4 10*3/uL — ABNORMAL LOW (ref 4.0–10.5)
nRBC: 0 % (ref 0.0–0.2)

## 2019-05-16 LAB — GLUCOSE, CAPILLARY
Glucose-Capillary: 119 mg/dL — ABNORMAL HIGH (ref 70–99)
Glucose-Capillary: 225 mg/dL — ABNORMAL HIGH (ref 70–99)
Glucose-Capillary: 225 mg/dL — ABNORMAL HIGH (ref 70–99)
Glucose-Capillary: 292 mg/dL — ABNORMAL HIGH (ref 70–99)
Glucose-Capillary: 267 mg/dL — ABNORMAL HIGH (ref 70–99)
Glucose-Capillary: 217 mg/dL — ABNORMAL HIGH (ref 70–99)
Glucose-Capillary: 188 mg/dL — ABNORMAL HIGH (ref 70–99)

## 2019-05-16 LAB — TRIGLYCERIDES: Triglycerides: 80 mg/dL (ref <150)

## 2019-05-16 LAB — BLOOD GAS, ARTERIAL
Acid-base deficit: 4.4 mmol/L — ABNORMAL HIGH (ref 0.0–2.0)
Bicarbonate: 26.1 mmol/L (ref 20.0–28.0)
FIO2: 1
MECHVT: 500 mL
Mechanical Rate: 18
O2 Saturation: 97.1 %
PEEP: 8 cmH2O
Patient temperature: 37
RATE: 18 resp/min
pCO2 arterial: 75 mmHg (ref 32.0–48.0)
pH, Arterial: 7.15 — CL (ref 7.350–7.450)
pO2, Arterial: 115 mmHg — ABNORMAL HIGH (ref 83.0–108.0)

## 2019-05-16 LAB — COMPREHENSIVE METABOLIC PANEL
ALT: 25 U/L (ref 0–44)
AST: 30 U/L (ref 15–41)
Albumin: 1.3 g/dL — ABNORMAL LOW (ref 3.5–5.0)
Alkaline Phosphatase: 375 U/L — ABNORMAL HIGH (ref 38–126)
Anion gap: 7 (ref 5–15)
BUN: 29 mg/dL — ABNORMAL HIGH (ref 8–23)
CO2: 23 mmol/L (ref 22–32)
Calcium: 7.6 mg/dL — ABNORMAL LOW (ref 8.9–10.3)
Chloride: 113 mmol/L — ABNORMAL HIGH (ref 98–111)
Creatinine, Ser: 0.79 mg/dL (ref 0.61–1.24)
GFR calc Af Amer: >60 mL/min (ref >60)
GFR calc non Af Amer: >60 mL/min (ref >60)
Glucose, Bld: 297 mg/dL — ABNORMAL HIGH (ref 70–99)
Potassium: 3.7 mmol/L (ref 3.5–5.1)
Sodium: 143 mmol/L (ref 135–145)
Total Bilirubin: 1.6 mg/dL — ABNORMAL HIGH (ref 0.3–1.2)
Total Protein: 6 g/dL — ABNORMAL LOW (ref 6.5–8.1)

## 2019-05-16 LAB — PHOSPHORUS: Phosphorus: 4 mg/dL (ref 2.5–4.6)

## 2019-05-16 LAB — MAGNESIUM: Magnesium: 2 mg/dL (ref 1.7–2.4)

## 2019-05-16 MED ORDER — FENTANYL CITRATE (PF) 100 MCG/2ML IJ SOLN
INTRAMUSCULAR | Status: AC
  Administered 2019-05-16: 100 ug via INTRAVENOUS
  Filled 2019-05-16: qty 2

## 2019-05-16 MED ORDER — ETOMIDATE 2 MG/ML IV SOLN
INTRAVENOUS | Status: AC
  Administered 2019-05-16: 20 mg via INTRAVENOUS
  Filled 2019-05-16: qty 10

## 2019-05-16 MED ORDER — ROCURONIUM BROMIDE 50 MG/5ML IV SOLN: 50.0000 mg | Freq: Once | INTRAVENOUS | Status: AC

## 2019-05-16 MED ORDER — ETOMIDATE 2 MG/ML IV SOLN: 20.0000 mg | Freq: Once | INTRAVENOUS | Status: AC

## 2019-05-16 MED ORDER — NOREPINEPHRINE 16 MG/250ML-% IV SOLN
0.0000 ug/min | INTRAVENOUS | Status: DC
  Administered 2019-05-16: 2 ug/min via INTRAVENOUS
  Administered 2019-05-19: 3 ug/min via INTRAVENOUS
  Filled 2019-05-16 (×2): qty 250

## 2019-05-16 MED ORDER — NOREPINEPHRINE 4 MG/250ML-% IV SOLN: 2.0000 ug/min | INTRAVENOUS | Status: DC

## 2019-05-16 MED ORDER — ORAL CARE MOUTH RINSE
15.0000 mL | Freq: Two times a day (BID) | OROMUCOSAL | Status: DC
  Administered 2019-05-17 (×2): 15 mL via OROMUCOSAL

## 2019-05-16 MED ORDER — PROPOFOL 1000 MG/100ML IV EMUL
INTRAVENOUS | Status: AC
  Administered 2019-05-16: 10 ug/kg/min via INTRAVENOUS
  Filled 2019-05-16: qty 100

## 2019-05-16 MED ORDER — IPRATROPIUM-ALBUTEROL 0.5-2.5 (3) MG/3ML IN SOLN: 3.0000 mL | RESPIRATORY_TRACT | Status: DC | PRN

## 2019-05-16 MED ORDER — NOREPINEPHRINE 4 MG/250ML-% IV SOLN
INTRAVENOUS | Status: AC
  Administered 2019-05-16: 6 ug/min via INTRAVENOUS
  Filled 2019-05-16: qty 250

## 2019-05-16 MED ORDER — ROCURONIUM BROMIDE 50 MG/5ML IV SOLN
INTRAVENOUS | Status: AC
  Administered 2019-05-16: 50 mg via INTRAVENOUS
  Filled 2019-05-16: qty 1

## 2019-05-16 MED ORDER — PROPOFOL 1000 MG/100ML IV EMUL
5.0000 ug/kg/min | INTRAVENOUS | Status: AC
  Administered 2019-05-17: 35 ug/kg/min via INTRAVENOUS
  Administered 2019-05-17 – 2019-05-18 (×5): 30 ug/kg/min via INTRAVENOUS
  Filled 2019-05-16 (×6): qty 100

## 2019-05-16 MED ORDER — SODIUM CHLORIDE 0.9 % IV BOLUS
500.0000 mL | Freq: Once | INTRAVENOUS | Status: AC
  Administered 2019-05-16: 500 mL via INTRAVENOUS

## 2019-05-16 MED ORDER — SODIUM CHLORIDE 0.9 % IV SOLN
250.0000 mL | INTRAVENOUS | Status: DC
  Administered 2019-05-16: 250 mL via INTRAVENOUS

## 2019-05-16 MED ORDER — BLISTEX MEDICATED EX OINT
TOPICAL_OINTMENT | CUTANEOUS | Status: DC | PRN
  Filled 2019-05-16: qty 6.3

## 2019-05-16 MED ORDER — FENTANYL CITRATE (PF) 100 MCG/2ML IJ SOLN: 100.0000 ug | Freq: Once | INTRAMUSCULAR | Status: AC

## 2019-05-16 MED ORDER — FENTANYL 2500MCG IN NS 250ML (10MCG/ML) PREMIX INFUSION
0.0000 ug/h | INTRAVENOUS | Status: DC
  Administered 2019-05-16: 50 ug/h via INTRAVENOUS
  Administered 2019-05-17: 100 ug/h via INTRAVENOUS
  Administered 2019-05-18 – 2019-05-19 (×2): 150 ug/h via INTRAVENOUS
  Filled 2019-05-16 (×4): qty 250

## 2019-05-16 MED ORDER — CHLORHEXIDINE GLUCONATE 0.12 % MT SOLN
15.0000 mL | Freq: Two times a day (BID) | OROMUCOSAL | Status: DC
  Administered 2019-05-16 – 2019-05-17 (×4): 15 mL via OROMUCOSAL
  Filled 2019-05-16 (×2): qty 15

## 2019-05-16 MED ORDER — ETOMIDATE 2 MG/ML IV SOLN
INTRAVENOUS | Status: AC
  Filled 2019-05-16: qty 10

## 2019-05-16 NOTE — Progress Notes (Signed)
PT Cancellation Note  Patient Details Name: Victor Arnold. MRN: [id number] DOB: Dec 05, 1955   Cancelled Treatment:    Reason Eval/Treat Not Completed: Medical issues which prohibited therapy, Patient currently on bipap. due to low respiratory sats; will hold at this time for exertional mobility assessment and re-attempt if appropriate. [address], [place], [place] DPT 05/16/2019, 2:17 PM

## 2019-05-16 NOTE — Progress Notes (Signed)
Pharmacy Antibiotic Note  Victor Arnold. is a 63 y.o. male admitted on 04/25/2019. Patient recently diagnosed with COVID, presented with worsening condition per family. Patient was hypotensive requiring vasopressors, also started on stress dose steroids in ICU. Pharmacy has been consulted for vancomycin dosing for MRSA bacteremia. ID started linezolid for possible MRSA PNA? (now stopped due low platelets).   Blood cultures with MRSA in 4/4 bottles. Hx drug abuse- Urine tox positive for cocaine/opiates  Day #11 vancomycin (also on linezolid 4/17 - 4/18, but linezolide stopped d/t low thrombocytopenia)  - Repeat blood cultures ordered, No growth x2 days - WBC:3.5  - f/u SCr  - 4/17 TTE no evidence of endocarditis - vancomycin levels performed with 4/19 dose (on 1500mg  IV q24h @ 0942)  - vanco peak = 29 mcg/mL at 12:51  - random vanco = 13 mcg/mL at 05:18  - calculated AUC = 455 (trough = 10.5)  -Unasyn restarted 4/24 - had fever when it was stopped 4/23, see ID note. Concern for aspiration.   Plan:  Continue vancomycin to 1500 mg IV q24h as AUC is at goal and patient improving per notes  Follow repeat Blood culture result  Follow renal function  Await plan for TEE   Height: 5\' 11"  (180.3 cm) Weight: 81.3 kg (179 lb 3.7 oz) IBW/kg (Calculated) : 75.3  Temp (24hrs), Avg:97.8 F (36.6 C), Min:97.5 F (36.4 C), Max:98.4 F (36.9 C)  Recent Labs  Lab 05/10/19 0405 05/10/19 1251 05/11/19 0518 05/11/19 0518 05/12/19 0416 05/13/19 0626 05/14/19 0606 05/15/19 0509 05/16/19 0355  WBC   < >  --  4.7   < > 5.1 3.8* 3.9* 3.5* 3.4*  CREATININE   < >  --  0.84   < > 0.86 0.80 0.76 0.66 0.79  VANCOTROUGH  --   --  13*  --   --   --   --   --   --   VANCOPEAK  --  29*  --   --   --   --   --   --   --    < > = values in this interval not displayed.    Estimated Creatinine Clearance: 98 mL/min (by C-G formula based on SCr of 0.79 mg/dL).    No Active  Allergies  Antimicrobials this admission: Vancomycin 4/15 >> Ceftriaxone 4/15 >>4/16 Azithromycin 4/15 >> Linezolid 4/17>> 4/18 Unasyn 4/22>> 4/23 Unasyn 4/24 >>  Dose adjustments this admission: NA  Microbiology results: 4/15 BCx: 4/4 MRSA 4/15 UCx: MRSA   4/15 MRSA PCR: positive 4/19 Bcx: NGTD  Thank you for allowing pharmacy to be a part of this patient's care.  Noralee Space, PharmD, BCPS Clinical Pharmacist 05/16/2019 9:34 AM

## 2019-05-16 NOTE — Progress Notes (Signed)
Called and updatde pt's brother Riki Altes regarding his brother's decline and need for emergent intubation. All questions answered.   Darel Hong, AGACNP-BC [facility] Pager: [phone]

## 2019-05-16 NOTE — Significant Event (Signed)
Pt noted to have agonal respirations on BiPAP and also minimally responsive.  Given AMS with COVID, and concern for impending respiratory arrest, will proceed with emergent intubation.    Darel Hong, AGACNP-BC [facility] Pager: [phone]

## 2019-05-16 NOTE — Procedures (Signed)
Central Venous Catheter Insertion Procedure Note Victor Arnold [id number] [birth year]  Procedure: Insertion of Central Venous Catheter Indications: Assessment of intravascular volume, Drug and/or fluid administration and Frequent blood sampling  Procedure Details Consent: Unable to obtain consent because of emergent medical necessity. Time Out: Verified patient identification, verified procedure, site/side was marked, verified correct patient position, special equipment/implants available, medications/allergies/relevent history reviewed, required imaging and test results available.  Performed  Maximum sterile technique was used including antiseptics, cap, gloves, gown, hand hygiene, mask and sheet. Skin prep: Chlorhexidine; local anesthetic administered A antimicrobial bonded/coated triple lumen catheter was placed in the left internal jugular vein using the Seldinger technique.  Evaluation Blood flow good Complications: No apparent complications Patient did tolerate procedure well. Chest X-ray ordered to verify placement.  CXR: pending.   Procedure was performed using Ultrasound for direct visualization of cannulization of Left IJ.  Line was secured at the 20 cm mark.  BIOPATCH placed over insertion site.   Victor Arnold, AGACNP-BC [facility] Pager: [phone]  Victor Arnold 05/16/2019, 9:15 PM

## 2019-05-16 NOTE — Progress Notes (Signed)
[facility] for Electrolyte Monitoring and Replacement   Recent Labs: Potassium (mmol/L)  Date Value  05/16/2019 3.7   Magnesium (mg/dL)  Date Value  05/16/2019 2.0   Calcium (mg/dL)  Date Value  05/16/2019 7.6 (L)   Albumin (g/dL)  Date Value  05/16/2019 1.3 (L)   Phosphorus (mg/dL)  Date Value  05/16/2019 4.0   Sodium (mmol/L)  Date Value  05/16/2019 143   Corrected Calcium: 8.34  Assessment: 64 year old male admitted after recent diagnosis of COVID and worsening condition. Patient was admitted to ICU and started on pressors.   Goal of Therapy:  Electrolytes WNL  Plan:  K 3.7  Mag 2.0  Phos 4.0  Scr 0.79 No supplementation at this time Will follow up electrolytes with morning labs and replace as indicated.  Noralee Space, PharmD Clinical Pharmacist 05/16/2019 9:31 AM

## 2019-05-16 NOTE — Procedures (Signed)
Endotracheal Intubation: Intubation Procedure Note Victor Arnold [id number] 30-Sep-1955  Procedure: Intubation Indications: Respiratory insufficiency  Procedure Details Consent: Unable to obtain consent because of emergent medical necessity. Time Out: Verified patient identification, verified procedure, site/side was marked, verified correct patient position, special equipment/implants available, medications/allergies/relevent history reviewed, required imaging and test results available.  Performed  Maximum sterile technique was used including cap, gloves, gown, hand hygiene and mask.  4  Hand washing performed prior to starting the procedure.    Medications administered for sedation prior to procedure:  Fentanyl 100 mcg IV, 20 mg Etomidate IV, Rocuronium 50 mg IV     A time out procedure was called and correct patient, name, & ID confirmed. Needed supplies and equipment were assembled and checked to include ETT, 10 ml syringe, Glidescope, Mac and Miller blades, suction, oxygen and bag mask valve, end tidal CO2 monitor.    Patient was positioned to align the mouth and pharynx to facilitate visualization of the glottis.    Heart rate, SpO2 and blood pressure was continuously monitored during the procedure. Pre-oxygenation was conducted prior to intubation and endotracheal tube was placed through the vocal cords into the trachea.      The artificial airway was placed under direct visualization via glidescope route using a 7.5 cm ETT on the first attempt.   ETT was secured at 25 cm..   Placement was confirmed by auscuitation of lungs with good breath sounds bilaterally and no stomach sounds.  Condensation was noted on endotracheal tube.   Pulse ox 92%, improved to 95%.  CO2 detector in place with appropriate color change.    Complications: None .    Evaluation Hemodynamic Status: Transient hypotension treated with fluid; O2 sats: transiently fell during during procedure and  currently acceptable Patient's Current Condition: stable Complications: No apparent complications Patient did tolerate procedure well. Chest X-ray ordered to verify placement.  CXR: tube position acceptable.        Victor Arnold, AGACNP-BC [facility] Pager: [phone]  Victor Arnold 05/16/2019

## 2019-05-16 NOTE — Progress Notes (Signed)
Name: Victor Arnold. MRN: [id number] DOB: 1955-09-14    ADMISSION DATE:  04/30/2019 INITIAL CONSULTATION DATE: 05/11/2019  REFERRING MD : Dr. Owens Shark   CHIEF COMPLAINT: Shortness of Breath   BRIEF PATIENT DESCRIPTION:  64 yo male with recent CT Abd Pelvis results on 04/10 concerning for high suspicion of low-grade lymphoproliferative disorder oncology recommended once COVID-19 pneumonia resolves pt will need PET scan and biopsy in the outpatient setting.  He was recently hospitalized and diagnosed with COVID-19 on 04/10 but left AMA on 04/14.  He is currently admitted with septic shock and acute hypoxic respiratory failure secondary to COVID-19 pneumonia and acute renal failure with metabolic/lactic acidosis requiring levophed gtt   SIGNIFICANT EVENTS/STUDIES:  04/15: Pt admitted to ICU with COVID-19 pneumonia  04/16: MRSA bacteremia, encephalopathy 04/17: [place] clearing, no increase in oxygen requirement 04/18: Worsening thrombocytopenia, work-up for HIT, switched to argatroban, discontinued linezolid 4/23- patient states he is feeling better, wanted me to help re-arrange bed for comfort during exam otherwise no complaints today 4/24- patient is similar to yesterday , we had V/q scan done today with no PE found. Discussed care plan with ID , am holidng off on Actemra due to MRSA bacteremia. 4/25- patient is awake, sitting up in bed, speaking in no distress. Will wean HFNC and optimize for downgrade to medical floor today.    CULTURES:  COVID-19 04/10>>positive  MRSA PCR 04/15>> positive Blood x2 04/15>> positive,  MRSA Urine 04/15>> positive, MRSA   . sodium chloride Stopped (05/11/2019 0641)  . sodium chloride Stopped (05/14/19 0947)  . ampicillin-sulbactam (UNASYN) IV 3 g (05/16/19 0900)  . phenylephrine (NEO-SYNEPHRINE) Adult infusion Stopped (05/14/19 0834)  . vancomycin 1,500 mg (05/16/19 0941)    No Active Allergies  REVIEW OF SYSTEMS: Limited due to patient's  critically ill status  SUBJECTIVE:  No distress on 2L O2 via nasal canula.  Voices no complaint today.  Still slow with responses.  VITAL SIGNS: Temp:  [97.5 F (36.4 C)-98.4 F (36.9 C)] 97.9 F (36.6 C) (04/25 0800) Pulse Rate:  [94-103] 103 (04/24 1600) Resp:  [26-32] 29 (04/24 1600) BP: (98-112)/(53-65) 112/62 (04/24 1600) SpO2:  [89 %-97 %] 89 % (04/24 1600) FiO2 (%):  [100 %] 100 % (04/25 0858)  PHYSICAL EXAMINATION Physical Exam  Constitutional: He appears well-developed.  HENT:  Head: Normocephalic and atraumatic.  Eyes: Pupils are equal, round, and reactive to light.  Neck: No JVD present. No tracheal deviation present. No thyromegaly present.  Cardiovascular:  No murmur heard. Pulmonary/Chest: No respiratory distress. He has no wheezes. He has rales.  Abdominal: He exhibits no distension.  Musculoskeletal:        General: No edema.     Cervical back: Normal range of motion.  Skin: Skin is dry.  Vitals reviewed.     Recent Labs  Lab 05/14/19 0606 05/15/19 0509 05/16/19 0355  NA 139 142 143  K 3.4* 4.3 3.7  CL 111 115* 113*  CO2 21* 22 23  BUN 27* 26* 29*  CREATININE 0.76 0.66 0.79  GLUCOSE 120* 124* 297*   Recent Labs  Lab 05/14/19 0606 05/15/19 0509 05/16/19 0355  HGB 8.3* 7.5* 8.1*  HCT 24.0* 20.7* 23.9*  WBC 3.9* 3.5* 3.4*  PLT 106* 102* 89*   NM Pulmonary Perfusion  Result Date: 05/15/2019 CLINICAL DATA:  64 year old male with COVID pneumonia and acute hypoxemic respiratory failure concerning for PE. EXAM: NUCLEAR MEDICINE PERFUSION LUNG SCAN TECHNIQUE: Perfusion images were obtained in multiple projections after  intravenous injection of radiopharmaceutical. Ventilation scans intentionally deferred if perfusion scan and chest x-Arnold adequate for interpretation during COVID 19 epidemic. RADIOPHARMACEUTICALS:  4.567 mCi Tc-65m MAA IV COMPARISON:  Chest x-Arnold 05/15/2019 FINDINGS: No peripheral wedge-shaped perfusion defects in either lung. Mildly  mottled perfusion uptake slightly worse on the right than the left consistent with known underlying multifocal pneumonia. IMPRESSION: Low probability for acute pulmonary embolus. Electronically Signed   By: Jacqulynn Cadet M.D.   On: 05/15/2019 13:54   DG Chest Port 1 View  Result Date: 05/15/2019 CLINICAL DATA:  Recent diagnosis of COVID-19.  Worsening. EXAM: PORTABLE CHEST 1 VIEW COMPARISON:  May 14, 2019 FINDINGS: Bilateral diffuse pulmonary infiltrates have worsened in the interval. Small pleural effusions. Stable cardiomediastinal silhouette. No pneumothorax. IMPRESSION: Worsening bilateral pulmonary infiltrates consistent with the patient's known pneumonia Electronically Signed   By: Dorise Bullion III M.D   On: 05/15/2019 11:13    ASSESSMENT / PLAN:  Acute hypoxic respiratory failure secondary to metabolic acidosis and [id number] pneumonia Additional complicating factor of MRSA bacteremia Supplemental O2 for dyspnea and/or hypoxia  Maintain O2 sats 88% and above  Continue remdesivir last dose (4/20), decrease steroids, switch to dexamethasone, and vitamin regimen  Continue vancomycin, discontinue linezolid due to precipitous drop in platelets PRN bronchodilator therapy  Aggressive pulmonary hygiene and OOB to chair as tolerated Limited 2D echo showed no valvular vegetations will need TEE at a later time    Septic shock secondary to COVID-19 pneumonia and MRSA bacteremia Mildly elevated troponin secondary to demand ischemia in the setting of acute respiratory failure  Hx: HTN and Hyperlipidemia  Continuous telemetry monitoring  Troponins have leveled off Off of Neo-Synephrine On midodrine Discontinue stress dose steroids -continue antimicrobials as per ID  Acute renal failure  Lactic and metabolic acidosis  Trend BMP and lactic acid Replace electrolytes as indicated  Monitor UOP Avoid nephrotoxic medications as able Trend lactic acid on downward trend   Possible  HITS     t3 score moderate    - more likely due to sepsis and covid19     - on argatroban empirically     -platelets trending up      Anemia without obvious signs of bleeding  VTE px: Had been switched to Lovenox, because of thrombocytopenia assessing HIT panel Trend CBC  Monitor for s/sx of bleeding and transfuse for hgb <7  CT Abd Pelvis 04/10: concerning for high suspicion for low-grade lymphoproliferative disorder  GERD  SUP px: iv pepcid  Oncology recommending once COVID-19 pneumonia resolves pt will need PET scan and and biopsy in the outpatient setting   Type II Diabetes Mellitus Poor control CBG q4hrs  SSI  Adjusted insulin Discontinue stress dose steroids switch to single dose dexamethasone  Severe protein calorie malnutrition Severe hypoalbuminemia Taking p.o. now dysphagia diet 1 Albumin 1.7 Supplementing albumin   Acute encephalopathy likely secondary to sepsis  COVID-19/MRSA associated encephalopathy Frequent reorientation Avoid sedating medications when possible  Continues to improve  Polysubstance abuse Positive UDS for cocaine and opiates Query IV drug use, patient evasive as to form of use May explain MRSA bacteremia Counseling once able to participate  Best Practice: VTE px: Switched to argatroban due to thrombocytopenia, HIT work-up in progress SUP px: iv pepcid  Diet: Per speech dysphagia 1 diet, patient tolerating   Critical care provider statement:    Critical care time (minutes):  34   Critical care time was exclusive of:  Separately billable procedures and  treating other patients  Critical care was necessary to treat or prevent imminent or  life-threatening deterioration of the following conditions:  acute hypoxemic respiratory failure, MRSA bacteremia, multiple comorbid conditions.    Critical care was time spent personally by me on the following  activities:  Development of treatment plan with patient or surrogate,  discussions with  consultants, evaluation of patient's response to  treatment, examination of patient, obtaining history from patient or  surrogate, ordering and performing treatments and interventions, ordering  and review of laboratory studies and re-evaluation of patient's condition   I assumed direction of critical care for this patient from another  provider in my specialty: no      Ottie Glazier, M.D.  Pulmonary & [place]

## 2019-05-17 DIAGNOSIS — F1490 Cocaine use, unspecified, uncomplicated: Secondary | ICD-10-CM

## 2019-05-17 DIAGNOSIS — D696 Thrombocytopenia, unspecified: Secondary | ICD-10-CM

## 2019-05-17 DIAGNOSIS — I5021 Acute systolic (congestive) heart failure: Secondary | ICD-10-CM

## 2019-05-17 DIAGNOSIS — Z9911 Dependence on respirator [ventilator] status: Secondary | ICD-10-CM

## 2019-05-17 DIAGNOSIS — R339 Retention of urine, unspecified: Secondary | ICD-10-CM

## 2019-05-17 DIAGNOSIS — J1282 Pneumonia due to coronavirus disease 2019: Secondary | ICD-10-CM

## 2019-05-17 LAB — BLOOD GAS, ARTERIAL
Acid-base deficit: 3.2 mmol/L — ABNORMAL HIGH (ref 0.0–2.0)
Bicarbonate: 24.7 mmol/L (ref 20.0–28.0)
FIO2: 100
MECHVT: 500 mL
Mechanical Rate: 22
O2 Saturation: 98.4 %
PEEP: 5 cmH2O
Patient temperature: 37
pCO2 arterial: 55 mmHg — ABNORMAL HIGH (ref 32.0–48.0)
pH, Arterial: 7.26 — ABNORMAL LOW (ref 7.350–7.450)
pO2, Arterial: 128 mmHg — ABNORMAL HIGH (ref 83.0–108.0)

## 2019-05-17 LAB — COMPREHENSIVE METABOLIC PANEL
ALT: 23 U/L (ref 0–44)
AST: 26 U/L (ref 15–41)
Albumin: 1.1 g/dL — ABNORMAL LOW (ref 3.5–5.0)
Alkaline Phosphatase: 293 U/L — ABNORMAL HIGH (ref 38–126)
Anion gap: 4 — ABNORMAL LOW (ref 5–15)
BUN: 37 mg/dL — ABNORMAL HIGH (ref 8–23)
CO2: 25 mmol/L (ref 22–32)
Calcium: 7.8 mg/dL — ABNORMAL LOW (ref 8.9–10.3)
Chloride: 112 mmol/L — ABNORMAL HIGH (ref 98–111)
Creatinine, Ser: 1.11 mg/dL (ref 0.61–1.24)
GFR calc Af Amer: >60 mL/min (ref >60)
GFR calc non Af Amer: >60 mL/min (ref >60)
Glucose, Bld: 90 mg/dL (ref 70–99)
Potassium: 4 mmol/L (ref 3.5–5.1)
Sodium: 141 mmol/L (ref 135–145)
Total Bilirubin: 1.2 mg/dL (ref 0.3–1.2)
Total Protein: 6 g/dL — ABNORMAL LOW (ref 6.5–8.1)

## 2019-05-17 LAB — GLUCOSE, CAPILLARY
Glucose-Capillary: 109 mg/dL — ABNORMAL HIGH (ref 70–99)
Glucose-Capillary: 84 mg/dL (ref 70–99)
Glucose-Capillary: 105 mg/dL — ABNORMAL HIGH (ref 70–99)
Glucose-Capillary: 87 mg/dL (ref 70–99)

## 2019-05-17 LAB — CBC WITH DIFFERENTIAL/PLATELET
Abs Immature Granulocytes: 0.01 10*3/uL (ref 0.00–0.07)
Basophils Absolute: 0 10*3/uL (ref 0.0–0.1)
Basophils Relative: 1 %
Eosinophils Absolute: 0 10*3/uL (ref 0.0–0.5)
Eosinophils Relative: 1 %
HCT: 23.3 % — ABNORMAL LOW (ref 39.0–52.0)
Hemoglobin: 7.7 g/dL — ABNORMAL LOW (ref 13.0–17.0)
Immature Granulocytes: 0 %
Lymphocytes Relative: 6 %
Lymphs Abs: 0.3 10*3/uL — ABNORMAL LOW (ref 0.7–4.0)
MCH: 30.3 pg (ref 26.0–34.0)
MCHC: 33 g/dL (ref 30.0–36.0)
MCV: 91.7 fL (ref 80.0–100.0)
Monocytes Absolute: 0.1 10*3/uL (ref 0.1–1.0)
Monocytes Relative: 2 %
Neutro Abs: 3.7 10*3/uL (ref 1.7–7.7)
Neutrophils Relative %: 90 %
Platelets: 59 10*3/uL — ABNORMAL LOW (ref 150–400)
RBC: 2.54 MIL/uL — ABNORMAL LOW (ref 4.22–5.81)
RDW: 14.6 % (ref 11.5–15.5)
WBC Morphology: INCREASED
WBC: 4.1 10*3/uL (ref 4.0–10.5)
nRBC: 0 % (ref 0.0–0.2)

## 2019-05-17 LAB — MAGNESIUM: Magnesium: 1.9 mg/dL (ref 1.7–2.4)

## 2019-05-17 LAB — PHOSPHORUS: Phosphorus: 4.9 mg/dL — ABNORMAL HIGH (ref 2.5–4.6)

## 2019-05-17 LAB — PROCALCITONIN: Procalcitonin: 6.8 ng/mL

## 2019-05-17 LAB — VANCOMYCIN, TROUGH: Vancomycin Tr: 18 ug/mL (ref 15–20)

## 2019-05-17 MED ORDER — CHLORHEXIDINE GLUCONATE 0.12% ORAL RINSE (MEDLINE KIT)
15.0000 mL | Freq: Two times a day (BID) | OROMUCOSAL | Status: DC
  Administered 2019-05-18 – 2019-05-19 (×4): 15 mL via OROMUCOSAL

## 2019-05-17 MED ORDER — ORAL CARE MOUTH RINSE
15.0000 mL | OROMUCOSAL | Status: DC
  Administered 2019-05-18 – 2019-05-19 (×14): 15 mL via OROMUCOSAL

## 2019-05-17 MED ORDER — ADULT MULTIVITAMIN W/MINERALS CH
1.0000 | ORAL_TABLET | Freq: Every day | ORAL | Status: DC
  Administered 2019-05-18 – 2019-05-19 (×2): 1
  Filled 2019-05-17 (×2): qty 1

## 2019-05-17 MED ORDER — VANCOMYCIN HCL 1250 MG/250ML IV SOLN
1250.0000 mg | INTRAVENOUS | Status: DC
  Administered 2019-05-17 – 2019-05-18 (×2): 1250 mg via INTRAVENOUS
  Filled 2019-05-17 (×3): qty 250

## 2019-05-17 MED ORDER — VITAL HIGH PROTEIN PO LIQD
1000.0000 mL | ORAL | Status: DC
  Administered 2019-05-17 – 2019-05-18 (×2): 1000 mL

## 2019-05-17 NOTE — Progress Notes (Signed)
PT Cancellation Note  Patient Details Name: Victor Arnold. MRN: [id number] DOB: September 11, 1955   Cancelled Treatment:    Reason Eval/Treat Not Completed: Patient not medically ready(Per chart review, decline in resiratory status, intubated ~12 hours ago. Due to several days of medical decline, will cancel PT order. Please consult our services once pt is medically ready.)  8:53 AM, 05/17/19 Etta Grandchild, PT, DPT Physical Therapist - [facility]  [phone] ([place])    [place] [facility] 05/17/2019, 8:53 AM

## 2019-05-17 NOTE — Progress Notes (Signed)
Name: Victor Arnold. MRN: [id number] DOB: Oct 23, 1955    ADMISSION DATE:  05/15/2019 INITIAL CONSULTATION DATE: 04/27/2019  REFERRING MD : Dr. Owens Shark   CHIEF COMPLAINT: Shortness of Breath   BRIEF PATIENT DESCRIPTION:  64 yo male with recent CT Abd Pelvis results on 04/10 concerning for high suspicion of low-grade lymphoproliferative disorder oncology recommended once COVID-19 pneumonia resolves pt will need PET scan and biopsy in the outpatient setting.  He was recently hospitalized and diagnosed with COVID-19 on 04/10 but left AMA on 04/14.  He is currently admitted with septic shock and acute hypoxic respiratory failure secondary to COVID-19 pneumonia and acute renal failure with metabolic/lactic acidosis requiring levophed gtt   SIGNIFICANT EVENTS/STUDIES:  04/15: Pt admitted to ICU with COVID-19 pneumonia  +COCAINE  04/16: MRSA bacteremia, encephalopathy 04/17: Mentation clearing, no increase in oxygen requirement 4/17 ECHO Left ventricular ejection fraction, by estimation, is 45%. Left ventricular diastolic parameters  are consistent with Grade I diastolic  dysfunction (impaired relaxation).  04/18: Worsening thrombocytopenia, work-up for HIT, switched to argatroban, discontinued linezolid 4/23- patient states he is feeling better, wanted me to help re-arrange bed for comfort during exam otherwise no complaints today 4/24- patient is similar to yesterday , we had V/q scan done today with no PE found. Discussed care plan with ID , am holidng off on Actemra due to MRSA bacteremia. 4/25- patient is awake, sitting up in bed, speaking in no distress. Will wean HFNC and optimize for downgrade to medical floor today.   4/26 patient with severe resp failure, ETT and MV support Intubated, critically ill  CULTURES:  COVID-19 04/10>>positive  MRSA PCR 04/15>> positive Blood x2 04/15>> positive,  MRSA Urine 04/15>> positive, MRSA   . sodium chloride Stopped (05/09/2019 0641)  . sodium  chloride Stopped (05/14/19 0947)  . sodium chloride 250 mL (05/16/19 2113)  . ampicillin-sulbactam (UNASYN) IV 3 g (05/17/19 0811)  . fentaNYL infusion INTRAVENOUS 100 mcg/hr (05/17/19 0300)  . norepinephrine (LEVOPHED) Adult infusion 2 mcg/min (05/16/19 2304)  . norepinephrine (LEVOPHED) Adult infusion Stopped (05/16/19 2307)  . propofol (DIPRIVAN) infusion 30 mcg/kg/min (05/17/19 0802)  . vancomycin Stopped (05/16/19 1141)    No Active Allergies  CC Follow up resp failure COVID 19 pneumonia  HPI Severe resp failure Severe pneumonia fio2 at 65%, intubated last night  Vent Mode: PRVC FiO2 (%):  [65 %-100 %] 65 % Set Rate:  [18 bmp-26 bmp] 26 bmp Vt Set:  [500 mL] 500 mL PEEP:  [5 cmH20-8 cmH20] 5 cmH20 Plateau Pressure:  [23 cmH20-26 cmH20] 23 cmH20   VITAL SIGNS: Temp:  [97.5 F (36.4 C)-98.7 F (37.1 C)] 97.6 F (36.4 C) (04/26 0800) Pulse Rate:  [73-102] 73 (04/26 0800) Resp:  [22-29] 26 (04/26 0800) BP: (102-113)/(59-73) 113/61 (04/26 0800) SpO2:  [90 %-99 %] 99 % (04/26 0800) FiO2 (%):  [65 %-100 %] 65 % (04/26 0811)  REVIEW OF SYSTEMS  PATIENT IS UNABLE TO PROVIDE COMPLETE REVIEW OF SYSTEM S DUE TO SEVERE CRITICAL ILLNESS AND ENCEPHALOPATHY   PHYSICAL EXAMINATION:  GENERAL:critically ill appearing, +resp distress HEAD: Normocephalic, atraumatic.  EYES: Pupils equal, round, reactive to light.  No scleral icterus.  MOUTH: Moist mucosal membrane. NECK: Supple. No thyromegaly. No nodules. No JVD.  PULMONARY: +rhonchi, +wheezing CARDIOVASCULAR: S1 and S2. Regular rate and rhythm. No murmurs, rubs, or gallops.  GASTROINTESTINAL: Soft, nontender, -distended. Positive bowel sounds.  MUSCULOSKELETAL: No swelling, clubbing, or edema.  NEUROLOGIC: obtunded SKIN:intact,warm,dry      Recent Labs  Lab 05/15/19 0509 05/16/19 0355 05/17/19 0512  NA 142 143 141  K 4.3 3.7 4.0  CL 115* 113* 112*  CO2 22 23 25   BUN 26* 29* 37*  CREATININE 0.66 0.79 1.11   GLUCOSE 124* 297* 90   Recent Labs  Lab 05/15/19 0509 05/16/19 0355 05/17/19 0512  HGB 7.5* 8.1* 7.7*  HCT 20.7* 23.9* 23.3*  WBC 3.5* 3.4* 4.1  PLT 102* 89* 59*   DG Abd 1 View  Result Date: 05/16/2019 CLINICAL DATA:  Orogastric tube placement. EXAM: ABDOMEN - 1 VIEW COMPARISON:  May 03, 2019 FINDINGS: Marked severity infiltrates are seen within the visualized portions of the bilateral lung bases. A nasogastric tube is seen with its distal tip overlying the expected region of the gastric antrum. The bowel gas pattern is normal. A large amount of stool is seen throughout the colon. No radio-opaque calculi or other significant radiographic abnormality are seen. IMPRESSION: 1. Nasogastric tube positioning, as described above. 2. Marked severity bibasilar infiltrates. 3. Large stool burden without evidence of bowel obstruction. Electronically Signed   By: Virgina Norfolk M.D.   On: 05/16/2019 21:43   NM Pulmonary Perfusion  Result Date: 05/15/2019 CLINICAL DATA:  64 year old male with COVID pneumonia and acute hypoxemic respiratory failure concerning for PE. EXAM: NUCLEAR MEDICINE PERFUSION LUNG SCAN TECHNIQUE: Perfusion images were obtained in multiple projections after intravenous injection of radiopharmaceutical. Ventilation scans intentionally deferred if perfusion scan and chest x-ray adequate for interpretation during COVID 19 epidemic. RADIOPHARMACEUTICALS:  4.567 mCi Tc-41m MAA IV COMPARISON:  Chest x-ray 05/15/2019 FINDINGS: No peripheral wedge-shaped perfusion defects in either lung. Mildly mottled perfusion uptake slightly worse on the right than the left consistent with known underlying multifocal pneumonia. IMPRESSION: Low probability for acute pulmonary embolus. Electronically Signed   By: Jacqulynn Cadet M.D.   On: 05/15/2019 13:54   DG Chest Port 1 View  Result Date: 05/16/2019 CLINICAL DATA:  Central line placement. EXAM: PORTABLE CHEST 1 VIEW COMPARISON:  May 16, 2019  FINDINGS: Since the prior study there is been interval placement of a left internal jugular venous catheter. Its distal tip is seen overlying the midportion of the superior vena cava. There is stable endotracheal tube and nasogastric tube positioning. Marked severity patchy bilateral infiltrates are seen the. Given differences in technique this is predominant stable in severity when compared to the prior study. Small bilateral pleural effusions are noted. No pneumothorax is identified. The heart size and mediastinal contours are within normal limits. Degenerative changes seen within the mid and lower thoracic spine. IMPRESSION: 1. Interval placement of left internal jugular venous catheter, as described above, without evidence of pneumothorax. 2. Marked severity bilateral infiltrates, stable in severity when compared to the prior study. Electronically Signed   By: Virgina Norfolk M.D.   On: 05/16/2019 21:44   DG Chest Port 1 View  Result Date: 05/16/2019 CLINICAL DATA:  Respiratory failure. EXAM: PORTABLE CHEST 1 VIEW COMPARISON:  05/15/2019 FINDINGS: An endotracheal tube with tip 5.5 cm above the carina is now noted. An NG tube is identified with tip overlying the distal stomach. Diffuse bilateral airspace opacities are slightly increased. No pneumothorax or large pleural effusion identified. IMPRESSION: Endotracheal tube and NG tube placement with slight increase in diffuse bilateral airspace opacities. Electronically Signed   By: Margarette Canada M.D.   On: 05/16/2019 20:38   DG Chest Port 1 View  Result Date: 05/15/2019 CLINICAL DATA:  Recent diagnosis of COVID-19.  Worsening. EXAM: PORTABLE CHEST 1 VIEW COMPARISON:  May 14, 2019 FINDINGS: Bilateral diffuse pulmonary infiltrates have worsened in the interval. Small pleural effusions. Stable cardiomediastinal silhouette. No pneumothorax. IMPRESSION: Worsening bilateral pulmonary infiltrates consistent with the patient's known pneumonia Electronically  Signed   By: Dorise Bullion III M.D   On: 05/15/2019 11:13    ASSESSMENT / PLAN:  Severe ACUTE Hypoxic and Hypercapnic Respiratory Failure from COVID 19 pneumonia with progressive fibrotic phase with underlying COCAINE ABUSE AND MRSA BACTEREMIA -continue Mechanical Ventilator support -continue Bronchodilator Therapy -Wean Fio2 and PEEP as tolerated -VAP/VENT bundle implementation Severe hypoxia-unable to wean   Severe COVID-19 infection, ARDS and pneumonia/pneumonitis Continue IV steroids  Completed remdisivir  proning as tolerated due to severe hypoxia   Maintain airborne and contact precautions  As needed bronchodilators (MDI) Vitamin C and zinc Antitussives High risk for death  ACUTE SYSTOLIC CARDIAC FAILURE- EF 45% -oxygen as needed -Lasix as tolerated   ACUTE ANEMIA- TRANSFUSE AS NEEDED DVT PRX with TED/SCD's ONLY   ENDO - ICU hypoglycemic\Hyperglycemia protocol -check FSBS per protocol  Severe protein malnutrition   Polysubstance abuse Positive UDS for cocaine and opiates  OVERALL POOR PROGNOSIS Critical Care Time devoted to patient care services described in this note is 34 minutes.   Overall, patient is critically ill, prognosis is guarded.  Patient with Multiorgan failure and at high risk for cardiac arrest and death.    Corrin Parker, M.D.  [facility]  Medical Director [place] Director [facility] Cardio-Pulmonary Department

## 2019-05-17 NOTE — Progress Notes (Addendum)
Clinical status relayed to family [place] sister of patient at [phone]  Updated and notified of patients medical condition- Multiorgan failure  Progressive multiorgan failure with very low chance of meaningful recovery.  Family understands the situation.  The family has agreed and consented to DNR status  Family are satisfied with Plan of action and management. All questions answered  Additional CC time 30 mins Kenneshia Rehm Patricia Pesa, M.D.  [facility]  Medical Director [place] Director [facility] Cardio-Pulmonary Department

## 2019-05-17 NOTE — Progress Notes (Signed)
Pharmacy Antibiotic Note  Victor Arnold. is a 64 y.o. male admitted on 05/13/2019. Patient recently diagnosed with COVID, presented with worsening condition per family. Patient was hypotensive requiring vasopressors, also started on stress dose steroids in ICU. Pharmacy has been consulted for vancomycin dosing for positive blood cultures. ID started linezolid for possible MRSA PNA (now stopped due low platelets) but patient only received 3 doses on linezolid and platelet count already trending down.    Blood cultures with MRSA in 4/4 bottles. Hx drug abuse- Urine tox positive for cocaine/opiates  Day #13 vancomycin - Patient intubated overnight 4/25-26 (also brief need for norepinephrine) - Repeat blood cultures 4/19 = No growth - WBC WNL - SCr 1.11, inc from 0.79 4/25 - 4/17 TTE no evidence of endocarditis - 4/26 @ 0939 Vancomycin trough = 18 mcg/mL - PREVIOUS vancomycin levels performed on 4/19 dose (on 1500mg  IV q24h @ 0942)  - vanco peak = 29 mcg/mL at 12:51  - random vanco = 13 mcg/mL at 05:18  - calculated AUC = 455 (trough = 10.5)  Plan:  Increased SCr overnight with clinical decline noted (intubated and need for vasopressors).  Vancomycin trough higher than predicted but not supratherapeutic.  Reduce dose to 1250mg  IV q24h - give dose this afternoon.  F/u SCr in am  Ampicillin/sulbactam 3gm IV q6h remains appropriate  Follow renal function  Await plan for TEE?   Height: 5\' 11"  (180.3 cm) Weight: 81.3 kg (179 lb 3.7 oz) IBW/kg (Calculated) : 75.3  Temp (24hrs), Avg:98 F (36.7 C), Min:97.5 F (36.4 C), Max:98.7 F (37.1 C)  Recent Labs  Lab 05/10/19 1251 05/11/19 0518 05/12/19 0416 05/13/19 0626 05/14/19 0606 05/15/19 0509 05/16/19 0355 05/17/19 0512 05/17/19 0939  WBC  --  4.7   < > 3.8* 3.9* 3.5* 3.4* 4.1  --   CREATININE  --  0.84   < > 0.80 0.76 0.66 0.79 1.11  --   VANCOTROUGH  --  13*  --   --   --   --   --   --  18  VANCOPEAK 29*  --   --   --    --   --   --   --   --    < > = values in this interval not displayed.    Estimated Creatinine Clearance: 70.7 mL/min (by C-G formula based on SCr of 1.11 mg/dL).    No Active Allergies  Antimicrobials this admission: Vancomycin 4/15 >> Ceftriaxone 4/15 >>4/16 Azithromycin 4/15 >> Linezolid 4/17>> 4/18 Unasyn 4/22>> 4/23,  4/24 >>  Dose adjustments this admission: NA  Microbiology results: 4/15 BCx: 4/4 MRSA 4/15 UCx: MRSA   4/15 MRSA PCR: positive 4/19 Bcx: NGTD  Thank you for allowing pharmacy to be a part of this patient's care.  Doreene Eland, PharmD, BCPS.   Work Cell: [phone] 05/17/2019 10:39 AM

## 2019-05-17 NOTE — Progress Notes (Signed)
[facility] for Electrolyte Monitoring and Replacement   Recent Labs: Potassium (mmol/L)  Date Value  05/17/2019 4.0   Magnesium (mg/dL)  Date Value  05/17/2019 1.9   Calcium (mg/dL)  Date Value  05/17/2019 7.8 (L)   Albumin (g/dL)  Date Value  05/17/2019 1.1 (L)   Phosphorus (mg/dL)  Date Value  05/17/2019 4.9 (H)   Sodium (mmol/L)  Date Value  05/17/2019 141   Corrected Calcium: 8.34  Assessment: 64 year old male admitted after recent diagnosis of COVID and worsening condition. Patient was admitted to ICU and started on pressors.   Goal of Therapy:  Electrolytes WNL  Plan:  K 4.0  Mag 1.9  Phos 4.9  Scr 1.11 No supplementation at this time Will follow up electrolytes with morning labs and replace as indicated.  Paulina Fusi, PharmD, BCPS 05/17/2019 2:52 PM

## 2019-05-17 NOTE — Progress Notes (Signed)
Initial Nutrition Assessment  DOCUMENTATION CODES:   Not applicable  INTERVENTION:  Initiate Vital High Protein at 15 mL/hr and advance by 20 mL/hr every 8 hours to goal rate of 55 mL/hr per tube (1320 mL goal daily volume). Provides 1320 kcal, 116 grams of protein, 1109 mL H2O daily. With current propofol rate provides 1703 kcal daily.  Provide MVI daily per tube.  Provide minimum free water flush of 30 mL Q4hrs to maintain tube patency.  NUTRITION DIAGNOSIS:   Inadequate oral intake related to inability to eat as evidenced by NPO status.  GOAL:   Patient will meet greater than or equal to 90% of their needs  MONITOR:   Vent status, Labs, Weight trends, TF tolerance, Skin, I & O's  REASON FOR ASSESSMENT:   Ventilator    ASSESSMENT:   64 year old male with PMHx of HTN, GERD, DM, recent CT abdomen/pelvis results on 4/10 concerning for high suspicion of low-grade lymphoproliferative disorder, recent hospitalized and diagnosed with COVID-19 on 4/10 but left AMA on 4/14, who was re-admitted on 4/15 for septic shock, acute hypoxic respiratory failure secondary to COVID-19 PNA, AKI. Patient was intubated on 4/25.   Patient is currently intubated on ventilator support MV: 12.9 L/min Temp (24hrs), Avg:98 F (36.7 C), Min:97.5 F (36.4 C), Max:98.7 F (37.1 C)  Propofol: 14.5 ml/hr (383 kcal daily)  Medications reviewed and include: Decadron 6 mg daily, famotidine, Novolog 0-15 units TID, Novolog 0-5 units QHS, Novolog 5 units TID, Lantus 12 units daily, Unasyn, fentanyl gtt, propofol gtt, vancomycin, norepinephrine gtt at 6 mcg/min.  Labs reviewed: CBG 109-267, Chloride 112, BUN 37, Anion gap 4, Phosphorus 4.9.  Enteral Access: OGT  I/O: 1150 mL UOP yesterday (0.6 mL/kg/hr)  Discussed with RN and on rounds. Plan is to start tube feeds today. According to chart patient was eating 50-100% of meals prior to intubation. Weight appears fairly stable per chart.  Diet Order:    Diet Order            Diet NPO time specified  Diet effective now             EDUCATION NEEDS:   No education needs have been identified at this time  Skin:  Skin Assessment: Skin Integrity Issues:(stg 2 left buttocks)  Last BM:  05/17/2019 large type 6  Height:   Ht Readings from Last 1 Encounters:  05/18/2019 5\' 11"  (1.803 m)   Weight:   Wt Readings from Last 1 Encounters:  05/10/19 81.3 kg   Ideal Body Weight:  78.2 kg  BMI:  Body mass index is 25 kg/m.  Estimated Nutritional Needs:   Kcal:  1626  Protein:  105-120 grams  Fluid:  >/=2 L/day  Jacklynn Barnacle, MS, RD, LDN Pager number available on Amion

## 2019-05-17 NOTE — Progress Notes (Signed)
SLP Cancellation Note  Patient Details Name: Victor Arnold. MRN: [id number] DOB: [birth year]   Cancelled treatment:       Reason Eval/Treat Not Completed: Medical issues which prohibited therapy;Patient not medically ready(chart reviewed). Per chart notes and visiting at room, noted pt had a decline in resiratory status and is now orally intubated. ST services will sign off; MD to reconsult when pt is medically appropriate. Noted MD notes re: pt's status.    Orinda Kenner, MS, CCC-SLP [place] 05/17/2019, 12:27 PM

## 2019-05-17 NOTE — Progress Notes (Signed)
Date of Admission:  05/09/2019              LDA CVC triple lumen left IJ 05/16/19 Intubated-05/16/19 Foley 05/18/2019  Intubated Sedated on 1 pressor  Medications:  . albuterol  2 puff Inhalation Q6H  . chlorhexidine  15 mL Mouth Rinse BID  . Chlorhexidine Gluconate Cloth  6 each Topical Q0600  . dexamethasone  6 mg Oral Daily  . enoxaparin (LOVENOX) injection  40 mg Subcutaneous Q24H  . famotidine  20 mg Oral BID  . fludrocortisone  0.2 mg Oral BID  . insulin aspart  0-15 Units Subcutaneous TID WC  . insulin aspart  0-5 Units Subcutaneous QHS  . insulin aspart  6 Units Subcutaneous TID WC  . insulin glargine  12 Units Subcutaneous Daily  . mouth rinse  15 mL Mouth Rinse q12n4p  . melatonin  10 mg Oral QHS  . midodrine  10 mg Oral TID WC  . [START ON 05/18/2019] multivitamin with minerals  1 tablet Per Tube Daily    Objective: Vital signs in last 24 hours: Temp:  [97.5 F (36.4 C)-98.7 F (37.1 C)] 97.6 F (36.4 C) (04/26 1200) Pulse Rate:  [70-101] 70 (04/26 1200) Resp:  [22-26] 26 (04/26 1200) BP: (102-117)/(59-73) 117/61 (04/26 1200) SpO2:  [91 %-99 %] 94 % (04/26 1200) FiO2 (%):  [50 %-100 %] 50 % (04/26 1111)  PHYSICAL EXAM:  General: intubated and sedated Lungs: b/la ir entry Heart: s1s2 Abdomen: Soft,  Extremities edema arms and legs Skin: No rashes or lesions. Or bruising Lymph: Cervical, supraclavicular normal. Neurologic: cannot be assessed OF tube Left IJ Foley  Lab Results   CBC Latest Ref Rng & Units 05/17/2019 05/16/2019 05/15/2019  WBC 4.0 - 10.5 K/uL 4.1 3.4(L) 3.5(L)  Hemoglobin 13.0 - 17.0 g/dL 7.7(L) 8.1(L) 7.5(L)  Hematocrit 39.0 - 52.0 % 23.3(L) 23.9(L) 20.7(L)  Platelets 150 - 400 K/uL 59(L) 89(L) 102(L)   Platelet  Recent Labs    05/16/19 0355 05/17/19 0512  WBC 3.4* 4.1  HGB 8.1* 7.7*  HCT 23.9* 23.3*  NA 143 141  K 3.7 4.0  CL 113* 112*  CO2 23 25  BUN 29* 37*  CREATININE 0.79 1.11   Liver Panel Recent Labs   05/16/19 0355 05/17/19 0512  PROT 6.0* 6.0*  ALBUMIN 1.3* 1.1*  AST 30 26  ALT 25 23  ALKPHOS 375* 293*  BILITOT 1.6* 1.2   Sedimentation Rate No results for input(s): ESRSEDRATE in the last 72 hours. C-Reactive Protein Recent Labs    05/15/19 1609  CRP 18.7*    Microbiology:  Studies/Results: DG Abd 1 View  Result Date: 05/16/2019 CLINICAL DATA:  Orogastric tube placement. EXAM: ABDOMEN - 1 VIEW COMPARISON:  May 03, 2019 FINDINGS: Marked severity infiltrates are seen within the visualized portions of the bilateral lung bases. A nasogastric tube is seen with its distal tip overlying the expected region of the gastric antrum. The bowel gas pattern is normal. A large amount of stool is seen throughout the colon. No radio-opaque calculi or other significant radiographic abnormality are seen. IMPRESSION: 1. Nasogastric tube positioning, as described above. 2. Marked severity bibasilar infiltrates. 3. Large stool burden without evidence of bowel obstruction. Electronically Signed   By: Virgina Norfolk M.D.   On: 05/16/2019 21:43   NM Pulmonary Perfusion  Result Date: 05/15/2019 CLINICAL DATA:  64 year old male with COVID pneumonia and acute hypoxemic respiratory failure concerning for PE. EXAM: NUCLEAR MEDICINE PERFUSION LUNG SCAN TECHNIQUE: Perfusion images  were obtained in multiple projections after intravenous injection of radiopharmaceutical. Ventilation scans intentionally deferred if perfusion scan and chest x-ray adequate for interpretation during COVID 19 epidemic. RADIOPHARMACEUTICALS:  4.567 mCi Tc-66m MAA IV COMPARISON:  Chest x-ray 05/15/2019 FINDINGS: No peripheral wedge-shaped perfusion defects in either lung. Mildly mottled perfusion uptake slightly worse on the right than the left consistent with known underlying multifocal pneumonia. IMPRESSION: Low probability for acute pulmonary embolus. Electronically Signed   By: Jacqulynn Cadet M.D.   On: 05/15/2019 13:54   DG  Chest Port 1 View  Result Date: 05/16/2019 CLINICAL DATA:  Central line placement. EXAM: PORTABLE CHEST 1 VIEW COMPARISON:  May 16, 2019 FINDINGS: Since the prior study there is been interval placement of a left internal jugular venous catheter. Its distal tip is seen overlying the midportion of the superior vena cava. There is stable endotracheal tube and nasogastric tube positioning. Marked severity patchy bilateral infiltrates are seen the. Given differences in technique this is predominant stable in severity when compared to the prior study. Small bilateral pleural effusions are noted. No pneumothorax is identified. The heart size and mediastinal contours are within normal limits. Degenerative changes seen within the mid and lower thoracic spine. IMPRESSION: 1. Interval placement of left internal jugular venous catheter, as described above, without evidence of pneumothorax. 2. Marked severity bilateral infiltrates, stable in severity when compared to the prior study. Electronically Signed   By: Virgina Norfolk M.D.   On: 05/16/2019 21:44   DG Chest Port 1 View  Result Date: 05/16/2019 CLINICAL DATA:  Respiratory failure. EXAM: PORTABLE CHEST 1 VIEW COMPARISON:  05/15/2019 FINDINGS: An endotracheal tube with tip 5.5 cm above the carina is now noted. An NG tube is identified with tip overlying the distal stomach. Diffuse bilateral airspace opacities are slightly increased. No pneumothorax or large pleural effusion identified. IMPRESSION: Endotracheal tube and NG tube placement with slight increase in diffuse bilateral airspace opacities. Electronically Signed   By: Margarette Canada M.D.   On: 05/16/2019 20:38     Assessment/Plan: [facility] 4/15 MRSA BC- 4/19 - NG 4/15 UC-MRSA  CXR fom 4/24 Worsening bilateral pulmonary infiltrates consistent with the patient's known pneumonia  Impression/recommendation  Acute hypoxic resp failure- pt intbated on 4/25 covid pneumonia/ARDS/ possible MRSA  pneumonia   MRSA bacteremia -on vancomycin- because of b/l lung infiltrates /b/l pneumonia MRSA superinfection was a concern and he was started on PO linezolid but it had to be stopped because of drop in platelet count after 3 doses only!! Restart Unasyn as fever after stopping -there is  concern for aspiration  One episode of fever- if it recurs repeat blood culture  Thrombocytopenia Unlikely due to linezolid as platelt started to decline from 4/16- and on 4/17 it was 128 3 am and that is the day he was started on linezolid and got 3 doses only before it was stopped  COVID pneumonia with acute hypoxic resp failure- management as per pulmonologist .    Cocaine use- A risk factor for worsening COVID pneumonitis  Chronic urinary retention-has foley Pt seriously ill Discussed the management with his nurse

## 2019-05-18 LAB — CBC WITH DIFFERENTIAL/PLATELET
Abs Immature Granulocytes: 0.07 10*3/uL (ref 0.00–0.07)
Basophils Absolute: 0 10*3/uL (ref 0.0–0.1)
Basophils Relative: 1 %
Eosinophils Absolute: 0.1 10*3/uL (ref 0.0–0.5)
Eosinophils Relative: 2 %
HCT: 22.7 % — ABNORMAL LOW (ref 39.0–52.0)
Hemoglobin: 7.7 g/dL — ABNORMAL LOW (ref 13.0–17.0)
Immature Granulocytes: 1 %
Lymphocytes Relative: 7 %
Lymphs Abs: 0.4 10*3/uL — ABNORMAL LOW (ref 0.7–4.0)
MCH: 30.3 pg (ref 26.0–34.0)
MCHC: 33.9 g/dL (ref 30.0–36.0)
MCV: 89.4 fL (ref 80.0–100.0)
Monocytes Absolute: 0.1 10*3/uL (ref 0.1–1.0)
Monocytes Relative: 1 %
Neutro Abs: 4.6 10*3/uL (ref 1.7–7.7)
Neutrophils Relative %: 88 %
Platelets: 43 10*3/uL — ABNORMAL LOW (ref 150–400)
RBC: 2.54 MIL/uL — ABNORMAL LOW (ref 4.22–5.81)
RDW: 15 % (ref 11.5–15.5)
Smear Review: DECREASED
WBC: 5.2 10*3/uL (ref 4.0–10.5)
nRBC: 0 % (ref 0.0–0.2)

## 2019-05-18 LAB — MAGNESIUM: Magnesium: 2 mg/dL (ref 1.7–2.4)

## 2019-05-18 LAB — BASIC METABOLIC PANEL
Anion gap: 7 (ref 5–15)
BUN: 48 mg/dL — ABNORMAL HIGH (ref 8–23)
CO2: 23 mmol/L (ref 22–32)
Calcium: 7.6 mg/dL — ABNORMAL LOW (ref 8.9–10.3)
Chloride: 112 mmol/L — ABNORMAL HIGH (ref 98–111)
Creatinine, Ser: 1.29 mg/dL — ABNORMAL HIGH (ref 0.61–1.24)
GFR calc Af Amer: >60 mL/min (ref >60)
GFR calc non Af Amer: 58 mL/min — ABNORMAL LOW (ref >60)
Glucose, Bld: 159 mg/dL — ABNORMAL HIGH (ref 70–99)
Potassium: 3.9 mmol/L (ref 3.5–5.1)
Sodium: 142 mmol/L (ref 135–145)

## 2019-05-18 LAB — GLUCOSE, CAPILLARY
Glucose-Capillary: 110 mg/dL — ABNORMAL HIGH (ref 70–99)
Glucose-Capillary: 115 mg/dL — ABNORMAL HIGH (ref 70–99)
Glucose-Capillary: 133 mg/dL — ABNORMAL HIGH (ref 70–99)
Glucose-Capillary: 165 mg/dL — ABNORMAL HIGH (ref 70–99)
Glucose-Capillary: 184 mg/dL — ABNORMAL HIGH (ref 70–99)
Glucose-Capillary: 67 mg/dL — ABNORMAL LOW (ref 70–99)
Glucose-Capillary: 70 mg/dL (ref 70–99)
Glucose-Capillary: 80 mg/dL (ref 70–99)
Glucose-Capillary: 81 mg/dL (ref 70–99)

## 2019-05-18 LAB — PHOSPHORUS: Phosphorus: 5 mg/dL — ABNORMAL HIGH (ref 2.5–4.6)

## 2019-05-18 MED ORDER — DEXMEDETOMIDINE HCL IN NACL 400 MCG/100ML IV SOLN
0.4000 ug/kg/h | INTRAVENOUS | Status: DC
  Administered 2019-05-18 – 2019-05-19 (×7): 1 ug/kg/h via INTRAVENOUS
  Filled 2019-05-18 (×7): qty 100

## 2019-05-18 MED ORDER — MIDODRINE HCL 5 MG PO TABS
20.0000 mg | ORAL_TABLET | Freq: Three times a day (TID) | ORAL | Status: DC
  Administered 2019-05-18 (×2): 20 mg via ORAL
  Filled 2019-05-18 (×2): qty 4

## 2019-05-18 MED ORDER — LACTATED RINGERS IV SOLN: INTRAVENOUS | Status: DC

## 2019-05-18 MED ORDER — DEXTROSE 50 % IV SOLN
25.0000 mL | Freq: Once | INTRAVENOUS | Status: AC
  Administered 2019-05-18: 25 mL via INTRAVENOUS

## 2019-05-18 NOTE — Progress Notes (Addendum)
CRITICAL CARE NOTE BRIEF PATIENT DESCRIPTION:  64 yo male with recent CT Abd Pelvis results on 04/10 concerning for high suspicion of low-grade lymphoproliferative disorder oncology recommended once COVID-19 pneumonia resolves pt will need PET scan and biopsy in the outpatient setting.  He was recently hospitalized and diagnosed with COVID-19 on 04/10 but left AMA on 04/14.  He is currently admitted with septic shock and acute hypoxic respiratory failure secondary to COVID-19 pneumonia and acute renal failure with metabolic/lactic acidosis requiring levophed gtt   SIGNIFICANT EVENTS/STUDIES:  04/15: Pt admitted to ICU with COVID-19 pneumonia  +COCAINE  04/16: MRSA bacteremia, encephalopathy 04/17: Mentation clearing, no increase in oxygen requirement 4/17 ECHO Left ventricular ejection fraction, by estimation, is 45%. Left ventricular diastolic parameters  are consistent with Grade I diastolic  dysfunction (impaired relaxation).  04/18: Worsening thrombocytopenia, work-up for HIT, switched to argatroban, discontinued linezolid 4/23- patient states he is feeling better, wanted me to help re-arrange bed for comfort during exam otherwise no complaints today 4/24- patient is similar to yesterday , we had V/q scan done today with no PE found. Discussed care plan with ID , am holidng off on Actemra due to MRSA bacteremia. 4/25- patient is awake, sitting up in bed, speaking in no distress. Will wean HFNC and optimize for downgrade to medical floor today.   4/26 patient with severe resp failure, ETT and MV support Intubated, critically ill 4/27 remains on vent, critically ill, DNR status  CULTURES:  COVID-19 04/10>>positive  MRSA PCR 04/15>> positive Blood x2 04/15>> positive,  MRSA Urine 04/15>> positive, MRSA    CC  follow up respiratory failure  SUBJECTIVE Patient remains critically ill Prognosis is guarded   Vent Mode: PRVC FiO2 (%):  [50 %] 50 % Set Rate:  [26 bmp] 26 bmp Vt Set:   [500 mL] 500 mL PEEP:  [5 cmH20] 5 cmH20 Plateau Pressure:  [22 cmH20-23 cmH20] 23 cmH20   BP (!) 83/53   Pulse 65   Temp 98 F (36.7 C) (Oral)   Resp (!) 9   Ht '5\' 11"'  (1.803 m)   Wt 84.5 kg   SpO2 93%   BMI 25.98 kg/m    I/O last 3 completed shifts: In: 2535.8 [I.V.:1015.9; NG/GT:869.9; IV Piggyback:650] Out: 2030 [Urine:2030] No intake/output data recorded.  SpO2: 93 % O2 Flow Rate (L/min): 55 L/min FiO2 (%): 50 %  Estimated body mass index is 25.98 kg/m as calculated from the following:   Height as of this encounter: '5\' 11"'  (1.803 m).   Weight as of this encounter: 84.5 kg.  BMP Latest Ref Rng & Units 05/18/2019 05/17/2019 05/16/2019  Glucose 70 - 99 mg/dL 159(H) 90 297(H)  BUN 8 - 23 mg/dL 48(H) 37(H) 29(H)  Creatinine 0.61 - 1.24 mg/dL 1.29(H) 1.11 0.79  Sodium 135 - 145 mmol/L 142 141 143  Potassium 3.5 - 5.1 mmol/L 3.9 4.0 3.7  Chloride 98 - 111 mmol/L 112(H) 112(H) 113(H)  CO2 22 - 32 mmol/L '23 25 23  ' Calcium 8.9 - 10.3 mg/dL 7.6(L) 7.8(L) 7.6(L)   CBC    Component Value Date/Time   WBC 5.2 05/18/2019 0429   RBC 2.54 (L) 05/18/2019 0429   HGB 7.7 (L) 05/18/2019 0429   HCT 22.7 (L) 05/18/2019 0429   PLT 43 (L) 05/18/2019 0429   MCV 89.4 05/18/2019 0429   MCH 30.3 05/18/2019 0429   MCHC 33.9 05/18/2019 0429   RDW 15.0 05/18/2019 0429   LYMPHSABS 0.4 (L) 05/18/2019 0429   MONOABS 0.1  05/18/2019 0429   EOSABS 0.1 05/18/2019 0429   BASOSABS 0.0 05/18/2019 0429     REVIEW OF SYSTEMS  PATIENT IS UNABLE TO PROVIDE COMPLETE REVIEW OF SYSTEMS DUE TO SEVERE CRITICAL ILLNESS   Pressure Injury 05/12/19 Buttocks Left Stage 2 -  Partial thickness loss of dermis presenting as a shallow open injury with a red, pink wound bed without slough. (Active)  05/12/19 0800  Location: Buttocks  Location Orientation: Left  Staging: Stage 2 -  Partial thickness loss of dermis presenting as a shallow open injury with a red, pink wound bed without slough.  Wound  Description (Comments):   Present on Admission:      MEDICATIONS: I have reviewed all medications and confirmed regimen as documented   CULTURE RESULTS   Recent Results (from the past 240 hour(s))  CULTURE, BLOOD (ROUTINE X 2) w Reflex to ID Panel     Status: None   Collection Time: 05/10/19  5:00 PM   Specimen: BLOOD  Result Value Ref Range Status   Specimen Description BLOOD [facility]  Final   Special Requests   Final    BOTTLES DRAWN AEROBIC AND ANAEROBIC Blood Culture adequate volume   Culture   Final    NO GROWTH 5 DAYS Performed at [facility], [address] [place], [place] [address]    Report Status 05/15/2019 FINAL  Final  CULTURE, BLOOD (ROUTINE X 2) w Reflex to ID Panel     Status: None   Collection Time: 05/10/19  5:00 PM   Specimen: BLOOD  Result Value Ref Range Status   Specimen Description BLOOD [facility]  Final   Special Requests   Final    BOTTLES DRAWN AEROBIC AND ANAEROBIC Blood Culture adequate volume   Culture   Final    NO GROWTH 5 DAYS Performed at [facility], [address], [place], [place] [address]    Report Status 05/15/2019 FINAL  Final        Indwelling Urinary Catheter continued, requirement due to   Reason to continue Indwelling Urinary Catheter strict Intake/Output monitoring for hemodynamic instability   Central Line/ continued, requirement due to  Reason to continue [place] of central venous pressure or other hemodynamic parameters and poor IV access   Ventilator continued, requirement due to severe respiratory failure   Ventilator Sedation RASS 0 to -2      ASSESSMENT AND PLAN SYNOPSIS  Severe ACUTE Hypoxic and Hypercapnic Respiratory Failure from COVID 19 pneumonia with progressive fibrotic phase with underlying COCAINE ABUSE AND MRSA BACTEREMIA  -continue Full MV support -Wean Fio2 and PEEP as tolerated -will perform SAT/SBT when respiratory parameters are met -VAP/VENT bundle  implementation  Severe COVID-19 infection, ARDS and pneumonia/pneumonitis Continue IV steroids  Completed remdisivir proning as tolerated due to severe hypoxia   Maintain airborne and contact precautions  As needed bronchodilators (MDI) Vitamin C and zinc Antitussives High risk for death    ACUTE KIDNEY INJURY/Renal Failure -follow chem 7 -follow UO -continue Foley Catheter-assess need -Avoid nephrotoxic agents -Recheck creatinine   ACUTE SYSTOLIC CARDIAC FAILURE- EF 45% -oxygen as needed -Lasix as tolerated   NEUROLOGY - intubated and sedated - minimal sedation to achieve a RASS goal: -1   CARDIAC ICU monitoring  ID -continue IV abx as prescibed -follow up cultures  GI GI PROPHYLAXIS as indicated  NUTRITIONAL STATUS Nutrition Status: Nutrition Problem: Inadequate oral intake Etiology: inability to eat Signs/Symptoms: NPO status Interventions: Tube feeding, MVI   DIET-->TF's as tolerated  Constipation protocol as indicated  ENDO - will use ICU hypoglycemic\Hyperglycemia protocol if indicated   ELECTROLYTES -follow labs as needed -replace as needed -pharmacy consultation and following   DVT/GI PRX ordered TRANSFUSIONS AS NEEDED MONITOR FSBS ASSESS the need for LABS as needed  Severe protein malnutrition   Polysubstance abuse Positive UDS for cocaine and opiates  OVERALL POOR PROGNOSIS  Critical Care Time devoted to patient care services described in this note is 35 minutes.   Overall, patient is critically ill, prognosis is guarded.  Patient with Multiorgan failure and at high risk for cardiac arrest and death.    Corrin Parker, M.D.  [facility]  Medical Director [place] Director [facility] Cardio-Pulmonary Department

## 2019-05-18 NOTE — Progress Notes (Signed)
ID   BP (!) 104/59   Pulse (!) 59   Temp 98 F (36.7 C) (Axillary)   Resp 14   Ht 5\' 11"  (1.803 m)   Wt 84.5 kg   SpO2 90%   BMI 25.98 kg/m    Remains intubated Edematous extremities LDA CVC triple lumen left IJ 05/16/19 Intubated-05/16/19 Foley 05/13/2019  Chest b/l air entry [id number] Abd soft  labs   CBC Latest Ref Rng & Units 05/18/2019 05/17/2019 05/16/2019  WBC 4.0 - 10.5 K/uL 5.2 4.1 3.4(L)  Hemoglobin 13.0 - 17.0 g/dL 7.7(L) 7.7(L) 8.1(L)  Hematocrit 39.0 - 52.0 % 22.7(L) 23.3(L) 23.9(L)  Platelets 150 - 400 K/uL 43(L) 59(L) 89(L)     CMP Latest Ref Rng & Units 05/18/2019 05/17/2019 05/16/2019  Glucose 70 - 99 mg/dL 159(H) 90 297(H)  BUN 8 - 23 mg/dL 48(H) 37(H) 29(H)  Creatinine 0.61 - 1.24 mg/dL 1.29(H) 1.11 0.79  Sodium 135 - 145 mmol/L 142 141 143  Potassium 3.5 - 5.1 mmol/L 3.9 4.0 3.7  Chloride 98 - 111 mmol/L 112(H) 112(H) 113(H)  CO2 22 - 32 mmol/L 23 25 23   Calcium 8.9 - 10.3 mg/dL 7.6(L) 7.8(L) 7.6(L)  Total Protein 6.5 - 8.1 g/dL - 6.0(L) 6.0(L)  Total Bilirubin 0.3 - 1.2 mg/dL - 1.2 1.6(H)  Alkaline Phos 38 - 126 U/L - 293(H) 375(H)  AST 15 - 41 U/L - 26 30  ALT 0 - 44 U/L - 23 25     BC 4/15 MRSA BC- 4/19 - NG 4/15 UC-MRSA  CXRWorsening bilateral pulmonary infiltrates consistent with the patient's known pneumonia  Impression/recommendation  Acute hypoxic resp failure- pt intbated on 4/25 covid pneumonia/ARDS/ possible MRSA pneumonia- send resp culture   MRSA bacteremia -on vancomycin- because of b/l lung infiltrates /b/l pneumonia MRSA superinfection was a concern and he was started on PO linezolid but it had to be stopped because of drop in platelet count after 3 doses only!! Restarted Unasyn as fever after stopping -there is concern for aspiration  No further fever  Thrombocytopenia Unlikely due to linezolid as platelet started to decline from 4/16- and on 4/17 it was 128 3 am and that is the day he was started on linezolid and  got 3 doses only before it was stopped. No HIT antibodies ? Etiology   Slightly increasing cr- may have to discontinue vanco if crcl worsens   COVID pneumonia with acute hypoxic resp failure- management as per pulmonologist .    Cocaine use- A risk factor for worsening COVID pneumonitis  Chronic urinary retention-has foley Pt seriously ill Discussed the management with his nurse

## 2019-05-18 NOTE — Progress Notes (Signed)
Patient remains unresponsive to voice but responds to pain and discomfort.  He clinches his mouth when performing mouth suction and care.  He opens his eyes to pain and moves his arms about.  He is wearing mittens on his hands.  His breath sounds are diminished and he has significant mucus removed from his lungs during ETT suction.  He is currently tx with ventilator therapy 50% FiO2 Peep 5 RR 26 7.51french 25cm@lips .  He also has an O/G tube 65cm@ lips.  His Vital high protein is infusing at 36ml/hr @ goal.  His urine is amber in color.  He still has generalized edema throughout his extremities.  Fentanyl @ 182mcg, propofol was discontinued and Precedex @ 81mcg with LEVO reduced from 63mcg-2mcg since this morning at 0700.  Patient family was called and updated on status.

## 2019-05-18 NOTE — Progress Notes (Signed)
[facility] for Electrolyte Monitoring and Replacement   Recent Labs: Potassium (mmol/L)  Date Value  05/18/2019 3.9   Magnesium (mg/dL)  Date Value  05/18/2019 2.0   Calcium (mg/dL)  Date Value  05/18/2019 7.6 (L)   Albumin (g/dL)  Date Value  05/17/2019 1.1 (L)   Phosphorus (mg/dL)  Date Value  05/18/2019 5.0 (H)   Sodium (mmol/L)  Date Value  05/18/2019 142   Corrected Calcium: 8.34  Assessment: 64 year old male admitted after recent diagnosis of COVID and worsening condition. Patient was admitted to ICU and started on pressors.   Goal of Therapy:  Potassium ~ 4 and magnesium ~ 2.  Plan:  No replacement warranted. Will obtain labs in am.   Currie Paris, RPh  05/18/2019 4:36 PM

## 2019-05-19 DIAGNOSIS — J9601 Acute respiratory failure with hypoxia: Secondary | ICD-10-CM

## 2019-05-19 DIAGNOSIS — Z515 Encounter for palliative care: Secondary | ICD-10-CM

## 2019-05-19 DIAGNOSIS — Z7189 Other specified counseling: Secondary | ICD-10-CM

## 2019-05-19 LAB — BLOOD GAS, ARTERIAL
Acid-base deficit: 3.4 mmol/L — ABNORMAL HIGH (ref 0.0–2.0)
Bicarbonate: 23.6 mmol/L (ref 20.0–28.0)
FIO2: 0.75
MECHVT: 500 mL
O2 Saturation: 84.2 %
PEEP: 5 cmH2O
Patient temperature: 37
RATE: 26 resp/min
pCO2 arterial: 49 mmHg — ABNORMAL HIGH (ref 32.0–48.0)
pH, Arterial: 7.29 — ABNORMAL LOW (ref 7.350–7.450)
pO2, Arterial: 55 mmHg — ABNORMAL LOW (ref 83.0–108.0)

## 2019-05-19 LAB — CBC WITH DIFFERENTIAL/PLATELET
Abs Immature Granulocytes: 0.03 10*3/uL (ref 0.00–0.07)
Basophils Absolute: 0 10*3/uL (ref 0.0–0.1)
Basophils Relative: 1 %
Eosinophils Absolute: 0.2 10*3/uL (ref 0.0–0.5)
Eosinophils Relative: 4 %
HCT: 22.7 % — ABNORMAL LOW (ref 39.0–52.0)
Hemoglobin: 7.5 g/dL — ABNORMAL LOW (ref 13.0–17.0)
Immature Granulocytes: 1 %
Lymphocytes Relative: 5 %
Lymphs Abs: 0.3 10*3/uL — ABNORMAL LOW (ref 0.7–4.0)
MCH: 30.1 pg (ref 26.0–34.0)
MCHC: 33 g/dL (ref 30.0–36.0)
MCV: 91.2 fL (ref 80.0–100.0)
Monocytes Absolute: 0 10*3/uL — ABNORMAL LOW (ref 0.1–1.0)
Monocytes Relative: 1 %
Neutro Abs: 4.6 10*3/uL (ref 1.7–7.7)
Neutrophils Relative %: 88 %
Platelets: 36 10*3/uL — ABNORMAL LOW (ref 150–400)
RBC: 2.49 MIL/uL — ABNORMAL LOW (ref 4.22–5.81)
RDW: 14.7 % (ref 11.5–15.5)
Smear Review: DECREASED
WBC: 5.1 10*3/uL (ref 4.0–10.5)
nRBC: 0 % (ref 0.0–0.2)

## 2019-05-19 LAB — BASIC METABOLIC PANEL
Anion gap: 5 (ref 5–15)
BUN: 55 mg/dL — ABNORMAL HIGH (ref 8–23)
CO2: 24 mmol/L (ref 22–32)
Calcium: 7.2 mg/dL — ABNORMAL LOW (ref 8.9–10.3)
Chloride: 116 mmol/L — ABNORMAL HIGH (ref 98–111)
Creatinine, Ser: 1.31 mg/dL — ABNORMAL HIGH (ref 0.61–1.24)
GFR calc Af Amer: >60 mL/min (ref >60)
GFR calc non Af Amer: 57 mL/min — ABNORMAL LOW (ref >60)
Glucose, Bld: 60 mg/dL — ABNORMAL LOW (ref 70–99)
Potassium: 3.6 mmol/L (ref 3.5–5.1)
Sodium: 145 mmol/L (ref 135–145)

## 2019-05-19 LAB — GLUCOSE, CAPILLARY
Glucose-Capillary: 111 mg/dL — ABNORMAL HIGH (ref 70–99)
Glucose-Capillary: 49 mg/dL — ABNORMAL LOW (ref 70–99)
Glucose-Capillary: 121 mg/dL — ABNORMAL HIGH (ref 70–99)
Glucose-Capillary: 93 mg/dL (ref 70–99)
Glucose-Capillary: 94 mg/dL (ref 70–99)

## 2019-05-19 LAB — FIBRIN DERIVATIVES D-DIMER (ARMC ONLY): Fibrin derivatives D-dimer (ARMC): >10000 ng/mL (FEU) — ABNORMAL HIGH (ref 0.00–499.00)

## 2019-05-19 LAB — PROTIME-INR
INR: 1.4 — ABNORMAL HIGH (ref 0.8–1.2)
Prothrombin Time: 16.6 seconds — ABNORMAL HIGH (ref 11.4–15.2)

## 2019-05-19 LAB — MAGNESIUM: Magnesium: 2 mg/dL (ref 1.7–2.4)

## 2019-05-19 LAB — PHOSPHORUS: Phosphorus: 4.3 mg/dL (ref 2.5–4.6)

## 2019-05-19 MED ORDER — ACETAMINOPHEN 650 MG RE SUPP: 650.0000 mg | Freq: Four times a day (QID) | RECTAL | Status: DC | PRN

## 2019-05-19 MED ORDER — GLYCOPYRROLATE 0.2 MG/ML IJ SOLN
0.4000 mg | INTRAMUSCULAR | Status: DC
  Administered 2019-05-19: 0.4 mg via INTRAVENOUS
  Filled 2019-05-19: qty 2

## 2019-05-19 MED ORDER — HYDROCORTISONE NA SUCCINATE PF 100 MG IJ SOLR
50.0000 mg | Freq: Four times a day (QID) | INTRAMUSCULAR | Status: DC
  Administered 2019-05-19: 50 mg via INTRAVENOUS
  Filled 2019-05-19: qty 2

## 2019-05-19 MED ORDER — DEXTROSE 5 % IV SOLN: INTRAVENOUS | Status: DC

## 2019-05-19 MED ORDER — DIPHENHYDRAMINE HCL 50 MG/ML IJ SOLN: 25.0000 mg | INTRAMUSCULAR | Status: DC | PRN

## 2019-05-19 MED ORDER — GLYCOPYRROLATE 0.2 MG/ML IJ SOLN
0.2000 mg | INTRAMUSCULAR | Status: DC | PRN
  Filled 2019-05-19: qty 1

## 2019-05-19 MED ORDER — FAMOTIDINE 20 MG PO TABS
20.0000 mg | ORAL_TABLET | Freq: Two times a day (BID) | ORAL | Status: DC
  Administered 2019-05-19: 20 mg
  Filled 2019-05-19: qty 1

## 2019-05-19 MED ORDER — MORPHINE BOLUS VIA INFUSION
5.0000 mg | INTRAVENOUS | Status: DC | PRN
  Filled 2019-05-19: qty 5

## 2019-05-19 MED ORDER — GLYCOPYRROLATE 1 MG PO TABS
1.0000 mg | ORAL_TABLET | ORAL | Status: DC | PRN
  Filled 2019-05-19: qty 1

## 2019-05-19 MED ORDER — PRO-STAT SUGAR FREE PO LIQD: 30.0000 mL | Freq: Every day | ORAL | Status: DC

## 2019-05-19 MED ORDER — FUROSEMIDE 10 MG/ML IJ SOLN
80.0000 mg | Freq: Once | INTRAMUSCULAR | Status: AC
  Administered 2019-05-19: 80 mg via INTRAVENOUS
  Filled 2019-05-19: qty 8

## 2019-05-19 MED ORDER — DEXTROSE IN LACTATED RINGERS 5 % IV SOLN: INTRAVENOUS | Status: DC

## 2019-05-19 MED ORDER — ONDANSETRON HCL 4 MG PO TABS: 4.0000 mg | ORAL_TABLET | Freq: Four times a day (QID) | ORAL | Status: DC | PRN

## 2019-05-19 MED ORDER — HYDROCOD POLST-CPM POLST ER 10-8 MG/5ML PO SUER: 5.0000 mL | Freq: Two times a day (BID) | ORAL | Status: DC | PRN

## 2019-05-19 MED ORDER — VITAL AF 1.2 CAL PO LIQD: 1000.0000 mL | ORAL | Status: DC

## 2019-05-19 MED ORDER — ONDANSETRON HCL 4 MG/2ML IJ SOLN: 4.0000 mg | Freq: Four times a day (QID) | INTRAMUSCULAR | Status: DC | PRN

## 2019-05-19 MED ORDER — MIDAZOLAM HCL 2 MG/2ML IJ SOLN: 2.0000 mg | INTRAMUSCULAR | Status: DC | PRN

## 2019-05-19 MED ORDER — ACETAMINOPHEN 325 MG PO TABS: 650.0000 mg | ORAL_TABLET | Freq: Four times a day (QID) | ORAL | Status: DC | PRN

## 2019-05-19 MED ORDER — POTASSIUM CHLORIDE 20 MEQ PO PACK
40.0000 meq | PACK | Freq: Once | ORAL | Status: AC
  Administered 2019-05-19: 40 meq
  Filled 2019-05-19: qty 2

## 2019-05-19 MED ORDER — POLYVINYL ALCOHOL 1.4 % OP SOLN
1.0000 [drp] | Freq: Four times a day (QID) | OPHTHALMIC | Status: DC | PRN
  Filled 2019-05-19: qty 15

## 2019-05-19 MED ORDER — GUAIFENESIN-DM 100-10 MG/5ML PO SYRP: 10.0000 mL | ORAL_SOLUTION | ORAL | Status: DC | PRN

## 2019-05-19 MED ORDER — ALBUMIN HUMAN 25 % IV SOLN
12.5000 g | Freq: Once | INTRAVENOUS | Status: AC
  Administered 2019-05-19: 12.5 g via INTRAVENOUS
  Filled 2019-05-19: qty 50

## 2019-05-19 MED ORDER — GLYCOPYRROLATE 0.2 MG/ML IJ SOLN: 0.2000 mg | INTRAMUSCULAR | Status: DC | PRN

## 2019-05-19 MED ORDER — MORPHINE SULFATE (PF) 2 MG/ML IV SOLN: 2.0000 mg | INTRAVENOUS | Status: DC | PRN

## 2019-05-19 MED ORDER — MIDODRINE HCL 5 MG PO TABS
20.0000 mg | ORAL_TABLET | Freq: Three times a day (TID) | ORAL | Status: DC
  Administered 2019-05-19 (×2): 20 mg
  Filled 2019-05-19 (×2): qty 4

## 2019-05-19 MED ORDER — DEXAMETHASONE 4 MG PO TABS
6.0000 mg | ORAL_TABLET | Freq: Every day | ORAL | Status: DC
  Administered 2019-05-19: 6 mg
  Filled 2019-05-19: qty 1.5

## 2019-05-19 MED ORDER — LORAZEPAM 2 MG/ML IJ SOLN
2.0000 mg | INTRAMUSCULAR | Status: DC | PRN
  Administered 2019-05-19: 2 mg via INTRAVENOUS
  Filled 2019-05-19: qty 1

## 2019-05-19 MED ORDER — MORPHINE 100MG IN NS 100ML (1MG/ML) PREMIX INFUSION
4.0000 mg/h | INTRAVENOUS | Status: DC
  Administered 2019-05-19: 4 mg/h via INTRAVENOUS
  Filled 2019-05-19: qty 100

## 2019-05-19 MED ORDER — FLUDROCORTISONE ACETATE 0.1 MG PO TABS
0.2000 mg | ORAL_TABLET | Freq: Two times a day (BID) | ORAL | Status: DC
  Administered 2019-05-19: 0.2 mg
  Filled 2019-05-19 (×2): qty 2

## 2019-05-19 MED ORDER — DEXTROSE 50 % IV SOLN
1.0000 | Freq: Once | INTRAVENOUS | Status: AC
  Administered 2019-05-19: 50 mL via INTRAVENOUS

## 2019-05-21 LAB — CULTURE, RESPIRATORY W GRAM STAIN

## 2019-05-22 NOTE — Death Summary Note (Signed)
DEATH SUMMARY   Patient Details  Name: Victor Arnold. MRN: [id number] DOB: 11/23/1955  Admission/Discharge Information   Admit Date:  05-10-2019  Date of Death:   May 23, 2019   Time of Death:  1712-04-17  Length of Stay: Apr 07, 2022  Referring Physician: Cletis Athens, MD   Reason(s) for Hospitalization  Septic shock and COVID 19 pneumonia  Diagnoses  Preliminary cause of death: COVID 38 pneumonia, cocaine abuse, MRSA infection, pneumonia, DM Secondary Diagnoses (including complications and co-morbidities):  Active Problems:   COVID-19   Pressure injury of skin   Acute respiratory failure with hypoxia [facility])   Palliative care by specialist   Brief Hospital Course (including significant findings, care, treatment, and services provided and events leading to death)  64 yo male with recent CT Abd Pelvis results on 04/10 concerning for high suspicion of low-grade lymphoproliferative disorder oncology recommended once COVID-19 pneumonia resolves pt will need PET scan and biopsy in the outpatient setting. He was recently hospitalized and diagnosed with COVID-19 on 04/10 but left AMA on 04/14. He is currently admitted with septic shock and acute hypoxic respiratory failure secondary to COVID-19 pneumonia and acute renal failure with metabolic/lactic acidosis requiring levophed gtt   SIGNIFICANT EVENTS/STUDIES:  05/10/22: Pt admitted to ICU with COVID-19 pneumonia +COCAINE 04/16:MRSA bacteremia, encephalopathy 04/17: Mentation clearing, no increase in oxygen requirement 4/17 ECHOLeft ventricular ejection fraction, by estimation, is 45%. Left ventricular diastolic parameters  are consistent with Grade I diastolic  dysfunction (impaired relaxation).  04/18: Worsening thrombocytopenia, work-up for HIT, switched to argatroban, discontinued linezolid 4/23- patient states he is feeling better, wanted me to help re-arrange bed for comfort during exam otherwise no complaints today 4/24-patient is  similar to yesterday , we had V/q scan done today with no PE found. Discussed care plan with ID , am holidng off on Actemra due to MRSA bacteremia. 4/25-patient is awake, sitting up in bed, speaking in no distress. Will wean HFNC and optimize for downgrade to medical floor today.  4/26 back to ICU for progressive resp failure patient with severe resp failure, ETT and MV support Intubated, critically ill 4/27 remains on vent, critically ill, DNR status 05-23-22 severe ARDS, severe hypoxia   CULTURES:  COVID-19 04/10>>positive  MRSA PCR 04/15>> positive Blood x2 04/15>>positive, MRSA Urine 04/15>> positive, MRSA  PLAN OF CARE Severe ACUTE Hypoxic and Hypercapnic Respiratory Failure due to COVID 19 pneumonia now with progressive fibrotic phase with underlying COCAINE ABUSE AND MRSA BACTEREMIA  Severe COVID-19 infection, ARDS and pneumonia/pneumonitis  ACUTE KIDNEY INJURY/Renal Failure   ACUTE SYSTOLIC CARDIAC FAILURE- EF 45%   The Clinical status was relayed to family in detail.  Updated and notified of patients medical condition.  Patient with Progressive multiorgan failure with very low chance of meaningful recovery despite all aggressive and optimal medical therapy.  Patient is in the dying  Process associated with suffering.  Family understands the situation.  They have consented and agreed to DNR/DNI and would like to proceed with Comfort care measures.  Family are satisfied with Plan of action and management. All questions answered     Pertinent Labs and Studies  Significant Diagnostic Studies DG Abd 1 View  Result Date: 05/16/2019 CLINICAL DATA:  Orogastric tube placement. EXAM: ABDOMEN - 1 VIEW COMPARISON:  May 03, 2019 FINDINGS: Marked severity infiltrates are seen within the visualized portions of the bilateral lung bases. A nasogastric tube is seen with its distal tip overlying the expected region of the gastric antrum. The bowel gas pattern  is normal.  A large amount of stool is seen throughout the colon. No radio-opaque calculi or other significant radiographic abnormality are seen. IMPRESSION: 1. Nasogastric tube positioning, as described above. 2. Marked severity bibasilar infiltrates. 3. Large stool burden without evidence of bowel obstruction. Electronically Signed   By: Virgina Norfolk M.D.   On: 05/16/2019 21:43   MR ABDOMEN W WO CONTRAST  Result Date: 05/03/2019 CLINICAL DATA:  Evaluate hepatic mass seen on ultrasound. EXAM: MRI ABDOMEN WITHOUT AND WITH CONTRAST TECHNIQUE: Multiplanar multisequence MR imaging of the abdomen was performed both before and after the administration of intravenous contrast. CONTRAST:  16mL GADAVIST GADOBUTROL 1 MMOL/ML IV SOLN COMPARISON:  Ultrasound evaluation of 05/03/2019 FINDINGS: Lower chest: Basilar interstitial and airspace disease better visualized on previous CT evaluation. No large pleural effusion. No pericardial fluid. With limited assessment of the lung bases. Subcarinal adenopathy also seen on the diffusion weighted sequence best seen on image 69 of series 6 measuring approximately 15 mm. Small left retrocrural lymph node approximately 1 cm. Hepatobiliary: 9 mm T2 bright lesion in the right lobe. This is within 1-2 mm of the abnormality seen in terms of the sagittal plane on the ultrasound evaluation. No additional abnormality is demonstrated. This corresponds with a benign hemangioma seen on CT scan. No pericholecystic stranding or biliary ductal dilation. No fat or iron in the liver. Pancreas: Pancreas with mild ductal dilation similar to previous CT evaluations. No focal pancreatic lesion. Spleen:  Normal size no focal suspicious lesion. Adrenals/Urinary Tract: Adrenal glands are normal. No suspicious renal lesion. Cysts in the bilateral kidneys largest on the left. Stomach/Bowel: Stomach, visualized small and large bowel are unremarkable. Study not protocol for bowel evaluation. Extensive gas in the colon  limits assessment. Vascular/Lymphatic:  Vascular structures in the abdomen are patent. Bulky retroperitoneal adenopathy worse along the left periaortic chain, largest measuring 2.2 cm short axis (image 64 of contrasted series. Slightly smaller lymph nodes are seen anterior to this. A retrocaval lymph node is also noted at 14 mm. (68, series 13) diffusion-weighted imaging also displays these lymph nodes to best. Subtle presumed inflammatory changes are noted about the left diaphragmatic crus tracking about celiac and SMA. Edema seen about the under surface of the left hemidiaphragm not seen on the right. This is seen only on the diffusion-weighted imaging without corresponding abnormality seen other sequences. Mild stranding previous CT is noted this area though there is a paucity of abdominal. Other:  No ascites. The Musculoskeletal: No inflammation about the spine. No destructive bone process. IMPRESSION: 1. Bulky retroperitoneal adenopathy worse along the left periaortic chain, suspicious for lymphoma or metastatic disease. Lymphoma is favored. Consider PET evaluation for further assessment as there may be sites that would be more amenable to biopsy. This would also encompass the chest which may be helpful. 2. Edema about the left diaphragmatic crus with thickening the left hemidiaphragm. Findings are of uncertain significance potentially related to lymphatic congestion or localized inflammation. Finding seen in hind site changed previous exams. Perhaps this relates to prior rib fractures in this location as well. These changes are more pronounced than on the initial evaluation of November 2020 where they were not seen, seen as a subtle abnormality on most recent comparison 3. Basilar interstitial and airspace disease better visualized on previous CT evaluation. 4. 9 mm benign hemangioma in the right lobe of the liver. Electronically Signed   By: Zetta Bills M.D.   On: 05/03/2019 13:05   NM Pulmonary  Perfusion  Result Date: 05/15/2019 CLINICAL DATA:  64 year old male with COVID pneumonia and acute hypoxemic respiratory failure concerning for PE. EXAM: NUCLEAR MEDICINE PERFUSION LUNG SCAN TECHNIQUE: Perfusion images were obtained in multiple projections after intravenous injection of radiopharmaceutical. Ventilation scans intentionally deferred if perfusion scan and chest x-ray adequate for interpretation during COVID 19 epidemic. RADIOPHARMACEUTICALS:  4.567 mCi Tc-56m MAA IV COMPARISON:  Chest x-ray 05/15/2019 FINDINGS: No peripheral wedge-shaped perfusion defects in either lung. Mildly mottled perfusion uptake slightly worse on the right than the left consistent with known underlying multifocal pneumonia. IMPRESSION: Low probability for acute pulmonary embolus. Electronically Signed   By: Jacqulynn Cadet M.D.   On: 05/15/2019 13:54   CT Abdomen Pelvis W Contrast  Result Date: 05/01/2019 CLINICAL DATA:  Abdominal pain with nausea and vomiting for 2 days. History of diabetes. EXAM: CT ABDOMEN AND PELVIS WITH CONTRAST TECHNIQUE: Multidetector CT imaging of the abdomen and pelvis was performed using the standard protocol following bolus administration of intravenous contrast. CONTRAST:  142mL OMNIPAQUE IOHEXOL 300 MG/ML  SOLN COMPARISON:  Chest radiographs and abdominopelvic CT 12/04/2018. FINDINGS: Lower chest: There are extensive patchy ground-glass opacities at both lung bases which are new and suspicious for viral pneumonia. No consolidation, pleural or pericardial effusion. Hepatobiliary: The liver is normal in density without suspicious focal abnormality. No evidence of gallstones, gallbladder wall thickening or biliary dilatation. Pancreas: Mildly atrophied. There is mildly increased dilatation of the main pancreatic duct, but no evidence of pancreatic mass or surrounding inflammatory change. Spleen: Normal in size without focal abnormality. Adrenals/Urinary Tract: Both adrenal glands appear normal.  Stable bilateral renal cysts. There are probable small nonobstructing bilateral renal calculi. The urinary bladder is markedly distended, and there is mild bilateral pelvicaliectasis. No evidence of ureteral calculus or focal perinephric fluid collection. Stomach/Bowel: No evidence of bowel wall thickening, distention or surrounding inflammatory change. The appendix is not clearly visualized. Vascular/Lymphatic: Multiple enlarged retroperitoneal lymph nodes are again noted, similar to the previous study. Representative nodes include a 2.1 cm short axis left periaortic node on image 37/2 and a 1.8 cm right periaortic node on image 39/2. Probable enlarged mesenteric lymph nodes, suboptimally evaluated due to the lack of enteric contrast and paucity of intra-abdominal fat. Mild aortic and branch vessel atherosclerosis. The portal, superior mesenteric and splenic veins are patent. Reproductive: Mild enlargement of the prostate gland with central calcifications. Other: Paucity of intra-abdominal fat. No ascites, focal extraluminal fluid collection or free air. Musculoskeletal: No acute or significant osseous findings. Lower lumbar spondylosis and asymmetric right hip arthropathy. IMPRESSION: 1. Extensive patchy ground-glass opacities at both lung bases, new and suspicious for viral pneumonia. Consider chest radiographs and COVID-19 testing. 2. No significant change in multiple enlarged retroperitoneal and mesenteric lymph nodes. Based on the size of these nodes, a lymphoproliferative process remains a distinct possibility, although they could be reactive given their stability over 5 months. 3. Markedly distended urinary bladder with mild bilateral pelvicaliectasis suggesting chronic bladder outlet obstruction. No evidence of ureteral calculus or focal perinephric fluid collection. 4. Aortic Atherosclerosis (ICD10-I70.0). Electronically Signed   By: Richardean Sale M.D.   On: 05/01/2019 17:45   US Venous Img Upper Uni  Right(DVT)  Result Date: 05/07/2019 CLINICAL DATA:  Right upper extremity edema, pain EXAM: RIGHT UPPER EXTREMITY VENOUS DOPPLER ULTRASOUND TECHNIQUE: Gray-scale sonography with graded compression, as well as color Doppler and duplex ultrasound were performed to evaluate the upper extremity deep venous system from the level of the subclavian vein and including the jugular, axillary,  basilic, radial, ulnar and upper cephalic vein. Spectral Doppler was utilized to evaluate flow at rest and with distal augmentation maneuvers. COMPARISON:  None. FINDINGS: Contralateral Subclavian Vein: Respiratory phasicity is normal and symmetric with the symptomatic side. No evidence of thrombus. Normal compressibility. Internal Jugular Vein: No evidence of thrombus. Normal compressibility, respiratory phasicity and response to augmentation. Subclavian Vein: No evidence of thrombus. Normal compressibility, respiratory phasicity and response to augmentation. Axillary Vein: No evidence of thrombus. Normal compressibility, respiratory phasicity and response to augmentation. Cephalic Vein: No evidence of thrombus. Normal compressibility, respiratory phasicity and response to augmentation. Basilic Vein: No evidence of thrombus. Normal compressibility, respiratory phasicity and response to augmentation. Brachial Veins: No evidence of thrombus. Normal compressibility, respiratory phasicity and response to augmentation. Some portions of the brachial vein not visualized because PICC line dressing but no gross thrombus appreciated in the visualized segments. Radial Veins: No evidence of thrombus. Normal compressibility, respiratory phasicity and response to augmentation. Ulnar Veins: No evidence of thrombus. Normal compressibility, respiratory phasicity and response to augmentation. IMPRESSION: No evidence of significant DVT within the right upper extremity. Electronically Signed   By: Jerilynn Mages.  Shick M.D.   On: 05/07/2019 10:18   DG Chest Port 1  View  Result Date: 05/16/2019 CLINICAL DATA:  Central line placement. EXAM: PORTABLE CHEST 1 VIEW COMPARISON:  May 16, 2019 FINDINGS: Since the prior study there is been interval placement of a left internal jugular venous catheter. Its distal tip is seen overlying the midportion of the superior vena cava. There is stable endotracheal tube and nasogastric tube positioning. Marked severity patchy bilateral infiltrates are seen the. Given differences in technique this is predominant stable in severity when compared to the prior study. Small bilateral pleural effusions are noted. No pneumothorax is identified. The heart size and mediastinal contours are within normal limits. Degenerative changes seen within the mid and lower thoracic spine. IMPRESSION: 1. Interval placement of left internal jugular venous catheter, as described above, without evidence of pneumothorax. 2. Marked severity bilateral infiltrates, stable in severity when compared to the prior study. Electronically Signed   By: Virgina Norfolk M.D.   On: 05/16/2019 21:44   DG Chest Port 1 View  Result Date: 05/16/2019 CLINICAL DATA:  Respiratory failure. EXAM: PORTABLE CHEST 1 VIEW COMPARISON:  05/15/2019 FINDINGS: An endotracheal tube with tip 5.5 cm above the carina is now noted. An NG tube is identified with tip overlying the distal stomach. Diffuse bilateral airspace opacities are slightly increased. No pneumothorax or large pleural effusion identified. IMPRESSION: Endotracheal tube and NG tube placement with slight increase in diffuse bilateral airspace opacities. Electronically Signed   By: Margarette Canada M.D.   On: 05/16/2019 20:38   DG Chest Port 1 View  Result Date: 05/15/2019 CLINICAL DATA:  Recent diagnosis of COVID-19.  Worsening. EXAM: PORTABLE CHEST 1 VIEW COMPARISON:  May 14, 2019 FINDINGS: Bilateral diffuse pulmonary infiltrates have worsened in the interval. Small pleural effusions. Stable cardiomediastinal silhouette. No  pneumothorax. IMPRESSION: Worsening bilateral pulmonary infiltrates consistent with the patient's known pneumonia Electronically Signed   By: Dorise Bullion III M.D   On: 05/15/2019 11:13   DG Chest Port 1 View  Result Date: 05/14/2019 CLINICAL DATA:  Acute respiratory failure with hypoxia. COVID positive 05/01/2019 EXAM: PORTABLE CHEST 1 VIEW COMPARISON:  Radiograph yesterday, additional priors. FINDINGS: Overlying monitoring devices partially obscure evaluation. Heterogeneous bilateral lung opacities are not significantly changed. Unchanged heart size and mediastinal contours. Possible small pleural effusions. No pneumothorax. IMPRESSION: Bilateral lung opacities  consistent with COVID pneumonia, extensive and unchanged from yesterday. Electronically Signed   By: Keith Rake M.D.   On: 05/14/2019 03:09   DG Chest Port 1 View  Result Date: 05/13/2019 CLINICAL DATA:  COVID pneumonia. Acute respiratory failure. EXAM: PORTABLE CHEST 1 VIEW COMPARISON:  One-view chest x-ray 09/05/2019 FINDINGS: Heart size is normal. Progressive diffuse interstitial and airspace disease is evident bilaterally. Small effusions are noted bilaterally. Axial skeleton is unremarkable. IMPRESSION: 1. Progressive diffuse interstitial and airspace disease bilaterally compatible with pneumonia. Superimposed edema is not excluded. 2. Small bilateral pleural effusions. Electronically Signed   By: San Morelle M.D.   On: 05/13/2019 07:34   DG Chest Port 1 View  Result Date: 04/27/2019 CLINICAL DATA:  Dyspnea EXAM: PORTABLE CHEST 1 VIEW COMPARISON:  12/04/2018 FINDINGS: 2 frontal views of the chest demonstrate a stable cardiac silhouette. Patchy bibasilar airspace disease is noted. No effusion or pneumothorax. No acute bony abnormalities. IMPRESSION: 1. Patchy bibasilar airspace disease consistent with multifocal pneumonia or edema. Pattern of airspace disease can be seen with COVID-19. Electronically Signed   By: Randa Ngo M.D.   On: 05/14/2019 03:28   DG Abd Portable 2V  Result Date: 05/03/2019 CLINICAL DATA:  Abdominal pain EXAM: PORTABLE ABDOMEN - 2 VIEW COMPARISON:  May 02, 2019 FINDINGS: There is moderate stool in the colon. There is no bowel dilatation or air-fluid level to suggest bowel obstruction. No free air. There is atelectatic change in the left lung base. IMPRESSION: No bowel obstruction or free air evident.  Moderate stool in colon. Electronically Signed   By: Lowella Grip III M.D.   On: 05/03/2019 09:03   DG Abd Portable 2V  Result Date: 05/02/2019 CLINICAL DATA:  65 year old male with abdominal pain. Positive COVID-19. EXAM: PORTABLE ABDOMEN - 2 VIEW COMPARISON:  CT abdomen pelvis dated 05/01/2019. FINDINGS: There is no bowel dilatation or evidence of obstruction. No free air. Punctate radiopaque foci over the left renal silhouette may correspond to the stone seen on the prior CT. There is degenerative changes of the spine and hips. No acute osseous pathology. Bilateral confluent airspace opacities in keeping with known COVID-19. IMPRESSION: 1. No evidence of bowel obstruction. 2. Bilateral confluent airspace opacities in keeping with known COVID-19. Electronically Signed   By: Anner Crete M.D.   On: 05/02/2019 17:46   ECHOCARDIOGRAM COMPLETE  Result Date: 05/04/2019    ECHOCARDIOGRAM REPORT   Patient Name:   Victor Arnold. Date of Exam: 05/04/2019 Medical Rec #:  [id number]          Height:       69.0 in Accession #:    [id number]         Weight:       165.0 lb Date of Birth:  10-19-1955          BSA:          1.904 m Patient Age:    27 years           BP:           105/65 mmHg Patient Gender: M                  HR:           101 bpm. Exam Location:  [facility] Procedure: 2D Echo, Cardiac Doppler, Color Doppler and Strain Analysis Indications:     Dyspnea 786.09  History:         Patient has no prior history of Echocardiogram  examinations.                  Risk Factors:Hypertension and  Diabetes.  Sonographer:     Sherrie Sport RDCS (AE) Referring Phys:  [place] Diagnosing Phys: Harrell Gave End MD IMPRESSIONS  1. Left ventricular ejection fraction, by estimation, is 50 to 55%. The left ventricle has low normal function. The left ventricle has no regional wall motion abnormalities. There is mild left ventricular hypertrophy. Left ventricular diastolic parameters are consistent with Grade I diastolic dysfunction (impaired relaxation).  2. Right ventricular systolic function is normal. The right ventricular size is mildly enlarged. There is normal pulmonary artery systolic pressure.  3. The mitral valve is degenerative. Mild mitral valve regurgitation. No evidence of mitral stenosis.  4. The aortic valve is tricuspid. Aortic valve regurgitation is not visualized. Mild aortic valve sclerosis is present, with no evidence of aortic valve stenosis. FINDINGS  Left Ventricle: Left ventricular ejection fraction, by estimation, is 50 to 55%. The left ventricle has low normal function. The left ventricle has no regional wall motion abnormalities. The left ventricular internal cavity size was normal in size. There is mild left ventricular hypertrophy. Left ventricular diastolic parameters are consistent with Grade I diastolic dysfunction (impaired relaxation). Right Ventricle: The right ventricular size is mildly enlarged. No increase in right ventricular wall thickness. Right ventricular systolic function is normal. There is normal pulmonary artery systolic pressure. Left Atrium: Left atrial size was normal in size. Right Atrium: Right atrial size was normal in size. Pericardium: The pericardium was not well visualized. Mitral Valve: The mitral valve is degenerative in appearance. There is mild thickening of the mitral valve leaflet(s). Mild mitral valve regurgitation. No evidence of mitral valve stenosis. Tricuspid Valve: The tricuspid valve is not well visualized. Tricuspid valve regurgitation is  mild. Aortic Valve: The aortic valve is tricuspid. . There is moderate thickening and mild calcification of the aortic valve. Aortic valve regurgitation is not visualized. Mild aortic valve sclerosis is present, with no evidence of aortic valve stenosis. There  is moderate thickening of the aortic valve. There is mild calcification of the aortic valve. Aortic valve mean gradient measures 3.0 mmHg. Aortic valve peak gradient measures 4.5 mmHg. Aortic valve area, by VTI measures 3.01 cm. Pulmonic Valve: The pulmonic valve was not well visualized. Pulmonic valve regurgitation is not visualized. No evidence of pulmonic stenosis. Aorta: The aortic root is normal in size and structure. Pulmonary Artery: The pulmonary artery is not well seen. Venous: The inferior vena cava was not well visualized. IAS/Shunts: The interatrial septum was not well visualized.  LEFT VENTRICLE PLAX 2D LVIDd:         3.52 cm  Diastology LVIDs:         2.56 cm  LV e' lateral:   5.11 cm/s LV PW:         1.17 cm  LV E/e' lateral: 9.1 LV IVS:        1.06 cm  LV e' medial:    5.00 cm/s LVOT diam:     2.10 cm  LV E/e' medial:  9.3 LV SV:         50 LV SV Index:   26       2D Longitudinal Strain LVOT Area:     3.46 cm 2D Strain GLS Avg:     -10.0 %  3D Volume EF:                         3D EF:        54 %                         LV EDV:       155 ml                         LV ESV:       70 ml                         LV SV:        84 ml RIGHT VENTRICLE RV Basal diam:  4.30 cm LEFT ATRIUM         Index      RIGHT ATRIUM           Index LA diam:    3.10 cm 1.63 cm/m RA Area:     15.74 cm 8.27 cm/m  AORTIC VALVE                   PULMONIC VALVE AV Area (Vmax):    2.84 cm    RVOT Peak grad: 2 mmHg AV Area (Vmean):   2.62 cm AV Area (VTI):     3.01 cm AV Vmax:           106.00 cm/s AV Vmean:          80.850 cm/s AV VTI:            0.166 m AV Peak Grad:      4.5 mmHg AV Mean Grad:      3.0 mmHg LVOT Vmax:         86.80 cm/s  LVOT Vmean:        61.200 cm/s LVOT VTI:          0.144 m LVOT/AV VTI ratio: 0.87  AORTA Ao Root diam: 3.10 cm MITRAL VALVE               TRICUSPID VALVE MV Area (PHT): 4.77 cm    TR Peak grad:   18.3 mmHg MV Decel Time: 159 msec    TR Vmax:        214.00 cm/s MV E velocity: 46.70 cm/s MV A velocity: 68.10 cm/s  SHUNTS MV E/A ratio:  0.69        Systemic VTI:  0.14 m                            Systemic Diam: 2.10 cm Nelva Bush MD Electronically signed by Nelva Bush MD Signature Date/Time: 05/04/2019/2:15:12 PM    Final    ECHOCARDIOGRAM LIMITED  Result Date: 05/08/2019    ECHOCARDIOGRAM LIMITED REPORT   Patient Name:   Victor Arnold. Date of Exam: 05/08/2019 Medical Rec #:  [id number]          Height:       71.0 in Accession #:    [id number]         Weight:       164.5 lb Date of Birth:  07-20-1955          BSA:          1.940 m Patient Age:    53 years  BP:           100/63 mmHg Patient Gender: M                  HR:           98 bpm. Exam Location:  [facility] Procedure: Limited Echo Indications:     SBE  History:         Patient has prior history of Echocardiogram examinations. Risk                  Factors:Hypertension, Dyslipidemia and Covid +.  Sonographer:     Raliegh Ip Thornton-Maynard Referring Phys:  [id number] CARMEN Veda Canning Diagnosing Phys: Ida Rogue MD IMPRESSIONS  1. Left ventricular ejection fraction, by estimation, is 45%. The left ventricle has mildly decreased function. The left ventricle has no regional wall motion abnormalities. Left ventricular diastolic parameters are consistent with Grade I diastolic dysfunction (impaired relaxation).  2. Right ventricular systolic function is normal. The right ventricular size is normal. There is normal pulmonary artery systolic pressure.  3. No valve vegetation noted. FINDINGS  Left Ventricle: Left ventricular ejection fraction, by estimation, is 40 to 45%. The left ventricle has mildly decreased function. The left ventricle has no regional wall  motion abnormalities. The left ventricular internal cavity size was normal in size. There is no left ventricular hypertrophy. Right Ventricle: The right ventricular size is normal. No increase in right ventricular wall thickness. Right ventricular systolic function is normal. There is normal pulmonary artery systolic pressure. The tricuspid regurgitant velocity is 2.32 m/s, and  with an assumed right atrial pressure of 5 mmHg, the estimated right ventricular systolic pressure is [id number] mmHg. Left Atrium: Left atrial size was normal in size. Right Atrium: Right atrial size was normal in size. Pericardium: There is no evidence of pericardial effusion. Mitral Valve: The mitral valve is normal in structure. Normal mobility of the mitral valve leaflets. Mild mitral valve regurgitation. No evidence of mitral valve stenosis. Tricuspid Valve: The tricuspid valve is normal in structure. Tricuspid valve regurgitation is mild . No evidence of tricuspid stenosis. Aortic Valve: The aortic valve was not well visualized. Aortic valve regurgitation is not visualized. Mild aortic valve sclerosis is present, with no evidence of aortic valve stenosis. Aortic valve peak gradient measures 4.8 mmHg. Pulmonic Valve: The pulmonic valve was normal in structure. Pulmonic valve regurgitation is not visualized. No evidence of pulmonic stenosis. Aorta: The aortic root is normal in size and structure. Venous: The inferior vena cava is normal in size with greater than 50% respiratory variability, suggesting right atrial pressure of 3 mmHg. IAS/Shunts: No atrial level shunt detected by color flow Doppler.  LEFT VENTRICLE PLAX 2D LVOT diam:     2.30 cm  Diastology LV SV:         45       LV e' lateral:   4.57 cm/s LV SV Index:   23       LV E/e' lateral: 11.6 LVOT Area:     4.15 cm LV e' medial:    6.42 cm/s                         LV E/e' medial:  8.2  AORTIC VALVE AV Area (Vmax): 2.18 cm AV Vmax:        110.00 cm/s AV Peak Grad:   4.8 mmHg LVOT  Vmax:      57.60 cm/s LVOT Vmean:     45.800  cm/s LVOT VTI:       0.109 m MV E velocity: 52.90 cm/s  TRICUSPID VALVE MV A velocity: 59.20 cm/s  TR Peak grad:   21.5 mmHg MV E/A ratio:  0.89        TR Vmax:        232.00 cm/s                             SHUNTS                            Systemic VTI:  0.11 m                            Systemic Diam: 2.30 cm Ida Rogue MD Electronically signed by Ida Rogue MD Signature Date/Time: 05/08/2019/3:20:39 PM    Final    [place] EKG SITE RITE  Result Date: 05/11/2019 If Site Rite image not attached, placement could not be confirmed due to current cardiac rhythm.  US Abdomen Limited RUQ  Result Date: 05/03/2019 CLINICAL DATA:  Pain. EXAM: ULTRASOUND ABDOMEN LIMITED RIGHT UPPER QUADRANT COMPARISON:  05/03/2019 abdominal radiographs. 05/01/2019 CT abdomen pelvis. FINDINGS: Gallbladder: No gallstones or wall thickening visualized. No sonographic Murphy sign noted by sonographer. Common bile duct: Diameter: 3.4 mm.  Within normal limits. Liver: 18.3 x 11.6 x 12.7 mm hyperechoic right hepatic mass which demonstrates internal vascularity on Doppler imaging. Diffusely increased hepatic parenchymal echogenicity. Portal vein is patent on color Doppler imaging with normal direction of blood flow towards the liver. Other: No ascites. IMPRESSION: 18.3 cm hyperechoic right hepatic mass. Differential includes a benign hemangioma. Please note that MRI is more sensitive in its evaluation of focal hepatic lesions. Hepatic steatosis. Electronically Signed   By: Primitivo Gauze M.D.   On: 05/03/2019 09:33    Microbiology Recent Results (from the past 240 hour(s))  CULTURE, BLOOD (ROUTINE X 2) w Reflex to ID Panel     Status: None   Collection Time: 05/10/19  5:00 PM   Specimen: BLOOD  Result Value Ref Range Status   Specimen Description BLOOD [facility]  Final   Special Requests   Final    BOTTLES DRAWN AEROBIC AND ANAEROBIC Blood Culture adequate volume   Culture   Final     NO GROWTH 5 DAYS Performed at [facility], [address] [place], [place] [address]    Report Status 05/15/2019 FINAL  Final  CULTURE, BLOOD (ROUTINE X 2) w Reflex to ID Panel     Status: None   Collection Time: 05/10/19  5:00 PM   Specimen: BLOOD  Result Value Ref Range Status   Specimen Description BLOOD [facility]  Final   Special Requests   Final    BOTTLES DRAWN AEROBIC AND ANAEROBIC Blood Culture adequate volume   Culture   Final    NO GROWTH 5 DAYS Performed at [facility], [address], [place], [place] [address]    Report Status 05/15/2019 FINAL  Final  Culture, respiratory (non-expectorated)     Status: None (Preliminary result)   Collection Time: 05/18/19  9:01 PM   Specimen: Tracheal Aspirate; Respiratory  Result Value Ref Range Status   Specimen Description   Final    TRACHEAL ASPIRATE Performed at [facility], [address], [place], [place] [address]    Special Requests   Final  NONE Performed at [facility], [place], [place] [address]    Gram Stain   Final    FEW WBC PRESENT, PREDOMINANTLY PMN ABUNDANT GRAM POSITIVE COCCI IN CLUSTERS Performed at [facility], [place] [address], [place], [place] [address]    Culture PENDING  Incomplete   Report Status PENDING  Incomplete    Lab Basic Metabolic Panel: Recent Labs  Lab 05/15/19 0509 05/16/19 0355 05/17/19 0512 05/18/19 0429 2019-05-27 0355  NA 142 143 141 142 145  K 4.3 3.7 4.0 3.9 3.6  CL 115* 113* 112* 112* 116*  CO2 22 23 25 23 24   GLUCOSE 124* 297* 90 159* 60*  BUN 26* 29* 37* 48* 55*  CREATININE 0.66 0.79 1.11 1.29* 1.31*  CALCIUM 7.4* 7.6* 7.8* 7.6* 7.2*  MG 1.8 2.0 1.9 2.0 2.0  PHOS 3.7 4.0 4.9* 5.0* 4.3   Liver Function Tests: Recent Labs  Lab 05/13/19 0626 05/14/19 0606 05/15/19 0509 05/16/19 0355 05/17/19 0512  AST 50* 36 46* 30 26  ALT 28 24 24 25 23   ALKPHOS 348* 263* 375* 375* 293*  BILITOT 1.4* 1.5* 1.3* 1.6*  1.2  PROT 5.6* 5.6* 5.5* 6.0* 6.0*  ALBUMIN 1.4* 1.3* 1.2* 1.3* 1.1*   No results for input(s): LIPASE, AMYLASE in the last 168 hours. No results for input(s): AMMONIA in the last 168 hours. CBC: Recent Labs  Lab 05/15/19 0509 05/16/19 0355 05/17/19 0512 05/18/19 0429 05/27/19 0355  WBC 3.5* 3.4* 4.1 5.2 5.1  NEUTROABS 3.1 3.1 3.7 4.6 4.6  HGB 7.5* 8.1* 7.7* 7.7* 7.5*  HCT 20.7* 23.9* 23.3* 22.7* 22.7*  MCV 83.8 88.2 91.7 89.4 91.2  PLT 102* 89* 59* 43* 36*   Cardiac Enzymes: No results for input(s): CKTOTAL, CKMB, CKMBINDEX, TROPONINI in the last 168 hours. Sepsis Labs: Recent Labs  Lab 05/13/19 0626 05/14/19 0606 05/15/19 0509 05/15/19 0509 05/16/19 0355 05/17/19 0512 05/17/19 1246 05/18/19 0429 May 27, 2019 0355  PROCALCITON 1.11  --  3.27  --   --   --  6.80  --   --   WBC 3.8*   < > 3.5*   < > 3.4* 4.1  --  5.2 5.1   < > = values in this interval not displayed.      Flora Lipps 05/27/19, 3:35 PM

## 2019-05-22 NOTE — Progress Notes (Addendum)
Palliative:  I was present at time of extubation to comfort measures as discussed with sister, Vinnie Level. I called her prior to extubation to ensure she had no further questions or concerns. Time of death 53 and I notified Vinnie Level that he has died and had a peaceful transition. She would like to use [facility] and best contact is her number listed on facesheet. Condolences given.   Vinie Sill, NP Palliative Medicine Team Pager [phone] (Please see [email or web address] for schedule) Team Phone [phone]    Greater than 50%  of this time was spent counseling and coordinating care related to the above assessment and plan

## 2019-05-22 NOTE — Consult Note (Addendum)
Consultation Note Date: 06/13/19   Patient Name: Victor Arnold.  DOB: Jul 28, 1955  MRN: [id number]  Age / Sex: 64 y.o., male  PCP: Cletis Athens, MD Referring Physician: Flora Lipps, MD  Reason for Consultation: Establishing goals of care   HPI/Patient Profile: 64 y.o. male  with past medical history of suspicion of low-grade lymphoproliferative disorder on recent CT abd/pelvis (further work up recommended after COVID resolution), recent hospitalization with COVID 4/10 but left AMA 4/14, diabetes, HTN, GERD admitted on 05/10/2019 with septic shock and acute respiratory failure secondary to COVID pneumonia requiring ventilator support. Unfortunately he has had prolonged hospitalization but now with worsening health status and poor prognosis.   Clinical Assessment and Goals of Care: I met today at Mr. Legrand' bedside after receiving reports from Dr. Mortimer Fries as well as RN. Dr. Mortimer Fries has spoken with sister, Vinnie Level, and they have decided on DNR.   Mr. Andrepont is mostly unresponsive but does respond to painful stimuli at times. He is on fentanyl infusion. Appears comfortable. Requiring vaspressor support and 100% oxygen on vent. Bilateral feet are cold to touch. Breathing appears almost agonal at times.   I called and spoke with sister, Vinnie Level, and told her about my concern for her brother and assessment. She shares with me that she has spoken with Dr. Mortimer Fries and she has spoken with the other siblings and they are prepared to move towards taking him off the ventilator and keep him comfortable. She requests that this is done at 5 pm today. She also clarifies that Mr. Isadore has no spouse and no children and next of kin are siblings of which Vinnie Level is main contact. Other siblings are Dutch Quint, Melford Aase. They have 5 other brothers that are deceased. At this time family do not wish for visitation as he is not  alert to be aware of their presence and support given the risk of COVID as well. Vinnie Level also shares that he did not tell them when he was hospitalized previously as he did not wish for them to see him like this so she would like to remember him at his best.   All questions/concerns addressed. Emotional support provided. Updated Mustafa RN and Dr. Mortimer Fries.   Primary Decision Maker NEXT OF KIN siblings    SUMMARY OF RECOMMENDATIONS   - One way extubation to comfort today at [place]:  DNR   Symptom Management:   Full comfort care today 5pm.   Palliative Prophylaxis:   Frequent Pain Assessment, Oral Care and Turn Reposition  Additional Recommendations (Limitations, Scope, Preferences):  Full Comfort Care  Psycho-social/Spiritual:   Desire for further Chaplaincy support:no  Additional Recommendations: Grief/Bereavement Support  Prognosis:   Hours - Days  Discharge Planning: Anticipated Hospital Death      Primary Diagnoses: Present on Admission: . COVID-19   I have reviewed the medical record, interviewed the patient and family, and examined the patient. The following aspects are pertinent.  Past Medical History:  Diagnosis Date  .  Diabetes mellitus without complication ([place])   . GERD (gastroesophageal reflux disease)   . Hypertension    Social History   Socioeconomic History  . Marital status: Single    Spouse name: Not on file  . Number of children: Not on file  . Years of education: Not on file  . Highest education level: Not on file  Occupational History  . Not on file  Tobacco Use  . Smoking status: Never Smoker  . Smokeless tobacco: Never Used  Substance and Sexual Activity  . Alcohol use: Not Currently  . Drug use: Not Currently  . Sexual activity: Not on file  Other Topics Concern  . Not on file  Social History Narrative  . Not on file   Social Determinants of Health   Financial Resource Strain:   .  Difficulty of Paying Living Expenses:   Food Insecurity:   . Worried About Charity fundraiser in the Last Year:   . Arboriculturist in the Last Year:   Transportation Needs:   . Film/video editor (Medical):   Marland Kitchen Lack of Transportation (Non-Medical):   Physical Activity:   . Days of Exercise per Week:   . Minutes of Exercise per Session:   Stress:   . Feeling of Stress :   Social Connections:   . Frequency of Communication with Friends and Family:   . Frequency of Social Gatherings with Friends and Family:   . Attends Religious Services:   . Active Member of Clubs or Organizations:   . Attends Archivist Meetings:   Marland Kitchen Marital Status:    History reviewed. No pertinent family history. Scheduled Meds: . albuterol  2 puff Inhalation Q6H  . chlorhexidine gluconate (MEDLINE KIT)  15 mL Mouth Rinse BID  . Chlorhexidine Gluconate Cloth  6 each Topical Q0600  . dexamethasone  6 mg Per Tube Daily  . famotidine  20 mg Per Tube BID  . feeding supplement (PRO-STAT SUGAR FREE 64)  30 mL Per Tube Daily  . fludrocortisone  0.2 mg Per Tube BID  . hydrocortisone sod succinate (SOLU-CORTEF) inj  50 mg Intravenous Q6H  . mouth rinse  15 mL Mouth Rinse 10 times per day  . midodrine  20 mg Per Tube TID WC  . multivitamin with minerals  1 tablet Per Tube Daily   Continuous Infusions: . sodium chloride Stopped (05/15/2019 0641)  . sodium chloride Stopped (05/14/19 0947)  . sodium chloride 10 mL/hr at 05/18/19 0600  . ampicillin-sulbactam (UNASYN) IV Stopped (Jun 01, 2019 0829)  . dexmedetomidine (PRECEDEX) IV infusion 1 mcg/kg/hr (06-01-2019 1006)  . feeding supplement (VITAL AF 1.2 CAL)    . fentaNYL infusion INTRAVENOUS 150 mcg/hr (Jun 01, 2019 1000)  . norepinephrine (LEVOPHED) Adult infusion 4 mcg/min (06/01/19 1000)  . norepinephrine (LEVOPHED) Adult infusion Stopped (05/16/19 2307)  . vancomycin Stopped (05/18/19 1843)   PRN Meds:.acetaminophen, chlorpheniramine-HYDROcodone,  guaiFENesin-dextromethorphan, ipratropium-albuterol, lip balm, morphine injection, ondansetron **OR** ondansetron (ZOFRAN) IV No Active Allergies Review of Systems  Unable to perform ROS: Acuity of condition    Physical Exam Vitals and nursing note reviewed.  Constitutional:      Appearance: He is ill-appearing.     Interventions: He is intubated.  Cardiovascular:     Rate and Rhythm: Normal rate.  Pulmonary:     Effort: No tachypnea, accessory muscle usage or respiratory distress. He is intubated.     Comments: 100% Fi02 Abdominal:     Palpations: Abdomen is soft.  Comments: tight  Neurological:     Mental Status: He is unresponsive.     Comments: on fentanyl     Vital Signs: BP (!) 108/54 (BP Location: Right Arm)   Pulse 79   Temp 99.7 F (37.6 C) (Oral)   Resp 17   Ht '5\' 11"'  (1.803 m)   Wt 85.2 kg   SpO2 100%   BMI 26.20 kg/m  Pain Scale: CPOT POSS *See Group Information*: 1-Acceptable,Awake and alert Pain Score: 0-No pain   SpO2: SpO2: 100 % O2 Device:SpO2: 100 % O2 Flow Rate: .O2 Flow Rate (L/min): 55 L/min  IO: Intake/output summary:   Intake/Output Summary (Last 24 hours) at 2019-06-15 1227 Last data filed at 06/15/19 1038 Gross per 24 hour  Intake 2482.88 ml  Output 1775 ml  Net 707.88 ml    LBM: Last BM Date: 05/18/19 Baseline Weight: Weight: 79.4 kg Most recent weight: Weight: 85.2 kg     Palliative Assessment/Data:     Time In: 1245 Time Out: 1335 Time Total: 50 min Greater than 50%  of this time was spent counseling and coordinating care related to the above assessment and plan.  Signed by: Vinie Sill, NP Palliative Medicine Team Pager # [phone] (M-F 8a-5p) Team Phone # [phone] (Nights/Weekends)

## 2019-05-22 NOTE — Progress Notes (Signed)
Per MD order patient extubated to room air.

## 2019-05-22 NOTE — Progress Notes (Addendum)
CRITICAL CARE NOTE BRIEF PATIENT DESCRIPTION:  64 yo male with recent CT Abd Pelvis results on 04/10 concerning for high suspicion of low-grade lymphoproliferative disorder oncology recommended once COVID-19 pneumonia resolves pt will need PET scan and biopsy in the outpatient setting.  He was recently hospitalized and diagnosed with COVID-19 on 04/10 but left AMA on 04/14.  He is currently admitted with septic shock and acute hypoxic respiratory failure secondary to COVID-19 pneumonia and acute renal failure with metabolic/lactic acidosis requiring levophed gtt   SIGNIFICANT EVENTS/STUDIES:  04/15: Pt admitted to ICU with COVID-19 pneumonia  +COCAINE  04/16: MRSA bacteremia, encephalopathy 04/17: Mentation clearing, no increase in oxygen requirement 4/17 ECHO Left ventricular ejection fraction, by estimation, is 45%. Left ventricular diastolic parameters  are consistent with Grade I diastolic  dysfunction (impaired relaxation).  04/18: Worsening thrombocytopenia, work-up for HIT, switched to argatroban, discontinued linezolid 4/23- patient states he is feeling better, wanted me to help re-arrange bed for comfort during exam otherwise no complaints today 4/24- patient is similar to yesterday , we had V/q scan done today with no PE found. Discussed care plan with ID , am holidng off on Actemra due to MRSA bacteremia. 4/25- patient is awake, sitting up in bed, speaking in no distress. Will wean HFNC and optimize for downgrade to medical floor today.   4/26 patient with severe resp failure, ETT and MV support Intubated, critically ill 4/27 remains on vent, critically ill, DNR status 4/28 severe ARDS, severe hypoxia   CULTURES:  COVID-19 04/10>>positive  MRSA PCR 04/15>> positive Blood x2 04/15>> positive,  MRSA Urine 04/15>> positive, MRSA    CC  Follow up severe resp faiilure  HPI Severe hypoxia fio2 at 80% Remains critically ill Prognosis is poor   Vent Mode: PRVC FiO2 (%):   [50 %-75 %] 75 % Set Rate:  [26 bmp] 26 bmp Vt Set:  [500 mL] 500 mL PEEP:  [5 cmH20] 5 cmH20 Plateau Pressure:  [20 cmH20] 20 cmH20   BP 113/61 (BP Location: Right Arm)   Pulse 87   Temp (!) 97.5 F (36.4 C) (Oral)   Resp (!) 30   Ht 5\' 11"  (1.803 m)   Wt 85.2 kg   SpO2 90%   BMI 26.20 kg/m    I/O last 3 completed shifts: In: 3779.7 [P.O.:70; I.V.:2104.5; NG/GT:605; IV Piggyback:1000.3] Out: 2675 [Urine:2675] Total I/O In: 93.1 [I.V.:90.8; IV Piggyback:2.4] Out: -   SpO2: 90 % O2 Flow Rate (L/min): 55 L/min FiO2 (%): 75 %  Estimated body mass index is 26.2 kg/m as calculated from the following:   Height as of this encounter: 5\' 11"  (1.803 m).   Weight as of this encounter: 85.2 kg.  BMP Latest Ref Rng & Units Jun 02, 2019 05/18/2019 05/17/2019  Glucose 70 - 99 mg/dL 60(L) 159(H) 90  BUN 8 - 23 mg/dL 55(H) 48(H) 37(H)  Creatinine 0.61 - 1.24 mg/dL 1.31(H) 1.29(H) 1.11  Sodium 135 - 145 mmol/L 145 142 141  Potassium 3.5 - 5.1 mmol/L 3.6 3.9 4.0  Chloride 98 - 111 mmol/L 116(H) 112(H) 112(H)  CO2 22 - 32 mmol/L 24 23 25   Calcium 8.9 - 10.3 mg/dL 7.2(L) 7.6(L) 7.8(L)   CBC    Component Value Date/Time   WBC 5.1 2019-06-02 0355   RBC 2.49 (L) 06/02/2019 0355   HGB 7.5 (L) Jun 02, 2019 0355   HCT 22.7 (L) Jun 02, 2019 0355   PLT 36 (L) 06-02-2019 0355   MCV 91.2 06/02/19 0355   MCH 30.1 06/02/19 0355  MCHC 33.0 05/20/2019 0355   RDW 14.7 May 20, 2019 0355   LYMPHSABS 0.3 (L) May 20, 2019 0355   MONOABS 0.0 (L) 05/20/19 0355   EOSABS 0.2 May 20, 2019 0355   BASOSABS 0.0 2019-05-20 0355    REVIEW OF SYSTEMS  PATIENT IS UNABLE TO PROVIDE COMPLETE REVIEW OF SYSTEM S DUE TO SEVERE CRITICAL ILLNESS AND ENCEPHALOPATHY  PHYSICAL EXAMINATION:  GENERAL:critically ill appearing, +resp distress HEAD: Normocephalic, atraumatic.  EYES: Pupils equal, round, reactive to light.  No scleral icterus.  MOUTH: Moist mucosal membrane. NECK: Supple. No thyromegaly. No nodules. No  JVD.  PULMONARY: +rhonchi, +wheezing CARDIOVASCULAR: S1 and S2. Regular rate and rhythm. No murmurs, rubs, or gallops.  GASTROINTESTINAL: Soft, nontender, -distended. Positive bowel sounds.  MUSCULOSKELETAL: No swelling, clubbing, or edema.  NEUROLOGIC: obtunded SKIN:intact,warm,dry   Pressure Injury 05/12/19 Buttocks Left Stage 2 -  Partial thickness loss of dermis presenting as a shallow open injury with a red, pink wound bed without slough. (Active)  05/12/19 0800  Location: Buttocks  Location Orientation: Left  Staging: Stage 2 -  Partial thickness loss of dermis presenting as a shallow open injury with a red, pink wound bed without slough.  Wound Description (Comments):   Present on Admission:      MEDICATIONS: I have reviewed all medications and confirmed regimen as documented   CULTURE RESULTS   Recent Results (from the past 240 hour(s))  CULTURE, BLOOD (ROUTINE X 2) w Reflex to ID Panel     Status: None   Collection Time: 05/10/19  5:00 PM   Specimen: BLOOD  Result Value Ref Range Status   Specimen Description BLOOD [facility]  Final   Special Requests   Final    BOTTLES DRAWN AEROBIC AND ANAEROBIC Blood Culture adequate volume   Culture   Final    NO GROWTH 5 DAYS Performed at [facility], [address] [place], [place] [address]    Report Status 05/15/2019 FINAL  Final  CULTURE, BLOOD (ROUTINE X 2) w Reflex to ID Panel     Status: None   Collection Time: 05/10/19  5:00 PM   Specimen: BLOOD  Result Value Ref Range Status   Specimen Description BLOOD [facility]  Final   Special Requests   Final    BOTTLES DRAWN AEROBIC AND ANAEROBIC Blood Culture adequate volume   Culture   Final    NO GROWTH 5 DAYS Performed at [facility], [address] [place], [place] [address]    Report Status 05/15/2019 FINAL  Final           Indwelling Urinary Catheter continued, requirement due to   Reason to continue Indwelling Urinary Catheter strict Intake/Output  monitoring for hemodynamic instability   Central Line/ continued, requirement due to  Reason to continue [place] of central venous pressure or other hemodynamic parameters and poor IV access   Ventilator continued, requirement due to severe respiratory failure   Ventilator Sedation RASS 0 to -2       ASSESSMENT AND PLAN SYNOPSIS  Severe ACUTE Hypoxic and Hypercapnic Respiratory Failure due to COVID 19 pneumonia now with progressive fibrotic phase with underlying COCAINE ABUSE AND MRSA BACTEREMIA -continue Mechanical Ventilator support -continue Bronchodilator Therapy -Wean Fio2 and PEEP as tolerated -VAP/VENT bundle implementation  Severe COVID-19 infection, ARDS and pneumonia/pneumonitis IV steroids  proning as tolerated due to severe hypoxia   Maintain airborne and contact precautions  As needed bronchodilators (MDI) Vitamin C and zinc Antitussives High risk for death  ACUTE KIDNEY INJURY/Renal  Failure -follow chem 7 -follow UO -continue Foley Catheter-assess need -Avoid nephrotoxic agents   ACUTE SYSTOLIC CARDIAC FAILURE- EF 45% -oxygen as needed -Lasix as tolerated    NEUROLOGY - intubated and sedated - minimal sedation to achieve a RASS goal: -1   INFECTIOUS DISEASE -continue antibiotics as prescribed -follow up cultures -follow up ID consultation    CARDIAC ICU monitoring    GI GI PROPHYLAXIS as indicated  NUTRITIONAL STATUS DIET-->TF's as tolerated Constipation protocol as indicated   ENDO - will use ICU hypoglycemic\Hyperglycemia protocol if indicated  ELECTROLYTES -follow labs as needed -replace as needed -pharmacy consultation and following     DVT/GI PRX ordered TRANSFUSIONS AS NEEDED MONITOR FSBS ASSESS the need for LABS as needed   Severe protein malnutrition   Polysubstance abuse Positive UDS for cocaine and opiates  OVERALL POOR PROGNOSIS    Critical Care Time devoted to patient care  services described in this note is 45 minutes.   Overall, patient is critically ill, prognosis is guarded.  Patient with Multiorgan failure and at high risk for cardiac arrest and death.    Corrin Parker, M.D.  [facility]  Medical Director [place] Director [facility] Cardio-Pulmonary Department

## 2019-05-22 NOTE — Progress Notes (Signed)
The Clinical status was relayed to family in detail.  Updated and notified of patients medical condition.  Patient with Progressive multiorgan failure with very low chance of meaningful recovery despite all aggressive and optimal medical therapy.  Patient is in the dying  Process associated with suffering.  Family understands the situation.  They have consented and agreed to DNR status   Family are satisfied with Plan of action and management. All questions answered  Additional CC time 32 mins  Recommend Palliative care consultation  Marjon Doxtater Patricia Pesa, M.D.  [facility]  Medical Director [place] Director [facility] Cardio-Pulmonary Department

## 2019-05-22 NOTE — Progress Notes (Signed)
Nutrition Follow-up  RD working remotely.  DOCUMENTATION CODES:   Not applicable  INTERVENTION:  Initiate new goal TF regimen of Vital AF 1.2 at 55 mL/hr (1320 mL goal daily volume) + Pro-Stat 30 mL once daily per tube. Provides 1684 kcal, 114 grams of protein, 1069 mL H2O daily.  Provide minimum free water flush of 30 mL Q4hrs to maintain tube patency.  NUTRITION DIAGNOSIS:   Inadequate oral intake related to inability to eat as evidenced by NPO status.  Ongoing - addressing with TF regimen.  GOAL:   Patient will meet greater than or equal to 90% of their needs  Met with TF regimen.  MONITOR:   Vent status, Labs, Weight trends, TF tolerance, Skin, I & O's  REASON FOR ASSESSMENT:   Ventilator    ASSESSMENT:   64 year old male with PMHx of HTN, GERD, DM, recent CT abdomen/pelvis results on 4/10 concerning for high suspicion of low-grade lymphoproliferative disorder, recent hospitalized and diagnosed with COVID-19 on 4/10 but left AMA on 4/14, who was re-admitted on 4/15 for septic shock, acute hypoxic respiratory failure secondary to COVID-19 PNA, AKI. Patient was intubated on 4/25.  Patient remains intubated and sedated. He has now been transitioned off propofol gtt. Patient tolerating Vital High Protein at 55 mL/hr.  Patient is currently intubated on ventilator support MV: 13.4 L/min Temp (24hrs), Avg:97.9 F (36.6 C), Min:97.5 F (36.4 C), Max:98.6 F (37 C)  Propofol: N/A  Medications reviewed and include: Decadron 6 mg daily per tube, famotidine, Solu-Cortef 50 mg Q6hrs IV, MVI daily, human albumin 12.5 grams IV once today, Unasyn, Precedex gtt, fentanyl gtt, norepinephrine gtt at 4 mcg/min, vancomycin..  Labs reviewed: CBG 49-121, Chloride 116, BUN 55, Creatinine 1.31.  Enteral Access: OGT  I/O: 1975 mL UOP yesterday (1 mL/kg/hr)  Weight trend: 85.2 kg on 4/28; +10.6 kg from 4/15  Diet Order:   Diet Order            Diet NPO time specified  Diet  effective now             EDUCATION NEEDS:   No education needs have been identified at this time  Skin:  Skin Assessment: Skin Integrity Issues:(stg 2 buttocks)  Last BM:  05/18/2019 - type 6  Height:   Ht Readings from Last 1 Encounters:  04/27/2019 '5\' 11"'  (1.803 m)   Weight:   Wt Readings from Last 1 Encounters:  Jun 08, 2019 85.2 kg   Ideal Body Weight:  78.2 kg  BMI:  Body mass index is 26.2 kg/m.  Estimated Nutritional Needs:   Kcal:  1626  Protein:  105-120 grams  Fluid:  >/=2 L/day  Jacklynn Barnacle, MS, RD, LDN Pager number available on Amion

## 2019-05-22 NOTE — Progress Notes (Signed)
The Clinical status was relayed to family in detail.  Updated and notified of patients medical condition.  Patient with Progressive multiorgan failure with very low chance of meaningful recovery despite all aggressive and optimal medical therapy.  Patient is in the dying  Process associated with suffering.  Family understands the situation.  They have consented and agreed to DNR/DNI and would like to proceed with Comfort care measures.  Family are satisfied with Plan of action and management. All questions answered    Corrin Parker, M.D.  [facility]  Medical Director [place] Director [facility] Cardio-Pulmonary Department

## 2019-05-22 NOTE — Progress Notes (Signed)
Victor Arnold was born on this earth 1955/07/03 and lived his life and loved whom he loved.  He passed away on 2019/05/25 at [facility], the age of 78 years.

## 2019-05-22 NOTE — Progress Notes (Signed)
CDS called with [facility]. Patient can be fully released to funeral home. Referral # [id number] Charge RN Lovena Le made aware.

## 2019-05-22 DEATH — deceased

## 2022-02-09 IMAGING — MR MR ABDOMEN WO/W CM
17 of 18 series · 44 of 48 positions shown · IV contrast (7ml Gadavist)
Comparison: Ultrasound evaluation of 05/03/2019

CLINICAL DATA: Evaluate hepatic mass seen on ultrasound.

EXAM:
MRI ABDOMEN WITHOUT AND WITH CONTRAST
TECHNIQUE: Multiplanar multisequence MR imaging of the abdomen was performed
both before and after the administration of intravenous contrast.
CONTRAST:  7mL GADAVIST GADOBUTROL 1 MMOL/ML IV SOLN

[Series 2: T2 · coronal · 6.0mm · 1.19mm/px · 2 of 30 slices shown (1 of 2)]
[im 1/30]
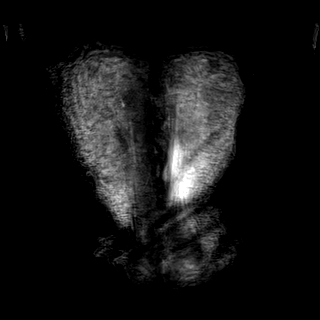
[im 30/30]
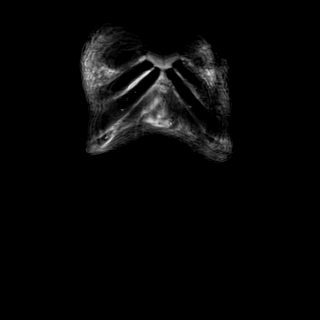

[Series 3: T2 · axial · 6.0mm · 1.19mm/px · z∈[-108,+115]mm · 2 of 32 slices shown (2 of 2)]
[im 1/32]
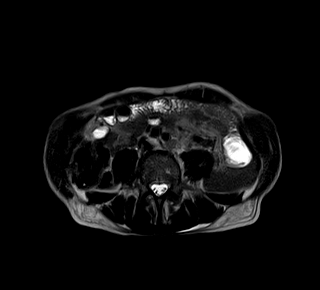
[im 32/32]
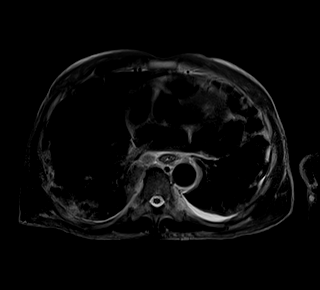

[Series 5: T2 fat-sat · axial · 6.0mm · 1.19mm/px · z∈[-112,+125]mm · 2 of 34 slices shown]
[im 1/34]
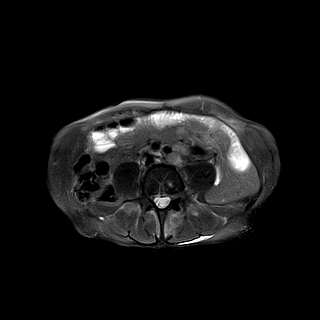
[im 34/34]
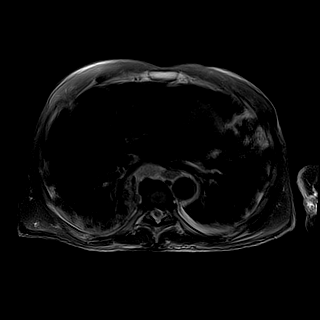

[Series 6: ax dwi_tracew · axial · 6.0mm · 1.42mm/px · z∈[-112,+125]mm · 5 of 102 slices shown]
[im 1/102]
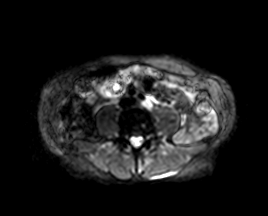
[im 26/102]
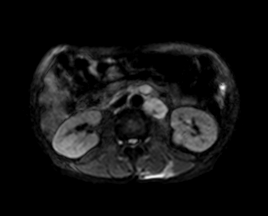
[im 51/102]
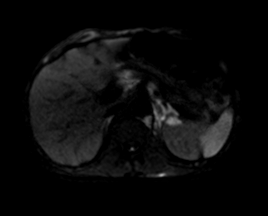
[im 76/102]
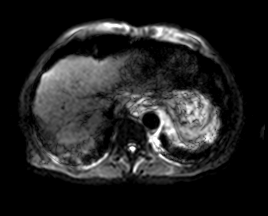
[im 102/102]
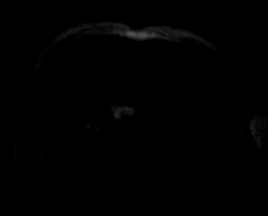

[Series 7: ax dwi_adc · axial · 6.0mm · 1.42mm/px · z∈[-112,+125]mm · 2 of 34 slices shown]
[im 1/34]
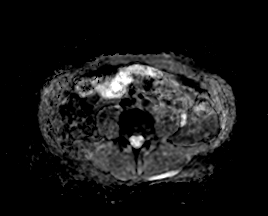
[im 34/34]
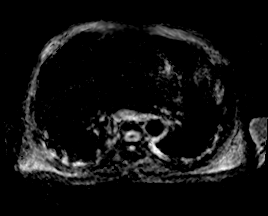

[Series 8: T1 · axial · 6.0mm · 0.74mm/px · z∈[-108,+115]mm · 2 of 32 slices shown (1 of 2)]
[im 1/32]
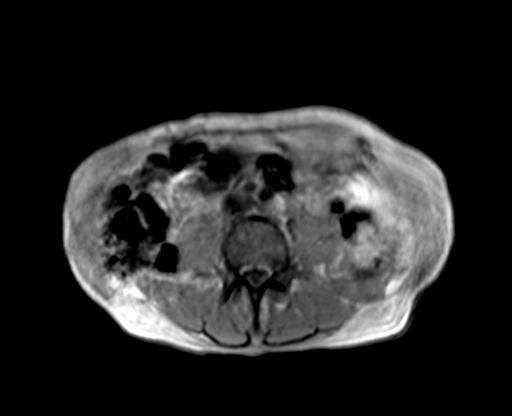
[im 32/32]
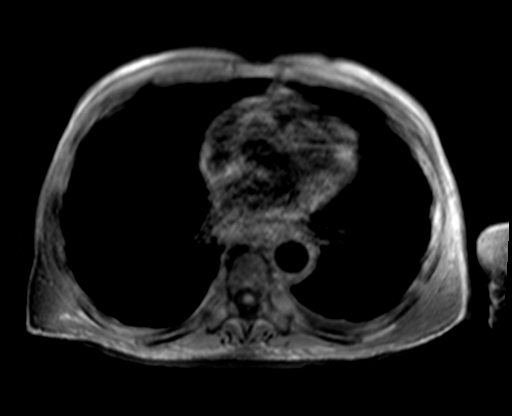

[Series 8: T1 · axial · 6.0mm · 0.74mm/px · z∈[-108,+115]mm · 2 of 32 slices shown (2 of 2)]
[im 1/32]
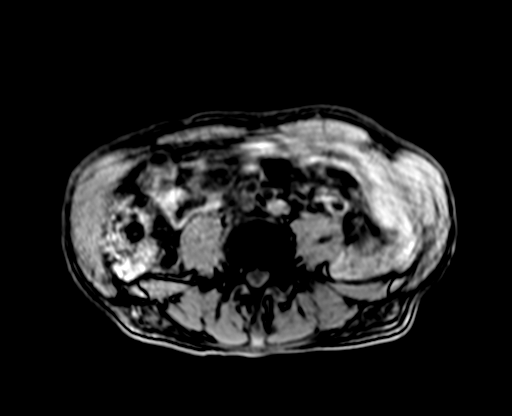
[im 32/32]
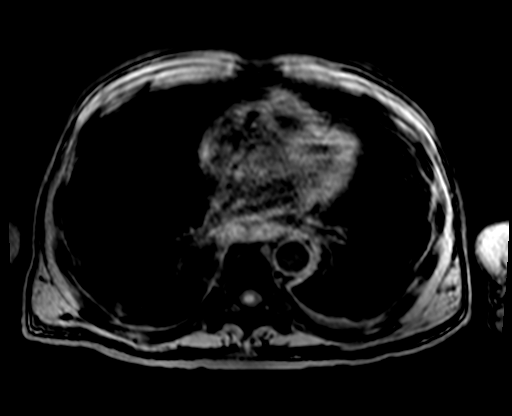

[Series 9: bSSFP · axial · 6.0mm · 0.74mm/px · 1 of 32 slices shown]
[im 1/32]
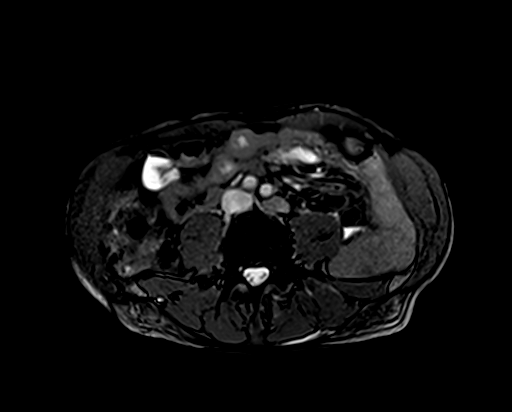

[Series 10: T1 dynamic fat-sat · axial · non-contrast · 3.0mm · 1.19mm/px · z∈[-120,+117]mm · 3 of 80 slices shown (1 of 4)]
[im 1/80]
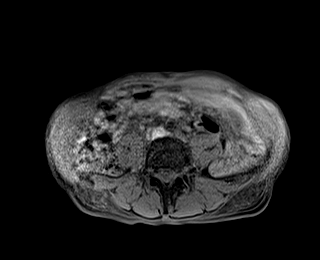
[im 40/80]
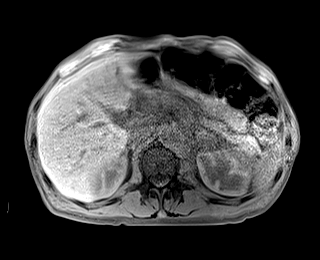
[im 80/80]
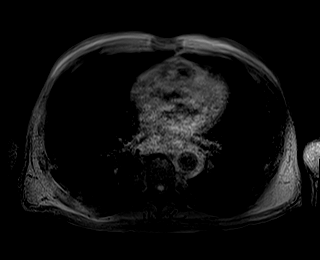

[Series 11: T1 dynamic fat-sat post-contrast · axial · 3.0mm · 1.19mm/px · z∈[-120,+117]mm · 3 of 80 slices shown (1 of 4)]
[im 1/80]
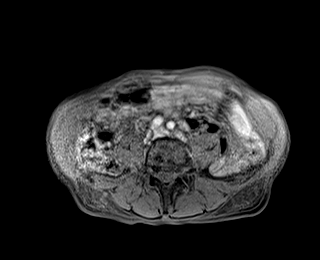
[im 40/80]
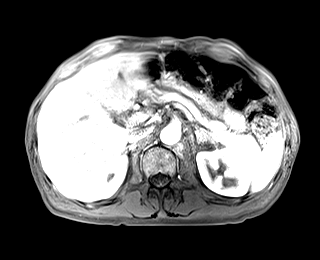
[im 80/80]
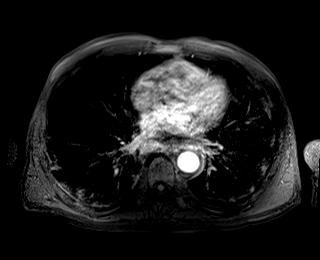

[Series 12: T1 dynamic fat-sat · axial · 3.0mm · 1.19mm/px · z∈[-120,+117]mm · 3 of 80 slices shown (2 of 4)]
[im 1/80]
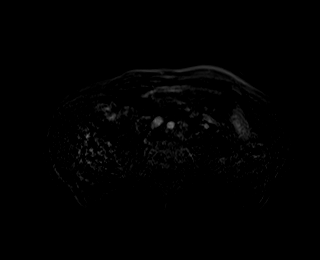
[im 40/80]
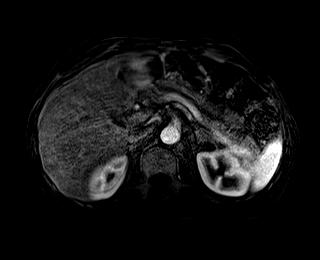
[im 80/80]
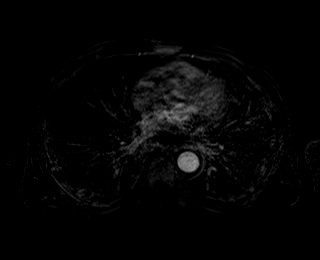

[Series 13: T1 dynamic fat-sat post-contrast · axial · 3.0mm · 1.19mm/px · z∈[-120,+117]mm · 3 of 80 slices shown (2 of 4)]
[im 1/80]
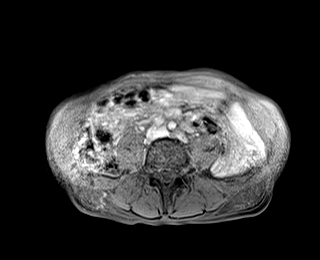
[im 40/80]
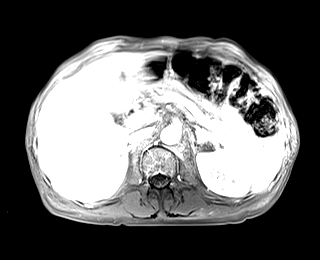
[im 80/80]
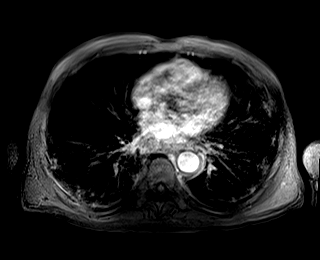

[Series 14: T1 dynamic fat-sat · axial · 3.0mm · 1.19mm/px · z∈[-120,+117]mm · 3 of 80 slices shown (3 of 4)]
[im 1/80]
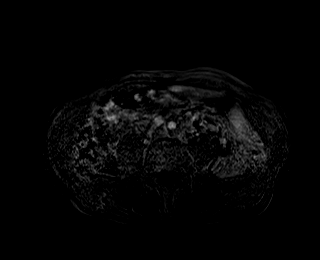
[im 40/80]
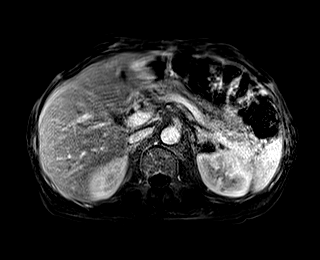
[im 80/80]
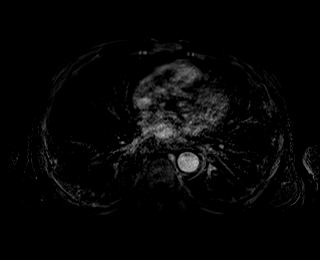

[Series 15: T1 dynamic fat-sat post-contrast · axial · 3.0mm · 1.19mm/px · z∈[-120,+117]mm · 3 of 80 slices shown (3 of 4)]
[im 1/80]
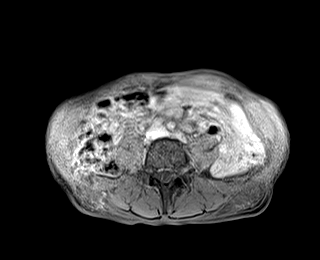
[im 40/80]
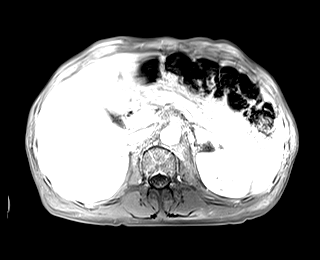
[im 80/80]
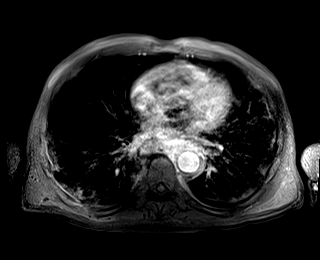

[Series 16: T1 dynamic fat-sat · axial · 3.0mm · 1.19mm/px · z∈[-120,+117]mm · 3 of 80 slices shown (4 of 4)]
[im 1/80]
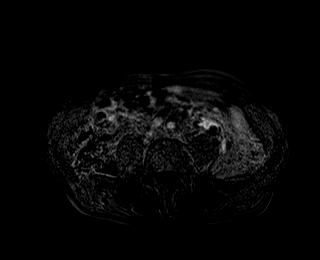
[im 40/80]
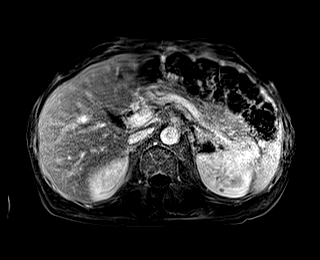
[im 80/80]
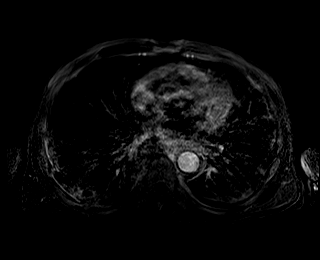

[Series 17: T1 dynamic post-contrast · coronal · 3.0mm · 1.31mm/px · 3 of 72 slices shown]
[im 1/72]
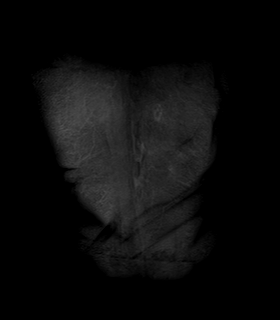
[im 36/72]
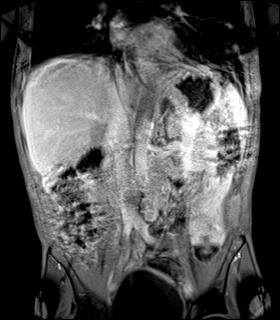
[im 72/72]
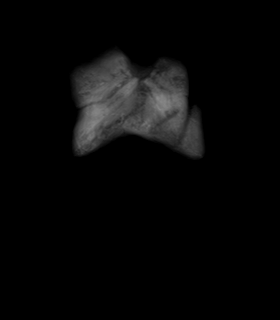

[Series 18: T1 dynamic fat-sat post-contrast · axial · 3.0mm · 1.19mm/px · z∈[-120,-3]mm · 2 of 80 slices shown (4 of 4)]
[im 1/80]
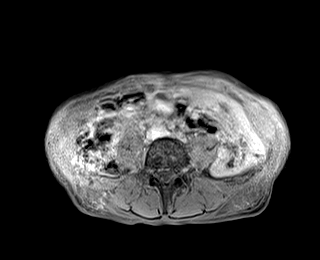
[im 40/80]
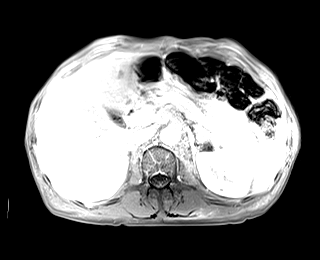

[44 of 48 positions shown; findings below may reference images not displayed]

FINDINGS: Lower chest: Basilar interstitial and airspace disease better
visualized on previous CT evaluation. No large pleural effusion. No
pericardial fluid. With limited assessment of the lung bases.
Subcarinal adenopathy also seen on the diffusion weighted sequence
best seen on image 69 of series 6 measuring approximately 15 mm.
Small left retrocrural lymph node approximately 1 cm.

Hepatobiliary: 9 mm T2 bright lesion in the right lobe. This is
within 1-2 mm of the abnormality seen in terms of the sagittal plane
on the ultrasound evaluation. No additional abnormality is
demonstrated. This corresponds with a benign hemangioma seen on CT
scan.

No pericholecystic stranding or biliary ductal dilation.

No fat or iron in the liver.

Pancreas: Pancreas with mild ductal dilation similar to previous CT
evaluations. No focal pancreatic lesion.

Spleen:  Normal size no focal suspicious lesion.

Adrenals/Urinary Tract:

Adrenal glands are normal.

No suspicious renal lesion. Cysts in the bilateral kidneys largest
on the left.

Stomach/Bowel: Stomach, visualized small and large bowel are
unremarkable. Study not protocol for bowel evaluation. Extensive gas
in the colon limits assessment.

Vascular/Lymphatic:  Vascular structures in the abdomen are patent.

Bulky retroperitoneal adenopathy worse along the left periaortic
chain, largest measuring 2.2 cm short axis (image 64 of contrasted
series.

Slightly smaller lymph nodes are seen anterior to this. A retrocaval
lymph node is also noted at 14 mm. (68, series 13)
diffusion-weighted imaging also displays these lymph nodes to best.
Subtle presumed inflammatory changes are noted about the left
diaphragmatic crus tracking about celiac and SMA. Edema seen about
the under surface of the left hemidiaphragm not seen on the right.
This is seen only on the diffusion-weighted imaging without
corresponding abnormality seen other sequences. Mild stranding
previous CT is noted this area though there is a paucity of
abdominal.

Other:  No ascites. The

Musculoskeletal: No inflammation about the spine. No destructive
bone process.
IMPRESSION: 1. Bulky retroperitoneal adenopathy worse along the left periaortic
chain, suspicious for lymphoma or metastatic disease. Lymphoma is
favored. Consider PET evaluation for further assessment as there may
be sites that would be more amenable to biopsy. This would also
encompass the chest which may be helpful.
2. Edema about the left diaphragmatic crus with thickening the left
hemidiaphragm. Findings are of uncertain significance potentially
related to lymphatic congestion or localized inflammation. Finding
seen in hind site changed previous exams. Perhaps this relates to
prior rib fractures in this location as well. These changes are more
pronounced than on the initial evaluation November 2018 where
they were not seen, seen as a subtle abnormality on most recent
comparison
3. Basilar interstitial and airspace disease better visualized on
previous CT evaluation.
4. 9 mm benign hemangioma in the right lobe of the liver.

## 2022-02-22 IMAGING — DX DG ABDOMEN 1V
1 series · 1 of 1 positions shown · non-contrast
Comparison: May 03, 2019

CLINICAL DATA: Orogastric tube placement.

EXAM:
ABDOMEN - 1 VIEW

[abdomen supine]
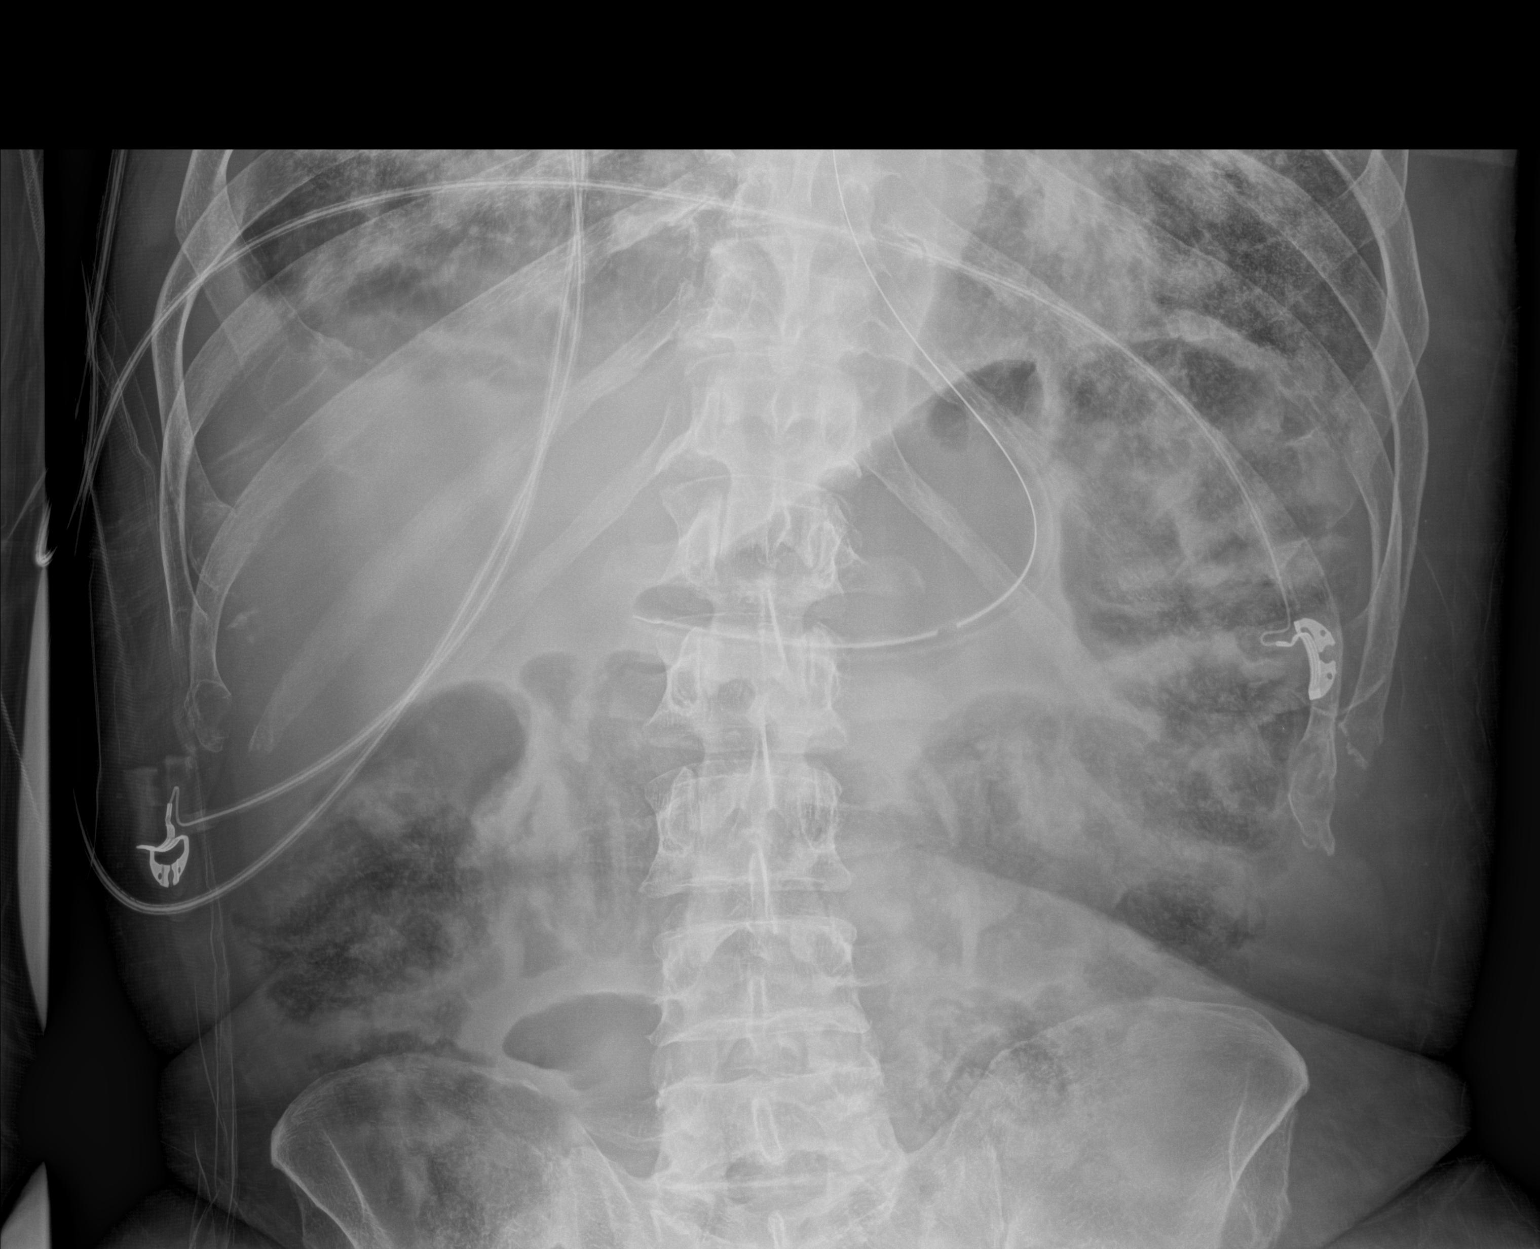

[1 of 1 positions shown; findings below may reference images not displayed]

FINDINGS: Marked severity infiltrates are seen within the visualized portions
of the bilateral lung bases. A nasogastric tube is seen with its
distal tip overlying the expected region of the gastric antrum. The
bowel gas pattern is normal. A large amount of stool is seen
throughout the colon. No radio-opaque calculi or other significant
radiographic abnormality are seen.
IMPRESSION: 1. Nasogastric tube positioning, as described above.
2. Marked severity bibasilar infiltrates.
3. Large stool burden without evidence of bowel obstruction.
